# Patient Record
Sex: Female | Born: 1985 | Race: White | Hispanic: No | Marital: Single | Smoking: Never smoker
Health system: Southern US, Community
[De-identification: ages and names within clinical notes are randomized; demographics above are authoritative.]

## PROBLEM LIST (undated history)

## (undated) ENCOUNTER — Inpatient Hospital Stay: Payer: Self-pay

## (undated) ENCOUNTER — Inpatient Hospital Stay (HOSPITAL_COMMUNITY): Payer: Self-pay

## (undated) DIAGNOSIS — R112 Nausea with vomiting, unspecified: Secondary | ICD-10-CM

## (undated) DIAGNOSIS — Z9889 Other specified postprocedural states: Secondary | ICD-10-CM

## (undated) DIAGNOSIS — F909 Attention-deficit hyperactivity disorder, unspecified type: Secondary | ICD-10-CM

## (undated) DIAGNOSIS — D649 Anemia, unspecified: Secondary | ICD-10-CM

## (undated) DIAGNOSIS — Z6731 Type AB blood, Rh negative: Secondary | ICD-10-CM

## (undated) HISTORY — PX: CHOLECYSTECTOMY: SHX55

## (undated) HISTORY — DX: Type AB blood, Rh negative: Z67.31

## (undated) HISTORY — PX: SLEEVE GASTROPLASTY: SHX1101

---
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
## 2003-03-02 ENCOUNTER — Other Ambulatory Visit: Admission: RE | Admit: 2003-03-02 | Discharge: 2003-03-02 | Payer: Self-pay | Admitting: Gynecology

## 2003-05-13 ENCOUNTER — Observation Stay (HOSPITAL_COMMUNITY): Admission: AD | Admit: 2003-05-13 | Discharge: 2003-05-14 | Payer: Self-pay | Admitting: Internal Medicine

## 2003-05-19 ENCOUNTER — Inpatient Hospital Stay (HOSPITAL_COMMUNITY): Admission: AD | Admit: 2003-05-19 | Discharge: 2003-05-19 | Payer: Self-pay | Admitting: Gynecology

## 2004-06-10 ENCOUNTER — Inpatient Hospital Stay (HOSPITAL_COMMUNITY): Admission: AD | Admit: 2004-06-10 | Discharge: 2004-06-10 | Payer: Self-pay | Admitting: Obstetrics & Gynecology

## 2004-06-17 ENCOUNTER — Emergency Department (HOSPITAL_COMMUNITY): Admission: EM | Admit: 2004-06-17 | Discharge: 2004-06-17 | Payer: Self-pay | Admitting: Family Medicine

## 2004-07-09 ENCOUNTER — Other Ambulatory Visit: Admission: RE | Admit: 2004-07-09 | Discharge: 2004-07-09 | Payer: Self-pay | Admitting: Gynecology

## 2004-11-22 ENCOUNTER — Inpatient Hospital Stay (HOSPITAL_COMMUNITY): Admission: AD | Admit: 2004-11-22 | Discharge: 2004-11-22 | Payer: Self-pay | Admitting: Gynecology

## 2004-12-17 ENCOUNTER — Inpatient Hospital Stay (HOSPITAL_COMMUNITY): Admission: AD | Admit: 2004-12-17 | Discharge: 2004-12-19 | Payer: Self-pay | Admitting: Gynecology

## 2005-01-11 HISTORY — PX: CHOLECYSTECTOMY: SHX55

## 2005-01-11 HISTORY — PX: GALLBLADDER SURGERY: SHX652

## 2005-01-28 ENCOUNTER — Encounter (INDEPENDENT_AMBULATORY_CARE_PROVIDER_SITE_OTHER): Payer: Self-pay | Admitting: Specialist

## 2005-01-28 ENCOUNTER — Inpatient Hospital Stay (HOSPITAL_COMMUNITY): Admission: EM | Admit: 2005-01-28 | Discharge: 2005-01-29 | Payer: Self-pay | Admitting: Emergency Medicine

## 2005-02-03 ENCOUNTER — Other Ambulatory Visit: Admission: RE | Admit: 2005-02-03 | Discharge: 2005-02-03 | Payer: Self-pay | Admitting: Gynecology

## 2005-04-14 ENCOUNTER — Other Ambulatory Visit: Admission: RE | Admit: 2005-04-14 | Discharge: 2005-04-14 | Payer: Self-pay | Admitting: Gynecology

## 2006-07-16 IMAGING — US US OB COMP LESS 14 WK
1 series · 14 of 28 positions shown · non-contrast
Comparison: none

CLINICAL DATA: 14 week 2 day gestational age by LMP.  Pelvic pain and cramping.  
OBSTETRICAL ULTRASOUND:  
A single living intrauterine fetus is seen with measured heart rate of 160.  Fetal movement is noted.  Crown rump length measures 5.2 cm, corresponding with a gestational age of 11 weeks 6 days.  A normal appearing yolk sac is seen.  
No maternal uterine abnormalities are seen.  The right ovary is normal in appearance.  The left ovary contains a hemorrhagic corpus luteum cyst measuring 2.7 x 1.8 x 2.4 cm.  There is no evidence of free fluid.

[Series 1: us ob comp less 14 wk · 0.20mm/px · 14 of 48 slices shown]
[im 2/48]
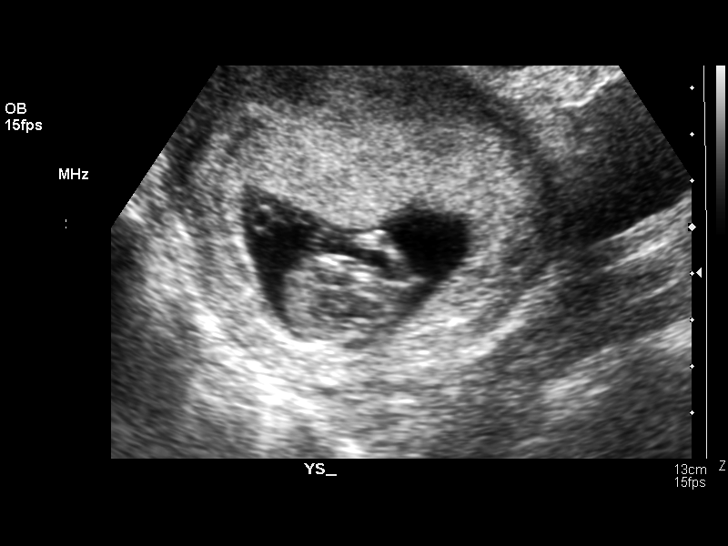
[im 6/48]
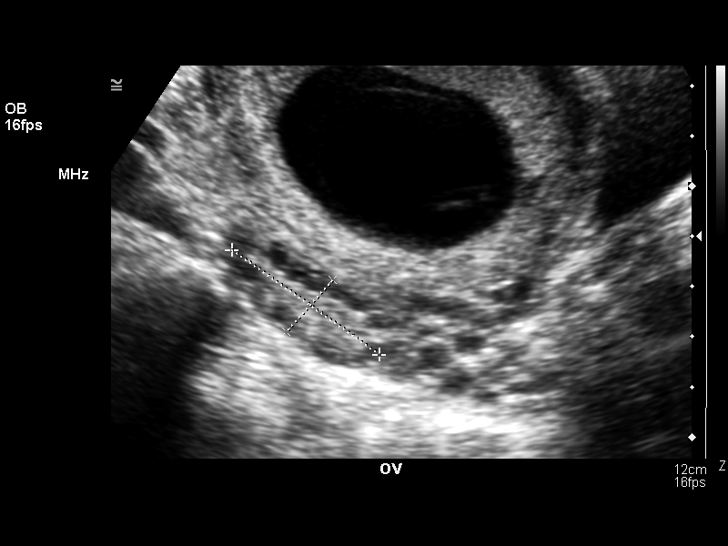
[im 9/48]
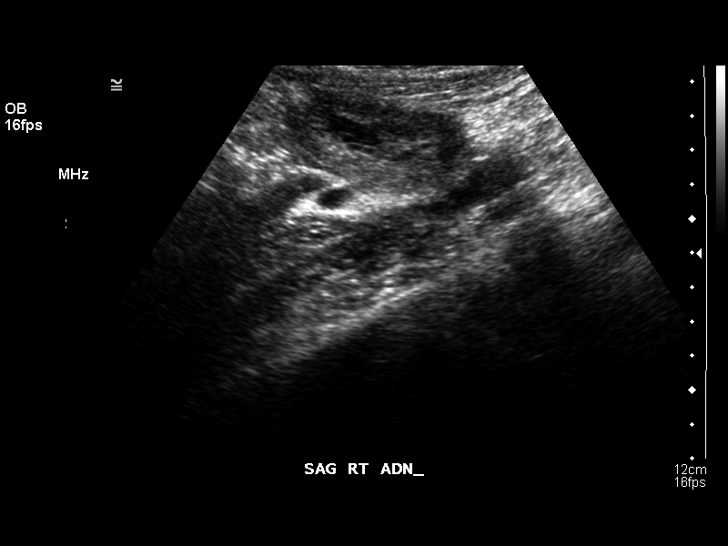
[im 13/48]
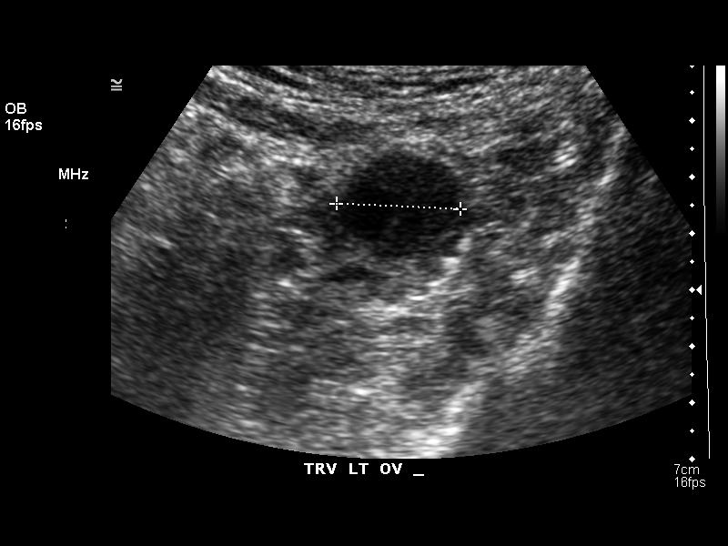
[im 16/48]
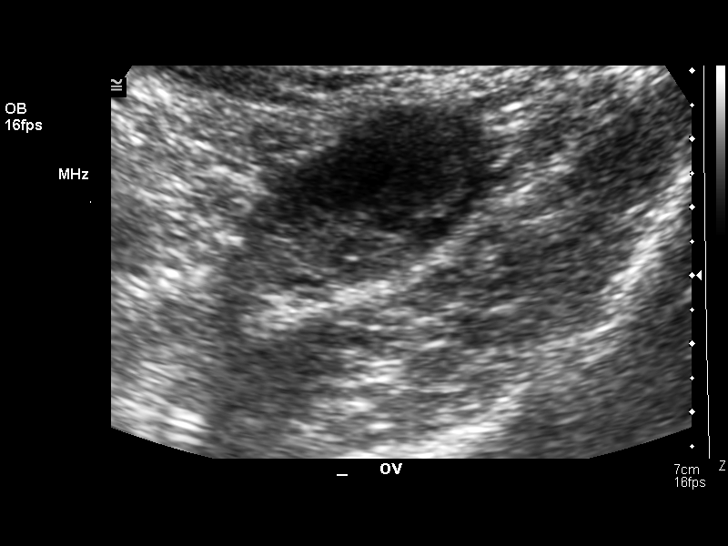
[im 20/48]
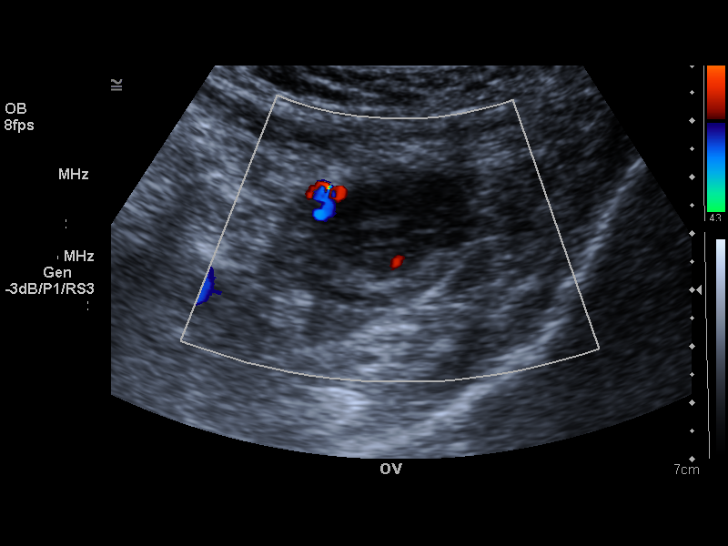
[im 23/48]
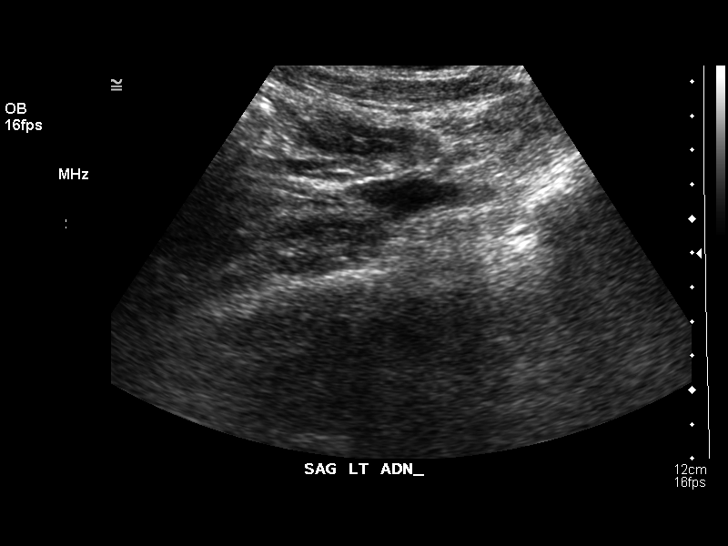
[im 27/48]
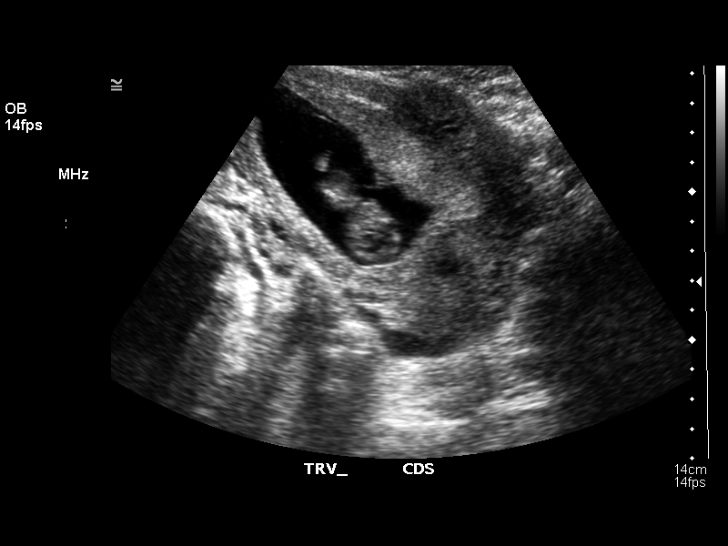
[im 30/48]
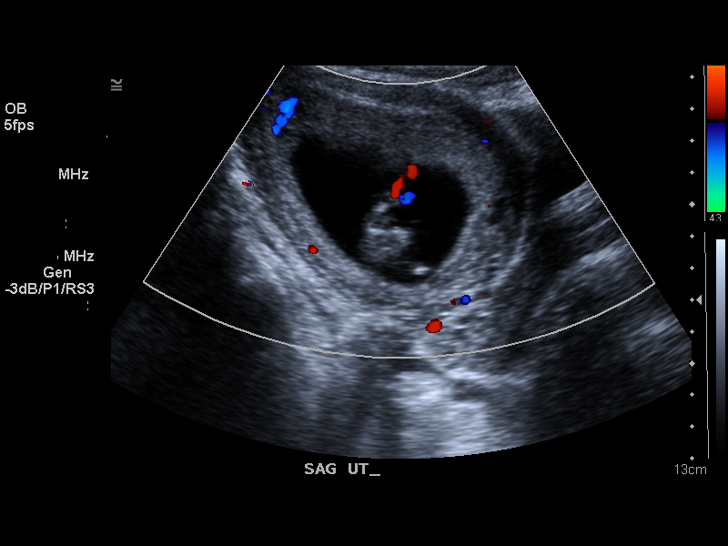
[im 34/48]
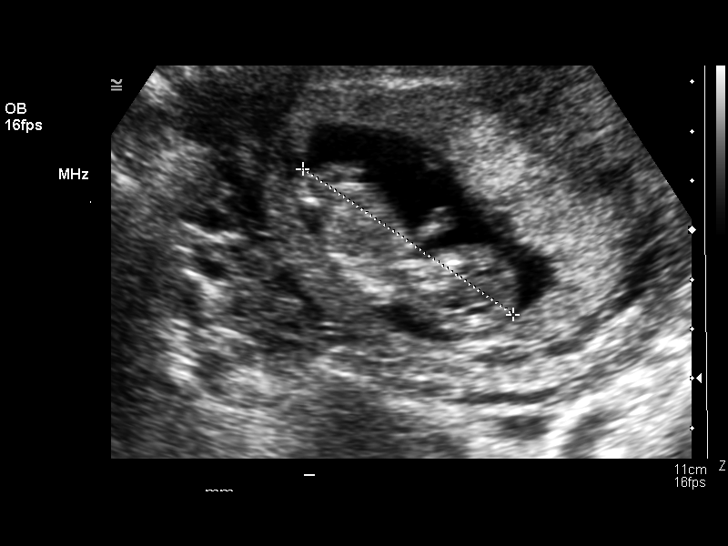
[im 37/48]
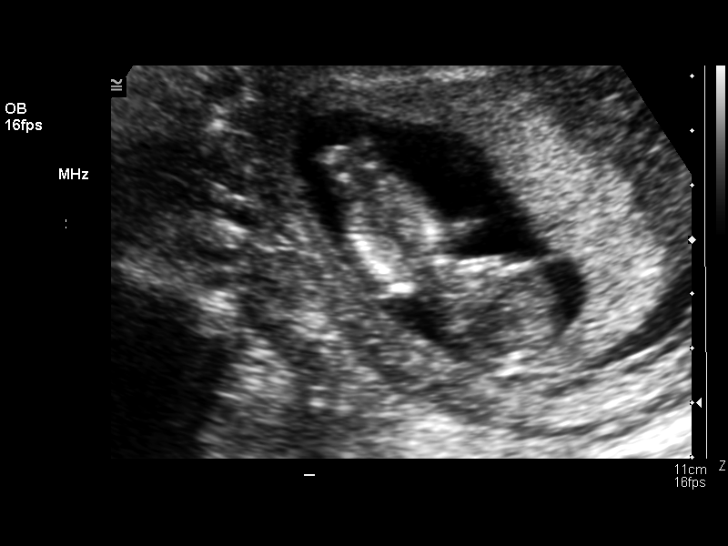
[im 41/48]
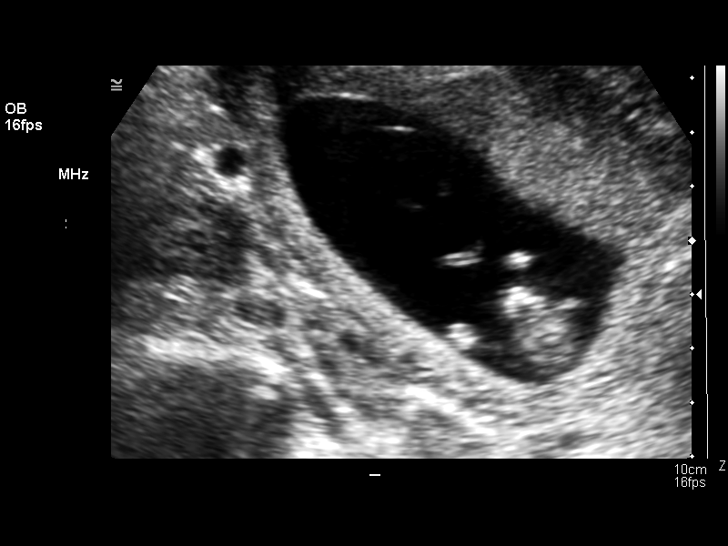
[im 44/48]
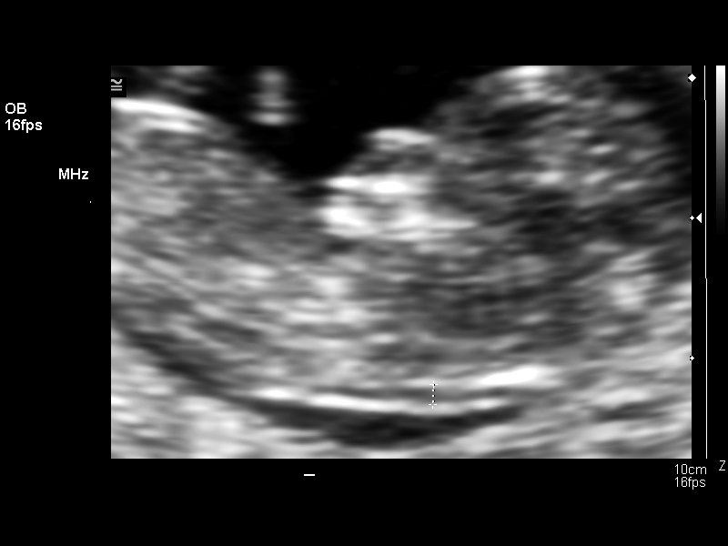
[im 48/48]
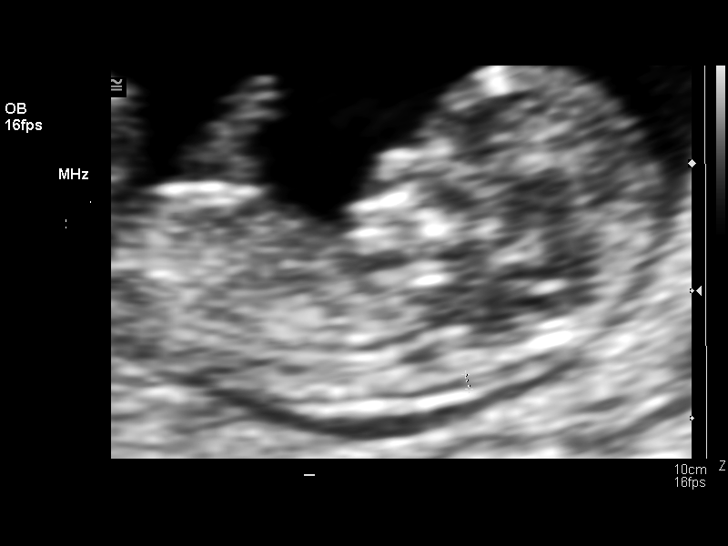

[14 of 28 positions shown; findings below may reference images not displayed]

IMPRESSION: Single living intrauterine fetus with estimated gestational age of 11 weeks 6 days and sonographic EDC of 12/24/04.  This is approximately two weeks less than LMP dates.  
2.7 cm hemorrhagic left ovarian corpus luteum.  No evidence of free fluid.

## 2006-09-25 ENCOUNTER — Emergency Department (HOSPITAL_COMMUNITY): Admission: EM | Admit: 2006-09-25 | Discharge: 2006-09-26 | Payer: Self-pay | Admitting: Emergency Medicine

## 2008-01-28 ENCOUNTER — Emergency Department (HOSPITAL_COMMUNITY): Admission: EM | Admit: 2008-01-28 | Discharge: 2008-01-28 | Payer: Self-pay | Admitting: Emergency Medicine

## 2008-03-18 ENCOUNTER — Inpatient Hospital Stay (HOSPITAL_COMMUNITY): Admission: AD | Admit: 2008-03-18 | Discharge: 2008-03-19 | Payer: Self-pay | Admitting: Family Medicine

## 2008-04-12 ENCOUNTER — Inpatient Hospital Stay (HOSPITAL_COMMUNITY): Admission: AD | Admit: 2008-04-12 | Discharge: 2008-04-12 | Payer: Self-pay | Admitting: Obstetrics & Gynecology

## 2009-04-08 ENCOUNTER — Emergency Department (HOSPITAL_COMMUNITY): Admission: EM | Admit: 2009-04-08 | Discharge: 2009-04-08 | Payer: Self-pay | Admitting: Emergency Medicine

## 2009-07-02 ENCOUNTER — Emergency Department (HOSPITAL_COMMUNITY): Admission: EM | Admit: 2009-07-02 | Discharge: 2009-07-02 | Payer: Self-pay | Admitting: Family Medicine

## 2009-08-31 ENCOUNTER — Emergency Department (HOSPITAL_COMMUNITY): Admission: EM | Admit: 2009-08-31 | Discharge: 2009-08-31 | Payer: Self-pay | Admitting: Emergency Medicine

## 2010-05-22 ENCOUNTER — Other Ambulatory Visit: Admission: RE | Admit: 2010-05-22 | Discharge: 2010-05-22 | Payer: Self-pay | Admitting: Gynecology

## 2010-05-22 ENCOUNTER — Ambulatory Visit: Payer: Self-pay | Admitting: Women's Health

## 2010-08-05 ENCOUNTER — Inpatient Hospital Stay (HOSPITAL_COMMUNITY)
Admission: AD | Admit: 2010-08-05 | Discharge: 2010-08-05 | Payer: Self-pay | Source: Home / Self Care | Attending: Obstetrics & Gynecology | Admitting: Obstetrics & Gynecology

## 2010-08-06 LAB — URINALYSIS, ROUTINE W REFLEX MICROSCOPIC
Bilirubin Urine: NEGATIVE
Ketones, ur: NEGATIVE mg/dL
Nitrite: NEGATIVE
Protein, ur: NEGATIVE mg/dL
Specific Gravity, Urine: 1.015 (ref 1.005–1.030)
Urine Glucose, Fasting: NEGATIVE mg/dL
Urobilinogen, UA: 0.2 mg/dL (ref 0.0–1.0)
pH: 5 (ref 5.0–8.0)

## 2010-08-06 LAB — URINE MICROSCOPIC-ADD ON

## 2010-08-06 LAB — POCT PREGNANCY, URINE: Preg Test, Ur: NEGATIVE

## 2010-08-06 LAB — CBC
HCT: 39.3 % (ref 36.0–46.0)
Hemoglobin: 13.5 g/dL (ref 12.0–15.0)
MCH: 29.9 pg (ref 26.0–34.0)
MCHC: 34.4 g/dL (ref 30.0–36.0)
MCV: 87.1 fL (ref 78.0–100.0)
Platelets: 182 10*3/uL (ref 150–400)
RBC: 4.51 MIL/uL (ref 3.87–5.11)
RDW: 13 % (ref 11.5–15.5)
WBC: 5.8 10*3/uL (ref 4.0–10.5)

## 2010-08-06 LAB — BASIC METABOLIC PANEL WITH GFR
BUN: 7 mg/dL (ref 6–23)
CO2: 28 meq/L (ref 19–32)
Calcium: 9.6 mg/dL (ref 8.4–10.5)
Chloride: 104 meq/L (ref 96–112)
Creatinine, Ser: 0.84 mg/dL (ref 0.4–1.2)
GFR calc non Af Amer: >60 mL/min
Glucose, Bld: 91 mg/dL (ref 70–99)
Potassium: 4.3 meq/L (ref 3.5–5.1)
Sodium: 138 meq/L (ref 135–145)

## 2010-08-06 LAB — WET PREP, GENITAL
Trich, Wet Prep: NONE SEEN
Yeast Wet Prep HPF POC: NONE SEEN

## 2010-08-06 LAB — HCG, QUANTITATIVE, PREGNANCY: hCG, Beta Chain, Quant, S: <2 m[IU]/mL (ref <5)

## 2010-08-07 LAB — GC/CHLAMYDIA PROBE AMP, GENITAL
Chlamydia, DNA Probe: NEGATIVE
GC Probe Amp, Genital: NEGATIVE

## 2010-10-14 LAB — POCT PREGNANCY, URINE: Preg Test, Ur: NEGATIVE

## 2010-11-29 NOTE — Discharge Summary (Signed)
NAME:  Paula Sullivan, Paula Sullivan                           ACCOUNT NO.:  [id number]   MEDICAL RECORD NO.:  [id number]                   PATIENT TYPE:  OBV   LOCATION:  9151                                 FACILITY:  [facility]   PHYSICIAN:  Juan H. Lily Peer, M.D.             DATE OF BIRTH:  1986/04/12   DATE OF ADMISSION:  05/13/2003  DATE OF DISCHARGE:  05/14/2003                                 DISCHARGE SUMMARY   DISCHARGE DIAGNOSES:  1. Intrauterine pregnancy 26+ weeks, undelivered.  2. Fall.   HISTORY:  This is a 25 year old female gravida 1, para 0 with an EDC of  August 16, 2003 who presented to triage after falling nine steps and landed  on her abdomen on date of admission.   HOSPITAL COURSE:  On May 13, 2003 patient was assessed for 26 weeks at  fall.  CMET within normal limits.  UA within normal limits.  Ultrasound  revealed IUP, no abruption, cervical length 3.2 cm, AFI within normal  limits, breech position.  Kleihauer-Betke was negative.  The patient was  admitted and placed on continuous monitoring for contractions.  On the  following a.m. Kleihauer-Betke again was repeated which was negative.  Ultrasound was stable and patient was felt at that time stable for discharge  and follow-up with routine OB care.  If she had any problems prior to this  follow-up visit, obviously, she is to call the office.     Susa Loffler, P.A.                    Juan H. Lily Peer, M.D.    TSG/MEDQ  D:  06/05/2003  T:  06/05/2003  Job:  [id number]
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
## 2010-11-29 NOTE — Discharge Summary (Signed)
[name], [name]                 ACCOUNT NO.:  [id number]   MEDICAL RECORD NO.:  [id number]          PATIENT TYPE:  INP   LOCATION:  6153                         FACILITY:  [facility]   PHYSICIAN:  Thornton Park. Daphine Deutscher, MD  DATE OF BIRTH:  [birth year]   DATE OF ADMISSION:  01/28/2005  DATE OF DISCHARGE:  01/29/2005                                 DISCHARGE SUMMARY   DISCHARGE DIAGNOSES:  1.  Acute cholecystitis, status post laparoscopic cholecystectomy on January 28, 2005.  2.  Five weeks postpartum with natural delivery.   HOSPITAL COURSE:  [name] is a 25 year old female who is 5 weeks  postpartum who awoke at 2 a.m. on the day of admission with right upper  quadrant pain radiating into the right scapular region.  She denies any  previous pain, no nausea, vomiting or diarrhea.  She was seen in the  emergency room and her white count was normal, however, gallbladder  ultrasound revealed cholelithiasis with positive Murphy sign and scan was  consistent with acute cholecystitis.   She then underwent a laparoscopic cholecystectomy under the care of Dr. Wenda Low.  She tolerated the procedure well and was taken back to the room in  stable condition.  On postop day #1, she was ready for discharge to home.  She was eating, urinating and able to ambulate without difficulty.   DISCHARGE MEDICATIONS:  Vicodin as needed for pain.   FOLLOW UP:  Follow up with Dr. Daphine Deutscher in 2 weeks.   ACTIVITY:  She is not to lift over 10 pounds x2 weeks.  She may  shower/bathe.   SPECIAL INSTRUCTIONS:  She is to call for any questions or concerns.      Guy Franco, P.A.      Thornton Park Daphine Deutscher, MD  Electronically Signed    LB/MEDQ  D:  04/11/2005  T:  04/11/2005  Job:  [id number]   cc:   Ivor Costa. Farrel Gobble, M.D.  Fax: [phone]

## 2010-11-29 NOTE — H&P (Signed)
Paula Sullivan, Paula Sullivan                 ACCOUNT NO.:  [id number]   MEDICAL RECORD NO.:  [id number]          PATIENT TYPE:  INP   LOCATION:  1827                         FACILITY:  [facility]   PHYSICIAN:  Thornton Park. Daphine Deutscher, MD  DATE OF BIRTH:  07/30/1985   DATE OF ADMISSION:  01/28/2005  DATE OF DISCHARGE:                                HISTORY & PHYSICAL   CHIEF COMPLAINT:  Right upper quadrant pain.   Paula Sullivan is a 25 year old female who is five weeks postpartum who awoke at  2 a.m. today with right upper quadrant pain radiating into the right  scapular region.  She has not had pain similar to this prior to this  admission.  She denies any nausea, vomiting, diarrhea, fever, or chills.  Several weeks ago she did experience nausea, vomiting, diarrhea which sounds  like gastroenteritis because her whole family experienced the same illness.  Please note, she is five weeks postpartum.  She had a natural delivery and  is not breast-feeding.  She is currently on her menses.   FAMILY HISTORY:  Mom and dad are both alive and both have diabetes mellitus.   REVIEW OF SYSTEMS:  As above, otherwise negative.   PHYSICAL EXAMINATION:  VITAL SIGNS:  Temperature 98.1, pulse 60,  respirations 18, blood pressure 102/77.  HEENT:  Grossly normal.  NECK:  No carotid or subclavian bruits, JVD, or thyromegaly.  CHEST:  Clear to auscultation bilaterally.  No wheezing or rhonchi.  HEART:  Regular rate and rhythm.  No gross murmur.  ABDOMEN:  Right upper quadrant with positive Murphy's sign.  EXTREMITIES:  No peripheral edema.  SKIN:  Warm and dry.   LABORATORIES/X-RAY:  White count is 5.3.  BMET is unremarkable.  Lipase is  59.  Urinalysis shows hematuria but she is currently on her menses.  Ultrasound shows cholelithiasis with a positive Murphy's sign consistent  with cholelithiasis.   ASSESSMENT:  Acute cholecystitis.   PLAN:  Laparoscopic cholecystectomy with intraoperative cholangiogram per  Dr.  Luretha Murphy.  The patient will be taken to the operating room today.       LB/MEDQ  D:  01/28/2005  T:  01/28/2005  Job:  [id number]

## 2010-11-29 NOTE — Op Note (Signed)
NAMECLAUDINA, OLIPHANT                 ACCOUNT NO.:  [id number]   MEDICAL RECORD NO.:  [id number]          PATIENT TYPE:  INP   LOCATION:  6153                         FACILITY:  [facility]   PHYSICIAN:  Thornton Park. Daphine Deutscher, MD  DATE OF BIRTH:  1986-04-22   DATE OF PROCEDURE:  01/28/2005  DATE OF DISCHARGE:                                 OPERATIVE REPORT   PREOPERATIVE DIAGNOSIS:  Acute cholecystitis.   POSTOPERATIVE DIAGNOSIS:  Acute and subacute cholecystitis.   PROCEDURE:  Laparoscopic cholecystectomy, intraoperative cholangiogram.   SURGEON:  Thornton Park. Daphine Deutscher, M.D.   ASSISTANT:  Sandria Bales. Ezzard Standing, M.D.   ANESTHESIA:  General endotracheal.   DESCRIPTION OF PROCEDURE:  Nimco Bivens is a 25 year old, recent postpartum,  with abdominal pain and gallstones.  She was taken to OR 5 on the afternoon  of January 28, 2005 and given general anesthesia.  The area was prepped with  Betadine and draped sterilely.  Informed consent was obtained preop  regarding complications not limited to common bile duct injury.  Through the  umbilicus, an Hasson was inserted without difficulty and the abdomen was  insufflated.  Three trocars were placed in the upper abdomen.  The  gallbladder was densely adherent to the omentum, which walled it off.  I had  to strip this away and use a harmonic for taking this down.  I then the  grasped the gallbladder, elevated it, carefully dissected Calot's triangle,  put a clip upon the gallbladder, incised the cystic duct and had a little  difficulty getting into it, as there was a valve, but I eventually got into  it.  A dynamic cholangiogram showed no really prompt flow into the duodenum,  but good filling of the intrahepatic radicals and no obvious stones were  seen.  There was a right hepatic branch that seemed to come off with the  cystic duct.  I then triple-clipped the cystic duct, divided it and triple-  clipped the cystic artery divided it and removed the gallbladder  from the  gallbladder bed with the Harmonic scalpel.  It was placed in a bag and  brought out through the umbilicus.  The gallbladder bed was inspected and no  bleeding was noted.  All the ports were injected with  0.5% Marcaine.  The  umbilical port was repaired using the pursestring suture was tied down and  occluded that.  The wounds were closed with 4-0 Vicryl, Benzoin and Steri-  Strips.  The patient seemed to tolerate the procedure well and was taken to  the recovery room in satisfactory condition.       MBM/MEDQ  D:  01/28/2005  T:  01/29/2005  Job:  [id number]
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
## 2011-03-20 ENCOUNTER — Emergency Department: Payer: Self-pay | Admitting: Emergency Medicine

## 2011-04-14 LAB — URINALYSIS, ROUTINE W REFLEX MICROSCOPIC
Glucose, UA: NEGATIVE
Hgb urine dipstick: NEGATIVE
Ketones, ur: NEGATIVE
Nitrite: NEGATIVE
Protein, ur: NEGATIVE
Specific Gravity, Urine: 1.025
Urobilinogen, UA: 0.2
pH: 6

## 2011-04-14 LAB — URINE MICROSCOPIC-ADD ON

## 2011-04-14 LAB — WET PREP, GENITAL: Yeast Wet Prep HPF POC: NONE SEEN

## 2011-04-14 LAB — URINE CULTURE: Culture: NO GROWTH

## 2011-04-14 LAB — GC/CHLAMYDIA PROBE AMP, GENITAL
Chlamydia, DNA Probe: POSITIVE — AB
GC Probe Amp, Genital: NEGATIVE

## 2011-04-16 LAB — WET PREP, GENITAL
Trich, Wet Prep: NONE SEEN
Yeast Wet Prep HPF POC: NONE SEEN

## 2011-04-16 LAB — URINALYSIS, ROUTINE W REFLEX MICROSCOPIC
Bilirubin Urine: NEGATIVE
Glucose, UA: NEGATIVE
Hgb urine dipstick: NEGATIVE
Ketones, ur: NEGATIVE
Nitrite: NEGATIVE
Protein, ur: NEGATIVE
Specific Gravity, Urine: >1.03 — ABNORMAL HIGH
Urobilinogen, UA: 0.2
pH: 5.5

## 2011-04-16 LAB — CBC
HCT: 41.1
Hemoglobin: 14.1
MCHC: 34.2
MCV: 91.7
Platelets: 215
RBC: 4.48
RDW: 12.4
WBC: 8.1

## 2011-04-16 LAB — HCG, QUANTITATIVE, PREGNANCY: hCG, Beta Chain, Quant, S: 79 — ABNORMAL HIGH

## 2011-10-23 ENCOUNTER — Encounter: Payer: Self-pay | Admitting: Women's Health

## 2011-11-05 ENCOUNTER — Other Ambulatory Visit: Payer: Self-pay

## 2013-04-12 ENCOUNTER — Encounter: Payer: Self-pay | Admitting: Gastroenterology

## 2013-04-29 ENCOUNTER — Ambulatory Visit: Payer: Self-pay | Admitting: Gastroenterology

## 2013-09-16 ENCOUNTER — Ambulatory Visit: Payer: Self-pay | Admitting: Physician Assistant

## 2013-09-21 ENCOUNTER — Encounter (HOSPITAL_COMMUNITY): Payer: Self-pay | Admitting: Emergency Medicine

## 2013-09-21 ENCOUNTER — Emergency Department (HOSPITAL_COMMUNITY)
Admission: EM | Admit: 2013-09-21 | Discharge: 2013-09-21 | Disposition: A | Discharge status: Home / Self Care | Payer: No Typology Code available for payment source | Attending: Emergency Medicine | Admitting: Emergency Medicine

## 2013-09-21 DIAGNOSIS — R0789 Other chest pain: Secondary | ICD-10-CM | POA: Insufficient documentation

## 2013-09-21 DIAGNOSIS — IMO0001 Reserved for inherently not codable concepts without codable children: Secondary | ICD-10-CM | POA: Insufficient documentation

## 2013-09-21 DIAGNOSIS — R51 Headache: Secondary | ICD-10-CM | POA: Insufficient documentation

## 2013-09-21 DIAGNOSIS — R509 Fever, unspecified: Secondary | ICD-10-CM | POA: Insufficient documentation

## 2013-09-21 DIAGNOSIS — R0602 Shortness of breath: Secondary | ICD-10-CM | POA: Insufficient documentation

## 2013-09-21 DIAGNOSIS — Z880 Allergy status to penicillin: Secondary | ICD-10-CM | POA: Insufficient documentation

## 2013-09-21 DIAGNOSIS — R059 Cough, unspecified: Secondary | ICD-10-CM | POA: Insufficient documentation

## 2013-09-21 DIAGNOSIS — R0989 Other specified symptoms and signs involving the circulatory and respiratory systems: Secondary | ICD-10-CM | POA: Insufficient documentation

## 2013-09-21 DIAGNOSIS — R05 Cough: Secondary | ICD-10-CM | POA: Insufficient documentation

## 2013-09-21 DIAGNOSIS — H9209 Otalgia, unspecified ear: Secondary | ICD-10-CM | POA: Insufficient documentation

## 2013-09-21 DIAGNOSIS — J391 Other abscess of pharynx: Secondary | ICD-10-CM | POA: Insufficient documentation

## 2013-09-21 DIAGNOSIS — R110 Nausea: Secondary | ICD-10-CM | POA: Insufficient documentation

## 2013-09-21 DIAGNOSIS — R197 Diarrhea, unspecified: Secondary | ICD-10-CM | POA: Insufficient documentation

## 2013-09-21 DIAGNOSIS — Z91040 Latex allergy status: Secondary | ICD-10-CM | POA: Insufficient documentation

## 2013-09-21 DIAGNOSIS — J069 Acute upper respiratory infection, unspecified: Secondary | ICD-10-CM

## 2013-09-21 DIAGNOSIS — R42 Dizziness and giddiness: Secondary | ICD-10-CM | POA: Insufficient documentation

## 2013-09-21 MED ORDER — ALBUTEROL SULFATE HFA 108 (90 BASE) MCG/ACT IN AERS
2.0000 | INHALATION_SPRAY | RESPIRATORY_TRACT | Status: DC | PRN
  Administered 2013-09-21: 2 via RESPIRATORY_TRACT
  Filled 2013-09-21: qty 6.7

## 2013-09-21 NOTE — ED Notes (Signed)
Pt alert, arrives from home, c/o re current sinus infection, recently treated with z pack, no relief, resp even unlabored, skin pwd

## 2013-09-21 NOTE — ED Provider Notes (Signed)
CSN: [id number]     Arrival date & time 09/21/13  1307 History  This chart was scribed for non-physician practitioner Elpidio AnisShari Jessamine Barcia working with Rolland PorterMark James, MD by Elveria Risingimelie Horne, ED Scribe. This patient was seen in room WTR7/WTR7 and the patient's care was started at 1:46 PM.   Chief Complaint  Patient presents with  . URI     (Consider location/radiation/quality/duration/timing/severity/associated sxs/prior Treatment) The history is provided by the patient. No language interpreter was used.  HPI Comments: Paula Sullivan is a 28 y.o. female who presents to the Emergency Department complaining of URI symptoms that presented as sinus pressure and has gradually worsened. The patient reports nonproductive cough, chest and nasal congestion which has endured for 4 weeks. A week ago, the patient visited her doctor and was diagnosed with sinusitis and given a Z pack. The patient finished the Z pack 09/14/13, but reports that her symptoms have worsened. Patient reports intermittent fever which was measured at 102F at its maximum, and has remained between 99-101F throughout the week. Today the patient reports dry, burning cough, headache, lightheadedness, chest tightness, myalgia, diarrhea, mild ear pain, and some nausea. Patient reports that in addition to her prescribed Z-pack she has only treated her symptoms with NyQuil to allow her to sleep at night. Patient denies vomiting, constipation and sore throat. Patient shares that she has been eating and drinking normally.   Past Medical History  Diagnosis Date  . Type ab blood, rh negative    Past Surgical History  Procedure Laterality Date  . Gallbladder surgery  7-06   Family History  Problem Relation Age of Onset  . Suicidality Father     2011  . Diabetes Maternal Grandfather    History  Substance Use Topics  . Smoking status: Never Smoker   . Smokeless tobacco: Not on file  . Alcohol Use: Yes     Comment: SOCIALLY   OB History   Grav Para  Term Preterm Abortions TAB SAB Ect Mult Living   2 2             Review of Systems  Constitutional: Positive for fever.  HENT: Positive for congestion, ear pain and sinus pressure. Negative for sore throat.   Respiratory: Positive for cough, chest tightness and shortness of breath.   Gastrointestinal: Positive for nausea and diarrhea. Negative for vomiting and constipation.  Musculoskeletal: Positive for myalgias.  Neurological: Positive for light-headedness and headaches.      Allergies  Latex and Penicillins  Home Medications   Current Outpatient Rx  Name  Route  Sig  Dispense  Refill  . amphetamine-dextroamphetamine (ADDERALL XR) 20 MG 24 hr capsule   Oral   Take 20 mg by mouth daily.          BP 139/87  Pulse 82  Temp(Src) 99.4 F (37.4 C) (Oral)  Resp 16  Wt 200 lb (90.719 kg)  SpO2 99%  LMP 08/24/2013 Physical Exam  Nursing note and vitals reviewed. Constitutional: She is oriented to person, place, and time. She appears well-developed and well-nourished. No distress.  HENT:  Head: Normocephalic and atraumatic.  Bogginess to frontal and maxillary sinuses. Pressure felt with palpation.   Eyes: EOM are normal.  Neck: Neck supple. No tracheal deviation present.  Cardiovascular: Normal rate.   Pulmonary/Chest: Effort normal. No respiratory distress.  Musculoskeletal: Normal range of motion.  Neurological: She is alert and oriented to person, place, and time.  Skin: Skin is warm and dry.  Psychiatric: She has  a normal mood and affect. Her behavior is normal.    ED Course  Procedures (including critical care time) DIAGNOSTIC STUDIES: Oxygen Saturation is 99% on room air, normal by my interpretation.    COORDINATION OF CARE: 1:52 PM- Pt advised of plan for treatment and pt agrees.    Labs Review Labs Reviewed - No data to display Imaging Review No results found.   EKG Interpretation None      MDM   Final diagnoses:  None    1. Cough  She  appears stable, well, non-toxic. Suspect viral process given recent Z-pack and normal exam. Stable for discharge.   I personally performed the services described in this documentation, which was scribed in my presence. The recorded information has been reviewed and is accurate.     Arnoldo Hooker, PA-C 09/24/13 [id number]

## 2013-09-21 NOTE — Discharge Instructions (Signed)
Cough, Adult ° A cough is a reflex that helps clear your throat and airways. It can help heal the body or may be a reaction to an irritated airway. A cough may only last 2 or 3 weeks (acute) or may last more than 8 weeks (chronic).  °CAUSES °Acute cough: °· Viral or bacterial infections. °Chronic cough: °· Infections. °· Allergies. °· Asthma. °· Post-nasal drip. °· Smoking. °· Heartburn or acid reflux. °· Some medicines. °· Chronic lung problems (COPD). °· Cancer. °SYMPTOMS  °· Cough. °· Fever. °· Chest pain. °· Increased breathing rate. °· High-pitched whistling sound when breathing (wheezing). °· Colored mucus that you cough up (sputum). °TREATMENT  °· A bacterial cough may be treated with antibiotic medicine. °· A viral cough must run its course and will not respond to antibiotics. °· Your caregiver may recommend other treatments if you have a chronic cough. °HOME CARE INSTRUCTIONS  °· Only take over-the-counter or prescription medicines for pain, discomfort, or fever as directed by your caregiver. Use cough suppressants only as directed by your caregiver. °· Use a cold steam vaporizer or humidifier in your bedroom or home to help loosen secretions. °· Sleep in a semi-upright position if your cough is worse at night. °· Rest as needed. °· Stop smoking if you smoke. °SEEK IMMEDIATE MEDICAL CARE IF:  °· You have pus in your sputum. °· Your cough starts to worsen. °· You cannot control your cough with suppressants and are losing sleep. °· You begin coughing up blood. °· You have difficulty breathing. °· You develop pain which is getting worse or is uncontrolled with medicine. °· You have a fever. °MAKE SURE YOU:  °· Understand these instructions. °· Will watch your condition. °· Will get help right away if you are not doing well or get worse. °Document Released: 12/27/2010 Document Revised: 09/22/2011 Document Reviewed: 12/27/2010 °Exit[employer]® Patient Information ©2014 [employer]. ° °Upper Respiratory Infection,  Adult °An upper respiratory infection (URI) is also known as the common cold. It is often caused by a type of germ (virus). Colds are easily spread (contagious). You can pass it to others by kissing, coughing, sneezing, or drinking out of the same glass. Usually, you get better in 1 or 2 weeks.  °HOME CARE  °· Only take medicine as told by your doctor. °· Use a warm mist humidifier or breathe in steam from a hot shower. °· Drink enough water and fluids to keep your pee (urine) clear or pale yellow. °· Get plenty of rest. °· Return to work when your temperature is back to normal or as told by your doctor. You may use a face mask and wash your hands to stop your cold from spreading. °GET HELP RIGHT AWAY IF:  °· After the first few days, you feel you are getting worse. °· You have questions about your medicine. °· You have chills, shortness of breath, or brown or red spit (mucus). °· You have yellow or brown snot (nasal discharge) or pain in the face, especially when you bend forward. °· You have a fever, puffy (swollen) neck, pain when you swallow, or white spots in the back of your throat. °· You have a bad headache, ear pain, sinus pain, or chest pain. °· You have a high-pitched whistling sound when you breathe in and out (wheezing). °· You have a lasting cough or cough up blood. °· You have sore muscles or a stiff neck. °MAKE SURE YOU:  °· Understand these instructions. °· Will watch your   condition. °· Will get help right away if you are not doing well or get worse. °Document Released: 12/17/2007 Document Revised: 09/22/2011 Document Reviewed: 11/04/2010 °[employer]® Patient Information ©2014 [employer]. ° °

## 2013-09-24 NOTE — ED Provider Notes (Signed)
Medical screening examination/treatment/procedure(s) were performed by non-physician practitioner and as supervising physician I was immediately available for consultation/collaboration.   EKG Interpretation None        Keilani Terrance, MD 09/24/13 0745 

## 2014-05-15 ENCOUNTER — Encounter (HOSPITAL_COMMUNITY): Payer: Self-pay | Admitting: Emergency Medicine

## 2014-05-17 ENCOUNTER — Encounter (HOSPITAL_COMMUNITY): Payer: Self-pay

## 2014-05-17 ENCOUNTER — Emergency Department (HOSPITAL_COMMUNITY)
Admission: EM | Admit: 2014-05-17 | Discharge: 2014-05-17 | Disposition: A | Discharge status: Home / Self Care | Payer: No Typology Code available for payment source | Attending: Emergency Medicine | Admitting: Emergency Medicine

## 2014-05-17 DIAGNOSIS — Z79899 Other long term (current) drug therapy: Secondary | ICD-10-CM | POA: Insufficient documentation

## 2014-05-17 DIAGNOSIS — Z880 Allergy status to penicillin: Secondary | ICD-10-CM | POA: Diagnosis not present

## 2014-05-17 DIAGNOSIS — F419 Anxiety disorder, unspecified: Secondary | ICD-10-CM

## 2014-05-17 DIAGNOSIS — F410 Panic disorder [episodic paroxysmal anxiety] without agoraphobia: Secondary | ICD-10-CM | POA: Insufficient documentation

## 2014-05-17 DIAGNOSIS — Z91040 Latex allergy status: Secondary | ICD-10-CM | POA: Insufficient documentation

## 2014-05-17 DIAGNOSIS — F449 Dissociative and conversion disorder, unspecified: Secondary | ICD-10-CM | POA: Diagnosis present

## 2014-05-17 MED ORDER — ALPRAZOLAM 0.5 MG PO TABS: 0.5000 mg | ORAL_TABLET | Freq: Three times a day (TID) | ORAL | Status: DC | PRN

## 2014-05-17 NOTE — ED Provider Notes (Signed)
CSN: [id number]     Arrival date & time 05/17/14  1344 History  This chart was scribed for non-physician practitioner working with Shon Batonourtney F Horton, MD by Richarda Overlieichard Holland, ED Scribe. This patient was seen in room [facility] and the patient's care was started at 2:04 PM.  Chief Complaint  Patient presents with  . Anxiety    The history is provided by the patient. No language interpreter was used.   HPI Comments: Paula Sullivan is a 28 y.o. female who presents to the Emergency Department complaining of worsening anxiety the past few days. Pt reports she has had "alot of stuff go on the past few days" and holds all of her feelings in. States everything is just building up inside her and at work today she could not hold it in anymore. Pt reports she feels alone and has possibly been depressed. She says that she lives with her boyfriend and feels safe at home, however she is scared he may leave her sometime. She reports her father committed suicide a few years ago and she does not have contact with her mother. She states she feels alone. Pt denies suicidal and homicidal ideations. She denies drug and alcohol use. 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  PCP FULLER   Past Medical History  Diagnosis Date  . Type ab blood, rh negative    Past Surgical History  Procedure Laterality Date  . Gallbladder surgery  7-06  . Cholecystectomy     Family History  Problem Relation Age of Onset  . Suicidality Father     2011  . Diabetes Maternal Grandfather    History  Substance Use Topics  . Smoking status: Never Smoker   . Smokeless tobacco: Not on file  . Alcohol Use: Yes     Comment: SOCIALLY   OB History    Gravida Para Term Preterm AB TAB SAB Ectopic Multiple Living   2 2             Review of Systems  Psychiatric/Behavioral: The patient is nervous/anxious.   All other systems reviewed and are negative.   Allergies  Latex and Penicillins  Home  Medications   Prior to Admission medications   Medication Sig Start Date End Date Taking? Authorizing Provider  ALPRAZolam Prudy Feeler(XANAX) 0.5 MG tablet Take 1 tablet (0.5 mg total) by mouth every 8 (eight) hours as needed for anxiety. 05/17/14   Addyson Traub M Tevis Dunavan, PA-C  amphetamine-dextroamphetamine (ADDERALL XR) 20 MG 24 hr capsule Take 20 mg by mouth daily.    Historical Provider, MD   BP 135/81 mmHg  Pulse 91  Temp(Src) 98.7 F (37.1 C) (Oral)  Resp 16  SpO2 100%  LMP 04/17/2014   Physical Exam  Constitutional: She is oriented to person, place, and time. She appears well-developed and well-nourished. No distress.  HENT:  Head: Normocephalic and atraumatic.  Mouth/Throat: Oropharynx is clear and moist.  Eyes: Conjunctivae and EOM are normal.  Neck: Normal range of motion. Neck supple.  Cardiovascular: Normal rate, regular rhythm and normal heart sounds.   Pulmonary/Chest: Effort normal and breath sounds normal. No respiratory distress.  Musculoskeletal: Normal range of motion. She exhibits no edema.  Neurological: She is alert and oriented to person, place, and time. No sensory deficit.  Skin: Skin is warm and dry.  Psychiatric: Her behavior is normal. Her mood appears anxious. She expresses no homicidal and no suicidal ideation.  Tearful.  Nursing note and vitals reviewed.  ED Course  Procedures  DIAGNOSTIC STUDIES: Oxygen Saturation is 100% on RA, normal by my interpretation.    COORDINATION OF CARE: 2:11 PM Discussed treatment plan with pt at bedside and pt agreed to plan.   Labs Review Labs Reviewed - No data to display  Imaging Review No results found.   EKG Interpretation None      MDM   Final diagnoses:  Panic attack  Anxiety   Pt presenting with anxiety, panic attack. NAD. VSS. Tearful on exam. No SI/HI. She feels safe at home. No hx of anxiety or depression. She has a PCP who she will f/u with. Resources given for therapists. Will d/c with short course of  Xanax for panic. Stable for d/c. Return precautions given. Patient states understanding of treatment care plan and is agreeable.  Discussed with attending Dr. Wilkie AyeHorton who agrees with plan of care.   I personally performed the services described in this documentation, which was scribed in my presence. The recorded information has been reviewed and is accurate.     Kathrynn SpeedRobyn M Bengie Kaucher, PA-C 05/17/14 1423  Shon Batonourtney F Horton, MD 05/18/14 [phone]

## 2014-05-17 NOTE — ED Notes (Signed)
Per pt, she feels alone.  Has boyfriend.  No family for support.  Having issues with boyfriend.  Depressed.  Denies explicit thought or action of suicide but has "ran across her mind".  Pt normally takes adderall but has not taken in 2 months or so.  Does work.  Drama at work.  No meds at this time.

## 2014-05-17 NOTE — Discharge Instructions (Signed)
Take xanax as directed for severe anxiety. Follow up with your primary care doctor to further discuss your anxiety. You may also use the resource list given to find a therapist.  Panic Attacks Panic attacks are sudden, short-livedsurges of severe anxiety, fear, or discomfort. They may occur for no reason when you are relaxed, when you are anxious, or when you are sleeping. Panic attacks may occur for a number of reasons:   Healthy people occasionally have panic attacks in extreme, life-threatening situations, such as war or natural disasters. Normal anxiety is a protective mechanism of the body that helps us react to danger (fight or flight response).  Panic attacks are often seen with anxiety disorders, such as panic disorder, social anxiety disorder, generalized anxiety disorder, and phobias. Anxiety disorders cause excessive or uncontrollable anxiety. They may interfere with your relationships or other life activities.  Panic attacks are sometimes seen with other mental illnesses, such as depression and posttraumatic stress disorder.  Certain medical conditions, prescription medicines, and drugs of abuse can cause panic attacks. SYMPTOMS  Panic attacks start suddenly, peak within 20 minutes, and are accompanied by four or more of the following symptoms:  Pounding heart or fast heart rate (palpitations).  Sweating.  Trembling or shaking.  Shortness of breath or feeling smothered.  Feeling choked.  Chest pain or discomfort.  Nausea or strange feeling in your stomach.  Dizziness, light-headedness, or feeling like you will faint.  Chills or hot flushes.  Numbness or tingling in your lips or hands and feet.  Feeling that things are not real or feeling that you are not yourself.  Fear of losing control or going crazy.  Fear of dying. Some of these symptoms can mimic serious medical conditions. For example, you may think you are having a heart attack. Although panic attacks can  be very scary, they are not life threatening. DIAGNOSIS  Panic attacks are diagnosed through an assessment by your health care provider. Your health care provider will ask questions about your symptoms, such as where and when they occurred. Your health care provider will also ask about your medical history and use of alcohol and drugs, including prescription medicines. Your health care provider may order blood tests or other studies to rule out a serious medical condition. Your health care provider may refer you to a mental health professional for further evaluation. TREATMENT   Most healthy people who have one or two panic attacks in an extreme, life-threatening situation will not require treatment.  The treatment for panic attacks associated with anxiety disorders or other mental illness typically involves counseling with a mental health professional, medicine, or a combination of both. Your health care provider will help determine what treatment is best for you.  Panic attacks due to physical illness usually go away with treatment of the illness. If prescription medicine is causing panic attacks, talk with your health care provider about stopping the medicine, decreasing the dose, or substituting another medicine.  Panic attacks due to alcohol or drug abuse go away with abstinence. Some adults need professional help in order to stop drinking or using drugs. HOME CARE INSTRUCTIONS   Take all medicines as directed by your health care provider.   Schedule and attend follow-up visits as directed by your health care provider. It is important to keep all your appointments. SEEK MEDICAL CARE IF:  You are not able to take your medicines as prescribed.  Your symptoms do not improve or get worse. SEEK IMMEDIATE MEDICAL CARE IF:  You experience panic attack symptoms that are different than your usual symptoms.  You have serious thoughts about hurting yourself or others.  You are taking medicine  for panic attacks and have a serious side effect. MAKE SURE YOU:  Understand these instructions.  Will watch your condition.  Will get help right away if you are not doing well or get worse. Document Released: 06/30/2005 Document Revised: 07/05/2013 Document Reviewed: 02/11/2013 [facility] Patient Information 2015 [employer] [place]. This information is not intended to replace advice given to you by your health care provider. Make sure you discuss any questions you have with your health care provider.

## 2014-07-12 ENCOUNTER — Emergency Department (HOSPITAL_COMMUNITY)
Admission: EM | Admit: 2014-07-12 | Discharge: 2014-07-12 | Disposition: A | Discharge status: Home / Self Care | Payer: No Typology Code available for payment source | Attending: Emergency Medicine | Admitting: Emergency Medicine

## 2014-07-12 ENCOUNTER — Encounter (HOSPITAL_COMMUNITY): Payer: Self-pay | Admitting: Emergency Medicine

## 2014-07-12 DIAGNOSIS — H9202 Otalgia, left ear: Secondary | ICD-10-CM | POA: Diagnosis present

## 2014-07-12 DIAGNOSIS — Z91040 Latex allergy status: Secondary | ICD-10-CM | POA: Insufficient documentation

## 2014-07-12 DIAGNOSIS — Z79899 Other long term (current) drug therapy: Secondary | ICD-10-CM | POA: Diagnosis not present

## 2014-07-12 DIAGNOSIS — J0110 Acute frontal sinusitis, unspecified: Secondary | ICD-10-CM | POA: Insufficient documentation

## 2014-07-12 DIAGNOSIS — J029 Acute pharyngitis, unspecified: Secondary | ICD-10-CM | POA: Diagnosis not present

## 2014-07-12 MED ORDER — PSEUDOEPHEDRINE HCL 60 MG PO TABS: 60.0000 mg | ORAL_TABLET | Freq: Four times a day (QID) | ORAL | Status: DC | PRN

## 2014-07-12 MED ORDER — SALINE SPRAY 0.65 % NA SOLN: 2.0000 | NASAL | Status: DC | PRN

## 2014-07-12 MED ORDER — AMOXICILLIN-POT CLAVULANATE 500-125 MG PO TABS: 1.0000 | ORAL_TABLET | Freq: Three times a day (TID) | ORAL | Status: DC

## 2014-07-12 NOTE — ED Provider Notes (Signed)
CSN: [id number]     Arrival date & time 07/12/14  0919 History   First MD Initiated Contact with Patient 07/12/14 1010     Chief Complaint  Patient presents with  . Otalgia     (Consider location/radiation/quality/duration/timing/severity/associated sxs/prior Treatment) Patient is a 28 y.o. female presenting with ear pain. The history is provided by the patient.  Otalgia Location:  Left Behind ear:  No abnormality Quality:  Aching and throbbing Severity:  Moderate Duration:  1 week Timing:  Intermittent Progression:  Waxing and waning Chronicity:  New Context: not direct blow, not elevation change, not foreign body in ear, not loud noise and no water in ear   Context comment:  Sinus congestion Relieved by:  OTC medications Worsened by:  Coughing and swallowing Ineffective treatments:  OTC medications Associated symptoms: congestion, cough, headaches and sore throat ( mild)   Associated symptoms: no abdominal pain, no diarrhea, no fever and no vomiting    Pt is a 28yo female with c/o sinus congestion and pressure with frontal headache since Thanksgiving. Pt reports 1 week mildly productive cough and left ear pain. She has tried Mucinex at home with minimal relief.  Denies sick contacts or recent travel. Denies fever, n/v/d. No hx of asthma. Hx of seasonal allergies for which she occasionally uses nasal spray but has not used since symptom onset.  Past Medical History  Diagnosis Date  . Type ab blood, rh negative    Past Surgical History  Procedure Laterality Date  . Gallbladder surgery  7-06  . Cholecystectomy     Family History  Problem Relation Age of Onset  . Suicidality Father     2011  . Diabetes Maternal Grandfather    History  Substance Use Topics  . Smoking status: Never Smoker   . Smokeless tobacco: Not on file  . Alcohol Use: Yes     Comment: SOCIALLY   OB History    Gravida Para Term Preterm AB TAB SAB Ectopic Multiple Living   2 2              Review of Systems  Constitutional: Negative for fever and chills.  HENT: Positive for congestion, ear pain and sore throat ( mild). Negative for trouble swallowing and voice change.   Respiratory: Positive for cough. Negative for shortness of breath.   Gastrointestinal: Negative for nausea, vomiting, abdominal pain and diarrhea.  Neurological: Positive for headaches.  All other systems reviewed and are negative.     Allergies  Latex  Home Medications   Prior to Admission medications   Medication Sig Start Date End Date Taking? Authorizing Provider  ALPRAZolam Prudy Feeler(XANAX) 0.5 MG tablet Take 1 tablet (0.5 mg total) by mouth every 8 (eight) hours as needed for anxiety. 05/17/14   Robyn M Hess, PA-C  amoxicillin-clavulanate (AUGMENTIN) 500-125 MG per tablet Take 1 tablet (500 mg total) by mouth 3 (three) times daily. 07/12/14   Junius FinnerErin O'Malley, PA-C  amphetamine-dextroamphetamine (ADDERALL XR) 20 MG 24 hr capsule Take 20 mg by mouth daily.    Historical Provider, MD  pseudoephedrine (SUDAFED) 60 MG tablet Take 1 tablet (60 mg total) by mouth every 6 (six) hours as needed for congestion. 07/12/14   Junius FinnerErin O'Malley, PA-C  sodium chloride (OCEAN) 0.65 % SOLN nasal spray Place 2 sprays into both nostrils as needed for congestion. 07/12/14   Junius FinnerErin O'Malley, PA-C   BP 120/68 mmHg  Pulse 85  Temp(Src) 98 F (36.7 C) (Oral)  Resp 15  SpO2 100% Physical  Exam  Constitutional: She appears well-developed and well-nourished. No distress.  HENT:  Head: Normocephalic and atraumatic.  Right Ear: Hearing, tympanic membrane, external ear and ear canal normal.  Left Ear: Hearing, tympanic membrane, external ear and ear canal normal.  Nose: Mucosal edema present. Right sinus exhibits maxillary sinus tenderness and frontal sinus tenderness. Left sinus exhibits maxillary sinus tenderness and frontal sinus tenderness.  Mouth/Throat: Uvula is midline and mucous membranes are normal. Posterior oropharyngeal  erythema present. No oropharyngeal exudate, posterior oropharyngeal edema or tonsillar abscesses.  Eyes: Conjunctivae are normal. No scleral icterus.  Neck: Normal range of motion. Neck supple.  Cardiovascular: Normal rate, regular rhythm and normal heart sounds.   Pulmonary/Chest: Effort normal and breath sounds normal. No respiratory distress. She has no wheezes. She has no rales. She exhibits no tenderness.  Abdominal: Soft. Bowel sounds are normal. She exhibits no distension and no mass. There is no tenderness. There is no rebound and no guarding.  Musculoskeletal: Normal range of motion.  Lymphadenopathy:    She has no cervical adenopathy.  Neurological: She is alert.  Skin: Skin is warm and dry. She is not diaphoretic.  Nursing note and vitals reviewed.   ED Course  Procedures (including critical care time) Labs Review Labs Reviewed - No data to display  Imaging Review No results found.   EKG Interpretation None      MDM   Final diagnoses:  Acute frontal sinusitis, recurrence not specified  Otalgia, left   Pt presenting to ED with c/o sinus congestion and frontal headache for 1 month. On exam, pt appears well, non-toxic, afebrile. Pt does have bilateral frontal and maxillary sinus tenderness with nasal mucosa edema and tonsillar erythema but no edema or exudate. Due to duration of symptoms as well as positive findings on exam, will tx for bacterial sinusitis with augmentin. Home care instructions provided. Advised to f/u with PCP next week for recheck of symptoms if not improving. Return precautions provided. Pt verbalized understanding and agreement with tx plan.   Junius FinnerErin O'Malley, PA-C 07/12/14 1127  Audree CamelScott T Goldston, MD 07/13/14 [phone]

## 2014-07-12 NOTE — ED Notes (Signed)
Pt states that she has been having a sinus infection since thanksgiving.  Pt states that her lt ear also started hurting her.  When asked if anything happened to make her come to the ER for this, pt states no.

## 2015-04-18 ENCOUNTER — Emergency Department (HOSPITAL_COMMUNITY)
Admission: EM | Admit: 2015-04-18 | Discharge: 2015-04-18 | Disposition: A | Discharge status: Home / Self Care | Payer: Self-pay | Attending: Emergency Medicine | Admitting: Emergency Medicine

## 2015-04-18 ENCOUNTER — Encounter (HOSPITAL_COMMUNITY): Payer: Self-pay | Admitting: Emergency Medicine

## 2015-04-18 DIAGNOSIS — Z79899 Other long term (current) drug therapy: Secondary | ICD-10-CM | POA: Insufficient documentation

## 2015-04-18 DIAGNOSIS — Z792 Long term (current) use of antibiotics: Secondary | ICD-10-CM | POA: Insufficient documentation

## 2015-04-18 DIAGNOSIS — Z91040 Latex allergy status: Secondary | ICD-10-CM | POA: Insufficient documentation

## 2015-04-18 DIAGNOSIS — J069 Acute upper respiratory infection, unspecified: Secondary | ICD-10-CM | POA: Insufficient documentation

## 2015-04-18 MED ORDER — PREDNISONE 10 MG PO TABS: 20.0000 mg | ORAL_TABLET | Freq: Every day | ORAL | Status: DC

## 2015-04-18 MED ORDER — AZITHROMYCIN 250 MG PO TABS: ORAL_TABLET | ORAL | Status: DC

## 2015-04-18 NOTE — Discharge Instructions (Signed)

## 2015-04-18 NOTE — ED Provider Notes (Signed)
CSN: [id number]     Arrival date & time 04/18/15  1139 History   None    Chief Complaint  Patient presents with  . Nasal Congestion     (Consider location/radiation/quality/duration/timing/severity/associated sxs/prior Treatment) Patient is a 29 y.o. female presenting with URI. The history is provided by the patient.  URI Presenting symptoms: congestion   Presenting symptoms: no fever   Severity:  Moderate Duration:  2 days Timing:  Intermittent Progression:  Worsening Chronicity:  New Relieved by:  Nothing Worsened by:  Nothing tried Associated symptoms: headaches and sneezing   Associated symptoms: no wheezing   Risk factors: recent illness   Risk factors: no diabetes mellitus and no recent travel     Past Medical History  Diagnosis Date  . Type ab blood, rh negative    Past Surgical History  Procedure Laterality Date  . Gallbladder surgery  7-06  . Cholecystectomy     Family History  Problem Relation Age of Onset  . Suicidality Father     2011  . Diabetes Maternal Grandfather    Social History  Substance Use Topics  . Smoking status: Never Smoker   . Smokeless tobacco: None  . Alcohol Use: Yes     Comment: SOCIALLY   OB History    Gravida Para Term Preterm AB TAB SAB Ectopic Multiple Living   2 2             Review of Systems  Constitutional: Negative for fever.  HENT: Positive for congestion and sneezing.   Respiratory: Negative for wheezing.   Neurological: Positive for headaches.  All other systems reviewed and are negative.     Allergies  Latex  Home Medications   Prior to Admission medications   Medication Sig Start Date End Date Taking? Authorizing Provider  ALPRAZolam Prudy Feeler) 0.5 MG tablet Take 1 tablet (0.5 mg total) by mouth every 8 (eight) hours as needed for anxiety. 05/17/14   Robyn M Hess, PA-C  amoxicillin-clavulanate (AUGMENTIN) 500-125 MG per tablet Take 1 tablet (500 mg total) by mouth 3 (three) times daily. 07/12/14   Junius Finner, PA-C  amphetamine-dextroamphetamine (ADDERALL XR) 20 MG 24 hr capsule Take 20 mg by mouth daily.    Historical Provider, MD  pseudoephedrine (SUDAFED) 60 MG tablet Take 1 tablet (60 mg total) by mouth every 6 (six) hours as needed for congestion. 07/12/14   Junius Finner, PA-C  sodium chloride (OCEAN) 0.65 % SOLN nasal spray Place 2 sprays into both nostrils as needed for congestion. 07/12/14   Junius Finner, PA-C   BP 113/72 mmHg  Pulse 80  Temp(Src) 98.3 F (36.8 C) (Oral)  Resp 16  Ht  (1.6 m)  Wt 190 lb (86.183 kg)  BMI 33.67 kg/m2  SpO2 99%  LMP 04/04/2015 Physical Exam  Constitutional: She is oriented to person, place, and time. She appears well-developed and well-nourished.  Non-toxic appearance.  HENT:  Head: Normocephalic.  Right Ear: Tympanic membrane and external ear normal.  Left Ear: Tympanic membrane and external ear normal.  Nasal congestion present.  The airway is patent.  Eyes: EOM and lids are normal. Pupils are equal, round, and reactive to light.  Neck: Normal range of motion. Neck supple. Carotid bruit is not present.  Cardiovascular: Normal rate, regular rhythm, normal heart sounds, intact distal pulses and normal pulses.   Pulmonary/Chest: Breath sounds normal. No respiratory distress. She has no wheezes. She has no rales.  Abdominal: Soft. Bowel sounds are normal. There is no  tenderness. There is no guarding.  Musculoskeletal: Normal range of motion.  Lymphadenopathy:       Head (right side): No submandibular adenopathy present.       Head (left side): No submandibular adenopathy present.    She has no cervical adenopathy.  Neurological: She is alert and oriented to person, place, and time. She has normal strength. No cranial nerve deficit or sensory deficit.  Skin: Skin is warm and dry.  Psychiatric: She has a normal mood and affect. Her speech is normal.  Nursing note and vitals reviewed.   ED Course  Procedures (including critical  care time) Labs Review Labs Reviewed - No data to display  Imaging Review No results found. I have personally reviewed and evaluated these images and lab results as part of my medical decision-making.   EKG Interpretation None      MDM  Vital signs are well within normal limits. The examination favors an upper respiratory infection. The patient states that she is easily prone to infections, and she would like to be treated before it gets "worse". She states she has had great success with Z-Pak. Prescription for Z-Pak and short course of steroidal be given to the patient. I've asked her to increase fluids, and use the decongesting medication of her choice. Patient is in agreement with this discharge plan.    Final diagnoses:  None    *I have reviewed nursing notes, vital signs, and all appropriate lab and imaging results for this patient.[address] PA-C 04/18/15 1423  Bethann Berkshire, MD 04/19/15 [phone]

## 2015-04-18 NOTE — ED Notes (Signed)
Pt states that she has had a lot of nasal congestion the past few days.  States that she has developed a cough in the past 48 hours.

## 2015-10-24 ENCOUNTER — Encounter (HOSPITAL_COMMUNITY): Payer: Self-pay | Admitting: Family Medicine

## 2015-10-24 ENCOUNTER — Emergency Department (HOSPITAL_COMMUNITY)
Admission: EM | Admit: 2015-10-24 | Discharge: 2015-10-24 | Disposition: A | Discharge status: Home / Self Care | Payer: Self-pay | Attending: Emergency Medicine | Admitting: Emergency Medicine

## 2015-10-24 ENCOUNTER — Emergency Department (HOSPITAL_BASED_OUTPATIENT_CLINIC_OR_DEPARTMENT_OTHER)
Admit: 2015-10-24 | Discharge: 2015-10-24 | Disposition: A | Discharge status: Home / Self Care | Payer: Self-pay | Attending: Emergency Medicine | Admitting: Emergency Medicine

## 2015-10-24 DIAGNOSIS — M7989 Other specified soft tissue disorders: Secondary | ICD-10-CM | POA: Insufficient documentation

## 2015-10-24 NOTE — ED Notes (Signed)
PT no answer when called for re-assessment in waiting room

## 2015-10-24 NOTE — ED Notes (Signed)
Pt here for BLE swelling that started yesterday. sts her RLE feels like its very tight. Denies chest pain, SOB.

## 2015-10-24 NOTE — Progress Notes (Signed)
*  Preliminary Results* Right lower extremity venous duplex completed. Right lower extremity is negative for deep vein thrombosis. There is no evidence of right Baker's cyst.  10/24/2015 6:49 PM  Gertie FeyMichelle Dervin Vore, RVT, RDCS, RDMS

## 2015-12-12 ENCOUNTER — Encounter (HOSPITAL_COMMUNITY): Payer: Self-pay

## 2015-12-12 ENCOUNTER — Emergency Department (HOSPITAL_COMMUNITY): Payer: Medicaid Other

## 2015-12-12 ENCOUNTER — Emergency Department (HOSPITAL_COMMUNITY)
Admission: EM | Admit: 2015-12-12 | Discharge: 2015-12-12 | Disposition: A | Discharge status: Home / Self Care | Payer: Medicaid Other | Attending: Emergency Medicine | Admitting: Emergency Medicine

## 2015-12-12 DIAGNOSIS — R1084 Generalized abdominal pain: Secondary | ICD-10-CM | POA: Diagnosis not present

## 2015-12-12 DIAGNOSIS — O9989 Other specified diseases and conditions complicating pregnancy, childbirth and the puerperium: Secondary | ICD-10-CM | POA: Insufficient documentation

## 2015-12-12 DIAGNOSIS — R197 Diarrhea, unspecified: Secondary | ICD-10-CM | POA: Insufficient documentation

## 2015-12-12 DIAGNOSIS — M7989 Other specified soft tissue disorders: Secondary | ICD-10-CM | POA: Diagnosis not present

## 2015-12-12 DIAGNOSIS — Z91040 Latex allergy status: Secondary | ICD-10-CM | POA: Diagnosis not present

## 2015-12-12 DIAGNOSIS — Z3A00 Weeks of gestation of pregnancy not specified: Secondary | ICD-10-CM | POA: Insufficient documentation

## 2015-12-12 DIAGNOSIS — M25472 Effusion, left ankle: Secondary | ICD-10-CM | POA: Insufficient documentation

## 2015-12-12 DIAGNOSIS — M25471 Effusion, right ankle: Secondary | ICD-10-CM | POA: Insufficient documentation

## 2015-12-12 DIAGNOSIS — Z7952 Long term (current) use of systemic steroids: Secondary | ICD-10-CM | POA: Insufficient documentation

## 2015-12-12 DIAGNOSIS — Z3490 Encounter for supervision of normal pregnancy, unspecified, unspecified trimester: Secondary | ICD-10-CM

## 2015-12-12 LAB — I-STAT BETA HCG BLOOD, ED (MC, WL, AP ONLY): I-stat hCG, quantitative: 845.9 m[IU]/mL — ABNORMAL HIGH (ref <5)

## 2015-12-12 LAB — URINE MICROSCOPIC-ADD ON: RBC / HPF: NONE SEEN RBC/hpf (ref 0–5)

## 2015-12-12 LAB — URINALYSIS, ROUTINE W REFLEX MICROSCOPIC
BILIRUBIN URINE: NEGATIVE
GLUCOSE, UA: NEGATIVE mg/dL
HGB URINE DIPSTICK: NEGATIVE
Ketones, ur: 15 mg/dL — AB
Nitrite: NEGATIVE
Protein, ur: NEGATIVE mg/dL
SPECIFIC GRAVITY, URINE: 1.017 (ref 1.005–1.030)
pH: 5.5 (ref 5.0–8.0)

## 2015-12-12 LAB — HCG, QUANTITATIVE, PREGNANCY: HCG, BETA CHAIN, QUANT, S: 1001 m[IU]/mL — AB (ref <5)

## 2015-12-12 NOTE — ED Notes (Signed)
Patient here with complaining of left lower abdominal pain. Had home pregnancy test and thinks she may have ectopic, hx of same

## 2015-12-12 NOTE — Discharge Instructions (Signed)
Return here as needed. Follow up with your GYN as soon as possible

## 2015-12-12 NOTE — ED Provider Notes (Signed)
CSN: [id number]     Arrival date & time 12/12/15  1631 History  By signing my name below, I, Iona BeardChristian Pulliam, attest that this documentation has been prepared under the direction and in the presence of BoeingChris Romonda Parker, PA-C.   Electronically Signed: Iona Beardhristian Pulliam, ED Scribe 12/12/2015 at 5:50 PM.   No chief complaint on file.   The history is provided by the patient. No language interpreter was used.   HPI Comments: Vicki MalletMica M Bohnsack is a 30 y.o. female with PMHx of ectopic pregnancy who presents to the Emergency Department complaining of gradual onset, LLQ abdominal pain, ongoing for one week, worsening in the past two days. Pt reports associated diarrhea and mild swelling to her bilateral feet and ankles. Her swelling alleviated PTA. Pt reports she is currently pregnant and believes she may have an ectopic pregnancy. Her symptoms present similarly to her previous ectopic pregnancy. No other associated symptoms noted. No worsening or alleviating factors noted. Pt denies vaginal bleeding, vaginal discharge, or any other pertinent symptoms.   Past Medical History  Diagnosis Date  . Type ab blood, rh negative    Past Surgical History  Procedure Laterality Date  . Gallbladder surgery  7-06  . Cholecystectomy     Family History  Problem Relation Age of Onset  . Suicidality Father     2011  . Diabetes Maternal Grandfather    Social History  Substance Use Topics  . Smoking status: Never Smoker   . Smokeless tobacco: None  . Alcohol Use: Yes     Comment: SOCIALLY   OB History    Gravida Para Term Preterm AB TAB SAB Ectopic Multiple Living   2 2             Review of Systems A complete 10 system review of systems was obtained and all systems are negative except as noted in the HPI and PMH.    Allergies  Latex  Home Medications   Prior to Admission medications   Medication Sig Start Date End Date Taking? Authorizing Provider  azithromycin (ZITHROMAX Z-PAK) 250 MG tablet Used 2  tablets day 1, then 1 tablet daily until all taken. 04/18/15   Ivery QualeHobson Bryant, PA-C  predniSONE (DELTASONE) 10 MG tablet Take 2 tablets (20 mg total) by mouth daily. 04/18/15   Ivery QualeHobson Bryant, PA-C  pseudoephedrine (SUDAFED) 60 MG tablet Take 1 tablet (60 mg total) by mouth every 6 (six) hours as needed for congestion. 07/12/14   Junius FinnerErin O'Malley, PA-C  pseudoephedrine-guaifenesin (MUCINEX D) 60-600 MG 12 hr tablet Take 2 tablets by mouth every 12 (twelve) hours as needed for congestion.    Historical Provider, MD   BP 121/88 mmHg  Pulse 72  Temp(Src) 98.3 F (36.8 C) (Oral)  Resp 18  Ht 5\' 3"  (1.6 m)  Wt 220 lb (99.791 kg)  BMI 38.98 kg/m2  SpO2 100%  LMP 09/26/2015 Physical Exam  Constitutional: She is oriented to person, place, and time. She appears well-developed and well-nourished. No distress.  HENT:  Head: Normocephalic and atraumatic.  Eyes: EOM are normal.  Neck: Normal range of motion.  Cardiovascular: Normal rate, regular rhythm and normal heart sounds.   Pulmonary/Chest: Effort normal and breath sounds normal.  Abdominal: Soft. There is tenderness. There is no rebound and no guarding.  Diffuse tenderness throughout lower abdomen and pelvic area.  Musculoskeletal: Normal range of motion.  Neurological: She is alert and oriented to person, place, and time.  Skin: Skin is warm and dry.  Psychiatric:  She has a normal mood and affect. Judgment normal.  Nursing note and vitals reviewed.   ED Course  Procedures (including critical care time) DIAGNOSTIC STUDIES: Oxygen Saturation is 100% on RA, normal by my interpretation.    COORDINATION OF CARE: 5:50 PM Discussed treatment plan with pt at bedside and pt agreed to plan.   Labs Review Labs Reviewed  URINALYSIS, ROUTINE W REFLEX MICROSCOPIC (NOT AT [facility]) - Abnormal; Notable for the following:    APPearance HAZY (*)    Ketones, ur 15 (*)    Leukocytes, UA SMALL (*)    All other components within normal limits  HCG,  QUANTITATIVE, PREGNANCY - Abnormal; Notable for the following:    hCG, Beta Chain, Quant, S 1001 (*)    All other components within normal limits  URINE MICROSCOPIC-ADD ON - Abnormal; Notable for the following:    Squamous Epithelial / LPF 6-30 (*)    Bacteria, UA RARE (*)    All other components within normal limits  I-STAT BETA HCG BLOOD, ED ([facility], [facility], [facility] ONLY) - Abnormal; Notable for the following:    I-stat hCG, quantitative 845.9 (*)    All other components within normal limits  CBC    Imaging Review US Ob Comp Less 14 Wks  12/12/2015  CLINICAL DATA:  Acute onset of left lower quadrant abdominal pain. Initial encounter. EXAM: OBSTETRIC <14 WK [place] AND TRANSVAGINAL OB US TECHNIQUE: Both transabdominal and transvaginal ultrasound examinations were performed for complete evaluation of the gestation as well as the maternal uterus, adnexal regions, and pelvic cul-de-sac. Transvaginal technique was performed to assess early pregnancy. COMPARISON:  Pelvic ultrasound performed 04/12/2008 FINDINGS: Intrauterine gestational sac: None seen. Yolk sac:  N/A Embryo:  N/A Subchorionic hemorrhage:  None visualized. Maternal uterus/adnexae: The endometrial echo complex is diffusely thickened and mildly heterogeneous. Nabothian cysts are seen at the cervix. The ovaries are unremarkable in appearance. The right ovary measures 6.2 x 3.8 x 3.7 cm, while the left ovary measures 3.0 x 1.4 x 1.8 cm. No suspicious adnexal masses are seen; there is no evidence for ovarian torsion. The asymmetrically enlarged right ovary is thought to reflect a corpus luteal cyst, though the appearance of this cyst is somewhat unusual. No definite ectopic pregnancy is seen. IMPRESSION: No intrauterine gestational sac seen. No evidence for ectopic pregnancy at this time. This remains within normal limits, given the quantitative beta HCG of 1,001. If the quantitative beta HCG level continues to rise, follow-up pelvic ultrasound could be  performed in 1-2 weeks. Electronically Signed   By: Roanna Raider M.D.   On: 12/12/2015 20:07   US Ob Transvaginal  12/12/2015  CLINICAL DATA:  Acute onset of left lower quadrant abdominal pain. Initial encounter. EXAM: OBSTETRIC <14 WK [place] AND TRANSVAGINAL OB US TECHNIQUE: Both transabdominal and transvaginal ultrasound examinations were performed for complete evaluation of the gestation as well as the maternal uterus, adnexal regions, and pelvic cul-de-sac. Transvaginal technique was performed to assess early pregnancy. COMPARISON:  Pelvic ultrasound performed 04/12/2008 FINDINGS: Intrauterine gestational sac: None seen. Yolk sac:  N/A Embryo:  N/A Subchorionic hemorrhage:  None visualized. Maternal uterus/adnexae: The endometrial echo complex is diffusely thickened and mildly heterogeneous. Nabothian cysts are seen at the cervix. The ovaries are unremarkable in appearance. The right ovary measures 6.2 x 3.8 x 3.7 cm, while the left ovary measures 3.0 x 1.4 x 1.8 cm. No suspicious adnexal masses are seen; there is no evidence for ovarian torsion. The asymmetrically enlarged right ovary is  thought to reflect a corpus luteal cyst, though the appearance of this cyst is somewhat unusual. No definite ectopic pregnancy is seen. IMPRESSION: No intrauterine gestational sac seen. No evidence for ectopic pregnancy at this time. This remains within normal limits, given the quantitative beta HCG of 1,001. If the quantitative beta HCG level continues to rise, follow-up pelvic ultrasound could be performed in 1-2 weeks. Electronically Signed   By: Roanna Raider M.D.   On: 12/12/2015 20:07   I have personally reviewed and evaluated these images and lab results as part of my medical decision-making.  I personally performed the services described in this documentation, which was scribed in my presence. The recorded information has been reviewed and is accurate.   Patient referred back to her GYN doctor for follow-up  evaluation appointment.  Patient agrees to the plan and all questions were answered    Charlestine Night, PA-C 12/16/15 0631  Tilden Fossa, MD 12/20/15 [phone]

## 2015-12-12 NOTE — ED Notes (Signed)
Pt alert, NAD, calm, interactive, resps e/u, to US by w/c.

## 2015-12-12 NOTE — ED Notes (Signed)
Back from US, EDPA into see pt. No changes.

## 2015-12-20 ENCOUNTER — Other Ambulatory Visit: Payer: Self-pay

## 2015-12-20 DIAGNOSIS — Z3490 Encounter for supervision of normal pregnancy, unspecified, unspecified trimester: Secondary | ICD-10-CM

## 2015-12-21 ENCOUNTER — Telehealth: Payer: Self-pay | Admitting: *Deleted

## 2015-12-21 DIAGNOSIS — Z3490 Encounter for supervision of normal pregnancy, unspecified, unspecified trimester: Secondary | ICD-10-CM

## 2015-12-21 LAB — HCG, QUANTITATIVE, PREGNANCY: HCG, BETA CHAIN, QUANT, S: 13554.7 m[IU]/mL — AB

## 2015-12-21 NOTE — Telephone Encounter (Signed)
Pt called requesting her test results.

## 2015-12-21 NOTE — Telephone Encounter (Signed)
Called patient and informed her of results. Dr Debroah LoopArnold recommends follow up ultrasound. Scheduled for 6/16 with clinic appt to follow. Also informed patient. Patient verbalized understanding to all & had no questions

## 2015-12-27 ENCOUNTER — Ambulatory Visit (INDEPENDENT_AMBULATORY_CARE_PROVIDER_SITE_OTHER): Payer: Medicaid Other | Admitting: Obstetrics & Gynecology

## 2015-12-27 ENCOUNTER — Other Ambulatory Visit: Payer: Self-pay | Admitting: Obstetrics & Gynecology

## 2015-12-27 ENCOUNTER — Encounter (HOSPITAL_COMMUNITY): Payer: Self-pay

## 2015-12-27 ENCOUNTER — Ambulatory Visit (HOSPITAL_COMMUNITY)
Admission: RE | Admit: 2015-12-27 | Discharge: 2015-12-27 | Disposition: A | Discharge status: Home / Self Care | Payer: Medicaid Other | Source: Ambulatory Visit | Attending: Obstetrics & Gynecology | Admitting: Obstetrics & Gynecology

## 2015-12-27 DIAGNOSIS — Z36 Encounter for antenatal screening of mother: Secondary | ICD-10-CM | POA: Insufficient documentation

## 2015-12-27 DIAGNOSIS — Z3490 Encounter for supervision of normal pregnancy, unspecified, unspecified trimester: Secondary | ICD-10-CM

## 2015-12-27 DIAGNOSIS — Z3A01 Less than 8 weeks gestation of pregnancy: Secondary | ICD-10-CM | POA: Insufficient documentation

## 2015-12-27 DIAGNOSIS — O26891 Other specified pregnancy related conditions, first trimester: Secondary | ICD-10-CM

## 2015-12-27 DIAGNOSIS — N949 Unspecified condition associated with female genital organs and menstrual cycle: Secondary | ICD-10-CM

## 2015-12-27 DIAGNOSIS — O3680X0 Pregnancy with inconclusive fetal viability, not applicable or unspecified: Secondary | ICD-10-CM

## 2015-12-27 DIAGNOSIS — O0911 Supervision of pregnancy with history of ectopic or molar pregnancy, first trimester: Secondary | ICD-10-CM

## 2015-12-27 DIAGNOSIS — R102 Pelvic and perineal pain: Principal | ICD-10-CM

## 2015-12-27 NOTE — Progress Notes (Signed)
Patient ID: Paula MalletMica M Sullivan, female   DOB: September 25, 1985, 30 y.o.   MRN: [id number] History:  30 y.o. G4P0020 here today for review of sono. Pt was seen in the ED and then had  A f/u bHCG and sono.  Here for results. Pt reports that she is a pt of Dr. Ashok NorrisMeisenger.     The following portions of the patient's history were reviewed and updated as appropriate: allergies, current medications, past family history, past medical history, past social history, past surgical history and problem list.  Review of Systems:  Pertinent items are noted in HPI.  Objective:  Physical Exam Last menstrual period 11/04/2015.  Exam deferred   Labs and Imaging CLINICAL DATA: Assess dating and viability. Uncertain LMP. No intrauterine gestational sac on previous ultrasound.  LMP was04/23/2017.  Gestational age by LMP is7 weeks 4 days.  EDC by LMP is01/28/2018.  EXAM: TRANSVAGINAL OB ULTRASOUND  TECHNIQUE: Transvaginal ultrasound was performed for complete evaluation of the gestation as well as the maternal uterus, adnexal regions, and pelvic cul-de-sac.  COMPARISON: 12/12/2015  FINDINGS: Intrauterine gestational sac: Present  Yolk sac: Present  Embryo: Present  Cardiac Activity: Present  Heart Rate: 124 bpm  CRL: 5.1 mm 6 w 1 d US EDC: 08/20/2016  Subchorionic hemorrhage: Small subchorionic hemorrhage is present.  Maternal uterus/adnexae: Probable right corpus luteum cyst is present. The left ovary has a normal appearance. Trace free pelvic fluid identified.  IMPRESSION: 1. Living intrauterine embryo measuring 6 weeks 1 day. 2. Ultrasound dating differs compared with clinical dating. By today's exam EDC is 08/20/2016.  Assessment & Plan:  6 week IUP Pt has another OB f/u.  Will not have OB care here.  F/u prn PNV 1 po q day  Robbie Nangle L. Harraway-Smith, M.D., Evern CoreFACOG

## 2015-12-27 NOTE — Patient Instructions (Signed)
First Trimester of Pregnancy The first trimester of pregnancy is from week 1 until the end of week 12 (months 1 through 3). A week after a sperm fertilizes an egg, the egg will implant on the wall of the uterus. This embryo will begin to develop into a baby. Genes from you and your partner are forming the baby. The female genes determine whether the baby is a boy or a girl. At 6-8 weeks, the eyes and face are formed, and the heartbeat can be seen on ultrasound. At the end of 12 weeks, all the baby's organs are formed.  Now that you are pregnant, you will want to do everything you can to have a healthy baby. Two of the most important things are to get good prenatal care and to follow your health care provider's instructions. Prenatal care is all the medical care you receive before the baby's birth. This care will help prevent, find, and treat any problems during the pregnancy and childbirth. BODY CHANGES Your body goes through many changes during pregnancy. The changes vary from woman to woman.   You may gain or lose a couple of pounds at first.  You may feel sick to your stomach (nauseous) and throw up (vomit). If the vomiting is uncontrollable, call your health care provider.  You may tire easily.  You may develop headaches that can be relieved by medicines approved by your health care provider.  You may urinate more often. Painful urination may mean you have a bladder infection.  You may develop heartburn as a result of your pregnancy.  You may develop constipation because certain hormones are causing the muscles that push waste through your intestines to slow down.  You may develop hemorrhoids or swollen, bulging veins (varicose veins).  Your breasts may begin to grow larger and become tender. Your nipples may stick out more, and the tissue that surrounds them (areola) may become darker.  Your gums may bleed and may be sensitive to brushing and flossing.  Dark spots or blotches (chloasma,  mask of pregnancy) may develop on your face. This will likely fade after the baby is born.  Your menstrual periods will stop.  You may have a loss of appetite.  You may develop cravings for certain kinds of food.  You may have changes in your emotions from day to day, such as being excited to be pregnant or being concerned that something may go wrong with the pregnancy and baby.  You may have more vivid and strange dreams.  You may have changes in your hair. These can include thickening of your hair, rapid growth, and changes in texture. Some women also have hair loss during or after pregnancy, or hair that feels dry or thin. Your hair will most likely return to normal after your baby is born. WHAT TO EXPECT AT YOUR PRENATAL VISITS During a routine prenatal visit:  You will be weighed to make sure you and the baby are growing normally.  Your blood pressure will be taken.  Your abdomen will be measured to track your baby's growth.  The fetal heartbeat will be listened to starting around week 10 or 12 of your pregnancy.  Test results from any previous visits will be discussed. Your health care provider may ask you:  How you are feeling.  If you are feeling the baby move.  If you have had any abnormal symptoms, such as leaking fluid, bleeding, severe headaches, or abdominal cramping.  If you are using any tobacco products,   including cigarettes, chewing tobacco, and electronic cigarettes.  If you have any questions. Other tests that may be performed during your first trimester include:  Blood tests to find your blood type and to check for the presence of any previous infections. They will also be used to check for low iron levels (anemia) and Rh antibodies. Later in the pregnancy, blood tests for diabetes will be done along with other tests if problems develop.  Urine tests to check for infections, diabetes, or protein in the urine.  An ultrasound to confirm the proper growth  and development of the baby.  An amniocentesis to check for possible genetic problems.  Fetal screens for spina bifida and Down syndrome.  You may need other tests to make sure you and the baby are doing well.  HIV (human immunodeficiency virus) testing. Routine prenatal testing includes screening for HIV, unless you choose not to have this test. HOME CARE INSTRUCTIONS  Medicines  Follow your health care provider's instructions regarding medicine use. Specific medicines may be either safe or unsafe to take during pregnancy.  Take your prenatal vitamins as directed.  If you develop constipation, try taking a stool softener if your health care provider approves. Diet  Eat regular, well-balanced meals. Choose a variety of foods, such as meat or vegetable-based protein, fish, milk and low-fat dairy products, vegetables, fruits, and whole grain breads and cereals. Your health care provider will help you determine the amount of weight gain that is right for you.  Avoid raw meat and uncooked cheese. These carry germs that can cause birth defects in the baby.  Eating four or five small meals rather than three large meals a day may help relieve nausea and vomiting. If you start to feel nauseous, eating a few soda crackers can be helpful. Drinking liquids between meals instead of during meals also seems to help nausea and vomiting.  If you develop constipation, eat more high-fiber foods, such as fresh vegetables or fruit and whole grains. Drink enough fluids to keep your urine clear or pale yellow. Activity and Exercise  Exercise only as directed by your health care provider. Exercising will help you:  Control your weight.  Stay in shape.  Be prepared for labor and delivery.  Experiencing pain or cramping in the lower abdomen or low back is a good sign that you should stop exercising. Check with your health care provider before continuing normal exercises.  Try to avoid standing for long  periods of time. Move your legs often if you must stand in one place for a long time.  Avoid heavy lifting.  Wear low-heeled shoes, and practice good posture.  You may continue to have sex unless your health care provider directs you otherwise. Relief of Pain or Discomfort  Wear a good support bra for breast tenderness.   Take warm sitz baths to soothe any pain or discomfort caused by hemorrhoids. Use hemorrhoid cream if your health care provider approves.   Rest with your legs elevated if you have leg cramps or low back pain.  If you develop varicose veins in your legs, wear support hose. Elevate your feet for 15 minutes, 3-4 times a day. Limit salt in your diet. Prenatal Care  Schedule your prenatal visits by the twelfth week of pregnancy. They are usually scheduled monthly at first, then more often in the last 2 months before delivery.  Write down your questions. Take them to your prenatal visits.  Keep all your prenatal visits as directed by your   health care provider. Safety  Wear your seat belt at all times when driving.  Make a list of emergency phone numbers, including numbers for family, friends, the hospital, and police and fire departments. General Tips  Ask your health care provider for a referral to a local prenatal education class. Begin classes no later than at the beginning of month 6 of your pregnancy.  Ask for help if you have counseling or nutritional needs during pregnancy. Your health care provider can offer advice or refer you to specialists for help with various needs.  Do not use hot tubs, steam rooms, or saunas.  Do not douche or use tampons or scented sanitary pads.  Do not cross your legs for long periods of time.  Avoid cat litter boxes and soil used by cats. These carry germs that can cause birth defects in the baby and possibly loss of the fetus by miscarriage or stillbirth.  Avoid all smoking, herbs, alcohol, and medicines not prescribed by  your health care provider. Chemicals in these affect the formation and growth of the baby.  Do not use any tobacco products, including cigarettes, chewing tobacco, and electronic cigarettes. If you need help quitting, ask your health care provider. You may receive counseling support and other resources to help you quit.  Schedule a dentist appointment. At home, brush your teeth with a soft toothbrush and be gentle when you floss. SEEK MEDICAL CARE IF:   You have dizziness.  You have mild pelvic cramps, pelvic pressure, or nagging pain in the abdominal area.  You have persistent nausea, vomiting, or diarrhea.  You have a bad smelling vaginal discharge.  You have pain with urination.  You notice increased swelling in your face, hands, legs, or ankles. SEEK IMMEDIATE MEDICAL CARE IF:   You have a fever.  You are leaking fluid from your vagina.  You have spotting or bleeding from your vagina.  You have severe abdominal cramping or pain.  You have rapid weight gain or loss.  You vomit blood or material that looks like coffee grounds.  You are exposed to German measles and have never had them.  You are exposed to fifth disease or chickenpox.  You develop a severe headache.  You have shortness of breath.  You have any kind of trauma, such as from a fall or a car accident.   This information is not intended to replace advice given to you by your health care provider. Make sure you discuss any questions you have with your health care provider.   Document Released: 06/24/2001 Document Revised: 07/21/2014 Document Reviewed: 05/10/2013 Elsevier Interactive Patient Education 2016 [employer]  

## 2015-12-28 ENCOUNTER — Ambulatory Visit (HOSPITAL_COMMUNITY): Payer: MEDICAID

## 2016-01-09 ENCOUNTER — Inpatient Hospital Stay (HOSPITAL_COMMUNITY)
Admission: AD | Admit: 2016-01-09 | Discharge: 2016-01-09 | Disposition: A | Discharge status: Home / Self Care | Payer: Medicaid Other | Source: Ambulatory Visit | Attending: Obstetrics and Gynecology | Admitting: Obstetrics and Gynecology

## 2016-01-09 ENCOUNTER — Encounter (HOSPITAL_COMMUNITY): Payer: Self-pay | Admitting: *Deleted

## 2016-01-09 DIAGNOSIS — O9A211 Injury, poisoning and certain other consequences of external causes complicating pregnancy, first trimester: Secondary | ICD-10-CM | POA: Diagnosis not present

## 2016-01-09 DIAGNOSIS — Z041 Encounter for examination and observation following transport accident: Secondary | ICD-10-CM | POA: Insufficient documentation

## 2016-01-09 DIAGNOSIS — T1490 Injury, unspecified: Secondary | ICD-10-CM

## 2016-01-09 DIAGNOSIS — Z3A09 9 weeks gestation of pregnancy: Secondary | ICD-10-CM

## 2016-01-09 LAB — URINALYSIS, ROUTINE W REFLEX MICROSCOPIC
Bilirubin Urine: NEGATIVE
GLUCOSE, UA: NEGATIVE mg/dL
HGB URINE DIPSTICK: NEGATIVE
KETONES UR: 15 mg/dL — AB
LEUKOCYTES UA: NEGATIVE
Nitrite: NEGATIVE
PROTEIN: NEGATIVE mg/dL
Specific Gravity, Urine: >1.03 — ABNORMAL HIGH (ref 1.005–1.030)
pH: 5 (ref 5.0–8.0)
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
## 2016-01-09 NOTE — Discharge Instructions (Signed)

## 2016-01-15 LAB — OB RESULTS CONSOLE GC/CHLAMYDIA
CHLAMYDIA, DNA PROBE: NEGATIVE
GC PROBE AMP, GENITAL: NEGATIVE

## 2016-03-09 ENCOUNTER — Emergency Department
Admission: EM | Admit: 2016-03-09 | Discharge: 2016-03-09 | Disposition: A | Discharge status: Home / Self Care | Payer: Medicaid Other | Attending: Emergency Medicine | Admitting: Emergency Medicine

## 2016-03-09 ENCOUNTER — Encounter: Payer: Self-pay | Admitting: Emergency Medicine

## 2016-03-09 DIAGNOSIS — Z3A19 19 weeks gestation of pregnancy: Secondary | ICD-10-CM | POA: Diagnosis not present

## 2016-03-09 DIAGNOSIS — O99512 Diseases of the respiratory system complicating pregnancy, second trimester: Secondary | ICD-10-CM | POA: Insufficient documentation

## 2016-03-09 DIAGNOSIS — J069 Acute upper respiratory infection, unspecified: Secondary | ICD-10-CM | POA: Insufficient documentation

## 2016-03-09 DIAGNOSIS — J0100 Acute maxillary sinusitis, unspecified: Secondary | ICD-10-CM | POA: Diagnosis not present

## 2016-03-09 MED ORDER — AMOXICILLIN 500 MG PO CAPS
500.0000 mg | ORAL_CAPSULE | Freq: Once | ORAL | Status: AC
  Administered 2016-03-09: 500 mg via ORAL

## 2016-03-09 MED ORDER — FLUTICASONE PROPIONATE 50 MCG/ACT NA SUSP: 2.0000 | Freq: Every day | NASAL | 0 refills | Status: DC

## 2016-03-09 MED ORDER — AMOXICILLIN 500 MG PO CAPS
ORAL_CAPSULE | ORAL | Status: DC
Start: 2016-03-09 — End: 2016-03-10
  Filled 2016-03-09: qty 1

## 2016-03-09 MED ORDER — AMOXICILLIN 500 MG PO CAPS: 500.0000 mg | ORAL_CAPSULE | Freq: Three times a day (TID) | ORAL | 0 refills | Status: DC

## 2016-03-09 NOTE — ED Triage Notes (Signed)
Pt ambulatory to triage with no difficulty. Pt reports started about 6 days ago with congestion, cough, ear pain and low grade fever. Pt reports productive cough with yellow mucous and bilateral ear pain. Pt reports she is approx [redacted] weeks pregnant and has only been using tylenol and benadryl for that reason. Pt talking in full and complete sentences with no difficulty at this time.

## 2016-03-09 NOTE — Discharge Instructions (Signed)
Use the prescription meds as directed. Follow-up with your provider for continued symptoms.

## 2016-03-11 ENCOUNTER — Emergency Department
Admission: EM | Admit: 2016-03-11 | Discharge: 2016-03-11 | Disposition: A | Discharge status: Home / Self Care | Payer: Medicaid Other | Attending: Emergency Medicine | Admitting: Emergency Medicine

## 2016-03-11 ENCOUNTER — Encounter: Payer: Self-pay | Admitting: Emergency Medicine

## 2016-03-11 DIAGNOSIS — J069 Acute upper respiratory infection, unspecified: Secondary | ICD-10-CM | POA: Insufficient documentation

## 2016-03-11 DIAGNOSIS — O99512 Diseases of the respiratory system complicating pregnancy, second trimester: Secondary | ICD-10-CM | POA: Diagnosis not present

## 2016-03-11 DIAGNOSIS — Z3A19 19 weeks gestation of pregnancy: Secondary | ICD-10-CM | POA: Diagnosis not present

## 2016-03-11 MED ORDER — CROMOLYN SODIUM 4 % OP SOLN: 1.0000 [drp] | Freq: Four times a day (QID) | OPHTHALMIC | 12 refills | Status: DC

## 2016-03-11 NOTE — ED Provider Notes (Signed)
[facility] Emergency Department Provider Note    ____________________________________________   I have reviewed the triage vital signs and the nursing notes.   HISTORY  Chief Complaint URI and Decreased Fetal Movement   History limited by: Not Limited   HPI Paula Sullivan is a 30 y.o. female at roughly [redacted] weeks pregnant, who presents to the emergency department today because of concerns for continued sinus pressure and pain. The patient states she's been having these symptoms for most one week. She was seen in the emergency department 2 days ago and diagnosed with an upper respiratory infection. She has been taking Benadryl was recently started amoxicillin. She feels like these have not helped her symptoms. Her main complaint today is congested and runny nose as well as left eye pain and redness. She also describes some discharge from the left eye. Patient has not had any fevers.    Past Medical History:  Diagnosis Date  . Type ab blood, rh negative     There are no active problems to display for this patient.   Past Surgical History:  Procedure Laterality Date  . CHOLECYSTECTOMY    . GALLBLADDER SURGERY  7-06    Prior to Admission medications   Medication Sig Start Date End Date Taking? Authorizing Provider  acetaminophen (TYLENOL) 500 MG tablet Take 1,000 mg by mouth every 6 (six) hours as needed for mild pain or headache.    Historical Provider, MD  amoxicillin (AMOXIL) 500 MG capsule Take 1 capsule (500 mg total) by mouth 3 (three) times daily. 03/09/16   Jenise V Bacon Menshew, PA-C  cromolyn (OPTICROM) 4 % ophthalmic solution Place 1 drop into the left eye 4 (four) times daily. 03/11/16   Phineas SemenGraydon Melton Walls, MD  fluticasone (FLONASE) 50 MCG/ACT nasal spray Place 2 sprays into both nostrils daily. 03/09/16   Jenise V Bacon Menshew, PA-C  Prenatal Vit-Fe Fumarate-FA (PRENATAL MULTIVITAMIN) TABS tablet Take 1 tablet by mouth daily at 12 noon.     Historical Provider, MD    Allergies Latex  Family History  Problem Relation Age of Onset  . Suicidality Father     2011  . Diabetes Maternal Grandfather     Social History Social History  Substance Use Topics  . Smoking status: Never Smoker  . Smokeless tobacco: Never Used  . Alcohol use Yes     Comment: SOCIALLY    Review of Systems  Constitutional: Negative for fever. Cardiovascular: Negative for chest pain. Respiratory: Negative for shortness of breath. Gastrointestinal: Negative for abdominal pain, vomiting and diarrhea. Neurological: Negative for headaches, focal weakness or numbness.   10-point ROS otherwise negative.  ____________________________________________   PHYSICAL EXAM:  VITAL SIGNS: ED Triage Vitals  Enc Vitals Group     BP 03/11/16 1839 119/74     Pulse Rate 03/11/16 1839 78     Resp 03/11/16 1839 18     Temp 03/11/16 1837 98.3 F (36.8 C)     Temp Source 03/11/16 1837 Oral     SpO2 03/11/16 1839 99 %     Weight 03/11/16 1842 210 lb (95.3 kg)     Height 03/11/16 1842 5\' 3"  (1.6 m)     Head Circumference --      Peak Flow --      Pain Score 03/11/16 1842 0   Constitutional: Alert and oriented. Well appearing and in no distress. Eyes: Left conjunctiva is slightly injected. No discharge appreciated. Right eye within normal limits. Extraocular motions intact. ENT  Head: Normocephalic and atraumatic.   Nose: Mild amount of congestion and rhinorrhea   Mouth/Throat: Mucous membranes are moist.   Neck: No stridor. Hematological/Lymphatic/Immunilogical: No cervical lymphadenopathy. Cardiovascular: Normal rate, regular rhythm.  No murmurs, rubs, or gallops. Respiratory: Normal respiratory effort without tachypnea nor retractions. Breath sounds are clear and equal bilaterally. No wheezes/rales/rhonchi. Gastrointestinal: Soft and nontender. No distention.  Genitourinary: Deferred Musculoskeletal: Normal range of motion in all  extremities. No lower extremity edema. Neurologic:  Normal speech and language. No gross focal neurologic deficits are appreciated.  Skin:  Skin is warm, dry and intact. No rash noted. Psychiatric: Mood and affect are normal. Speech and behavior are normal. Patient exhibits appropriate insight and judgment.  ____________________________________________    LABS (pertinent positives/negatives)  Labs Reviewed - No data to display   ____________________________________________   EKG  None  ____________________________________________    RADIOLOGY  None  ____________________________________________   PROCEDURES  Procedures  ____________________________________________   INITIAL IMPRESSION / ASSESSMENT AND PLAN / ED COURSE  Pertinent labs & imaging results that were available during my care of the patient were reviewed by me and considered in my medical decision making (see chart for details).  Patient presents to the emergency department with continued symptoms of upper respiratory infection. Did have a discussion with the patient. Do recommend that she try many pot with cleaning and sterile water. Additionally will give patient prescription for eyedrops. Will give patient ENT follow-up. ____________________________________________   FINAL CLINICAL IMPRESSION(S) / ED DIAGNOSES  Final diagnoses:  URI (upper respiratory infection)     Note: This dictation was prepared with Dragon dictation. Any transcriptional errors that result from this process are unintentional    Phineas Semen, MD 03/11/16 2244

## 2016-03-11 NOTE — ED Triage Notes (Signed)
C/O cough and sinus congestion x 1 week. Seen through ED on Sunday and started on Amoxicillin.  Symptoms have continued and patient has been taking benadryl in addition to amoxicillin.  Also, has not felt any fetal movement in 2 days.  EDC:  08/07/2016 -- 18 weeks 5 Days gestation.  P2 G1

## 2016-03-11 NOTE — ED Notes (Signed)
Pt discharged to home.  Family member driving.  Discharge instructions reviewed.  Verbalized understanding.  No questions or concerns at this time.  Teach back verified.  Pt in NAD.  No items left in ED.   

## 2016-03-11 NOTE — Discharge Instructions (Signed)
Please seek medical attention for any high fevers, chest pain, shortness of breath, change in behavior, persistent vomiting, bloody stool or any other new or concerning symptoms.  

## 2016-03-13 ENCOUNTER — Emergency Department (HOSPITAL_COMMUNITY)
Admission: EM | Admit: 2016-03-13 | Discharge: 2016-03-13 | Disposition: A | Discharge status: Home / Self Care | Payer: Medicaid Other | Attending: Emergency Medicine | Admitting: Emergency Medicine

## 2016-03-13 ENCOUNTER — Encounter (HOSPITAL_COMMUNITY): Payer: Self-pay | Admitting: Nurse Practitioner

## 2016-03-13 ENCOUNTER — Ambulatory Visit: Payer: Self-pay

## 2016-03-13 DIAGNOSIS — Z7951 Long term (current) use of inhaled steroids: Secondary | ICD-10-CM | POA: Diagnosis not present

## 2016-03-13 DIAGNOSIS — Z79899 Other long term (current) drug therapy: Secondary | ICD-10-CM | POA: Diagnosis not present

## 2016-03-13 DIAGNOSIS — R55 Syncope and collapse: Secondary | ICD-10-CM | POA: Diagnosis not present

## 2016-03-13 DIAGNOSIS — Z3A19 19 weeks gestation of pregnancy: Secondary | ICD-10-CM | POA: Insufficient documentation

## 2016-03-13 DIAGNOSIS — R42 Dizziness and giddiness: Secondary | ICD-10-CM | POA: Diagnosis present

## 2016-03-13 DIAGNOSIS — O26892 Other specified pregnancy related conditions, second trimester: Secondary | ICD-10-CM | POA: Diagnosis not present

## 2016-03-13 DIAGNOSIS — Z3490 Encounter for supervision of normal pregnancy, unspecified, unspecified trimester: Secondary | ICD-10-CM

## 2016-03-13 LAB — URINE MICROSCOPIC-ADD ON

## 2016-03-13 LAB — COMPREHENSIVE METABOLIC PANEL
ALBUMIN: 3.6 g/dL (ref 3.5–5.0)
ALK PHOS: 66 U/L (ref 38–126)
ALT: 50 U/L (ref 14–54)
ANION GAP: 7 (ref 5–15)
AST: 44 U/L — AB (ref 15–41)
BILIRUBIN TOTAL: 0.5 mg/dL (ref 0.3–1.2)
BUN: 9 mg/dL (ref 6–20)
CO2: 23 mmol/L (ref 22–32)
Calcium: 9.1 mg/dL (ref 8.9–10.3)
Chloride: 105 mmol/L (ref 101–111)
Creatinine, Ser: 0.71 mg/dL (ref 0.44–1.00)
GFR calc Af Amer: >60 mL/min (ref >60)
GFR calc non Af Amer: >60 mL/min (ref >60)
GLUCOSE: 128 mg/dL — AB (ref 65–99)
POTASSIUM: 3.3 mmol/L — AB (ref 3.5–5.1)
Sodium: 135 mmol/L (ref 135–145)
TOTAL PROTEIN: 7.5 g/dL (ref 6.5–8.1)

## 2016-03-13 LAB — URINALYSIS, ROUTINE W REFLEX MICROSCOPIC
GLUCOSE, UA: NEGATIVE mg/dL
HGB URINE DIPSTICK: NEGATIVE
Ketones, ur: NEGATIVE mg/dL
Nitrite: POSITIVE — AB
PH: 5 (ref 5.0–8.0)
Protein, ur: 30 mg/dL — AB

## 2016-03-13 LAB — CBC WITH DIFFERENTIAL/PLATELET
Basophils Absolute: 0 10*3/uL (ref 0.0–0.1)
Basophils Relative: 0 %
EOS PCT: 1 %
Eosinophils Absolute: 0.1 10*3/uL (ref 0.0–0.7)
HCT: 36.5 % (ref 36.0–46.0)
Hemoglobin: 12.6 g/dL (ref 12.0–15.0)
LYMPHS ABS: 1 10*3/uL (ref 0.7–4.0)
LYMPHS PCT: 13 %
MCH: 31.1 pg (ref 26.0–34.0)
MCHC: 34.5 g/dL (ref 30.0–36.0)
MCV: 90.1 fL (ref 78.0–100.0)
MONO ABS: 0.3 10*3/uL (ref 0.1–1.0)
MONOS PCT: 4 %
Neutro Abs: 6.3 10*3/uL (ref 1.7–7.7)
Neutrophils Relative %: 82 %
PLATELETS: 162 10*3/uL (ref 150–400)
RBC: 4.05 MIL/uL (ref 3.87–5.11)
RDW: 14.1 % (ref 11.5–15.5)
WBC: 7.7 10*3/uL (ref 4.0–10.5)

## 2016-03-13 MED ORDER — SODIUM CHLORIDE 0.9 % IV BOLUS (SEPSIS)
1000.0000 mL | Freq: Once | INTRAVENOUS | Status: AC
  Administered 2016-03-13: 1000 mL via INTRAVENOUS

## 2016-03-13 MED ORDER — NITROFURANTOIN MONOHYD MACRO 100 MG PO CAPS: 100.0000 mg | ORAL_CAPSULE | Freq: Two times a day (BID) | ORAL | Status: DC

## 2016-03-13 NOTE — Discharge Instructions (Signed)
We saw you in the ER for the dizziness. All the results in the ER are normal, labs and imaging. We are not sure what is causing your symptoms. The workup in the ER is not complete, and is limited to screening for life threatening and emergent conditions only, so please see a primary care doctor for further evaluation.  Please return to the ER if you have worsening dizziness, you faint, shortness of breath, chest pain.

## 2016-03-13 NOTE — ED Notes (Signed)
Been extemly dizzy had sinus infection about a week ago was given antibotics seems like its not getting better maybe even worse

## 2016-03-13 NOTE — ED Provider Notes (Signed)
[facility]-EMERGENCY DEPT Provider Note   CSN: [id number] Arrival date & time: 03/13/16  [id number]     History   Chief Complaint Chief Complaint  Patient presents with  . Dizziness    19 weeks preg    HPI Paula Sullivan is a 30 y.o. female.  HPI Pt comes in with cc of dizziness. Pt is G4P2 > [redacted] weeks pregnant. No complications with the current pregnancy. Pt is s/p sinusitis treatment. Reports that she has continued to have malaise despite sinusitis getting better and fevers getting better. She came in to the ER today as she went back to work today and started feeling heavy and dizzy. She has had episode of near syncope. Her dizziness is intermittent, but present even when sitting sometimes. She had no chest pain, palpitations. Pt has no hx of PE, DVT and denies any exogenous estrogen use, long distance travels or surgery in the past 6 weeks, active cancer, recent immobilization. She denies any headaches, neck pain. Symptoms not worse with neck movement. She denies any heavy bleeding, emesis, diarrhea.    Past Medical History:  Diagnosis Date  . Type ab blood, rh negative     There are no active problems to display for this patient.   Past Surgical History:  Procedure Laterality Date  . CHOLECYSTECTOMY    . GALLBLADDER SURGERY  7-06    OB History    Gravida Para Term Preterm AB Living   4 1     2 1    SAB TAB Ectopic Multiple Live Births   1   1           Home Medications    Prior to Admission medications   Medication Sig Start Date End Date Taking? Authorizing Provider  acetaminophen (TYLENOL) 500 MG tablet Take 1,000 mg by mouth every 6 (six) hours as needed for mild pain or headache.   Yes Historical Provider, MD  amoxicillin (AMOXIL) 500 MG capsule Take 1 capsule (500 mg total) by mouth 3 (three) times daily. 03/09/16  Yes Jenise V Bacon Menshew, PA-C  cromolyn (OPTICROM) 4 % ophthalmic solution Place 1 drop into the left eye 4 (four) times daily. 03/11/16  Yes Phineas SemenGraydon  Goodman, MD  diphenhydrAMINE (BENADRYL) 25 MG tablet Take 25 mg by mouth every 4 (four) hours as needed for allergies.   Yes Historical Provider, MD  fluticasone (FLONASE) 50 MCG/ACT nasal spray Place 2 sprays into both nostrils daily. 03/09/16  Yes Jenise V Bacon Menshew, PA-C  Prenatal Vit-Fe Fumarate-FA (PRENATAL MULTIVITAMIN) TABS tablet Take 1 tablet by mouth daily.    Yes Historical Provider, MD    Family History Family History  Problem Relation Age of Onset  . Suicidality Father     2011  . Diabetes Maternal Grandfather     Social History Social History  Substance Use Topics  . Smoking status: Never Smoker  . Smokeless tobacco: Never Used  . Alcohol use Yes     Comment: SOCIALLY     Allergies   Latex   Review of Systems Review of Systems  ROS 10 Systems reviewed and are negative for acute change except as noted in the HPI.     Physical Exam Updated Vital Signs BP 109/62 (BP Location: Right Arm)   Pulse 79   Temp 98.1 F (36.7 C) (Oral)   Resp 16   Ht 5\' 3"  (1.6 m)   Wt 210 lb (95.3 kg)   LMP 11/04/2015   SpO2 100%   BMI 37.20  kg/m   Physical Exam  Constitutional: She is oriented to person, place, and time. She appears well-developed.  HENT:  Head: Normocephalic and atraumatic.  Eyes: Conjunctivae and EOM are normal. Pupils are equal, round, and reactive to light.  Neck: Normal range of motion. Neck supple. No JVD present.  Cardiovascular: Normal rate, regular rhythm, normal heart sounds and intact distal pulses.   No murmur heard. Pulmonary/Chest: Effort normal and breath sounds normal. No respiratory distress.  Abdominal: Soft. Bowel sounds are normal. She exhibits no distension. There is no tenderness. There is no rebound and no guarding.  Musculoskeletal: She exhibits no edema or tenderness.  Neurological: She is alert and oriented to person, place, and time.  Skin: Skin is warm and dry. Capillary refill takes less than 2 seconds. No rash noted.   Nursing note and vitals reviewed.    ED Treatments / Results  Labs (all labs ordered are listed, but only abnormal results are displayed) Labs Reviewed  URINALYSIS, ROUTINE W REFLEX MICROSCOPIC (NOT AT [facility]) - Abnormal; Notable for the following:       Result Value   Color, Urine ORANGE (*)    APPearance CLOUDY (*)    Specific Gravity, Urine >1.046 (*)    Bilirubin Urine SMALL (*)    Protein, ur 30 (*)    Nitrite POSITIVE (*)    Leukocytes, UA SMALL (*)    All other components within normal limits  COMPREHENSIVE METABOLIC PANEL - Abnormal; Notable for the following:    Potassium 3.3 (*)    Glucose, Bld 128 (*)    AST 44 (*)    All other components within normal limits  URINE MICROSCOPIC-ADD ON - Abnormal; Notable for the following:    Squamous Epithelial / LPF 0-5 (*)    Bacteria, UA MANY (*)    Crystals CA OXALATE CRYSTALS (*)    All other components within normal limits  URINE CULTURE  CBC WITH DIFFERENTIAL/PLATELET    EKG  EKG Interpretation  Date/Time:  Thursday March 13 2016 12:56:08 EDT Ventricular Rate:  70 PR Interval:    QRS Duration: 104 QT Interval:  388 QTC Calculation: 419 R Axis:   34 Text Interpretation:  Sinus rhythm No acute changes No old tracing to compare T wave flattening in lead III, no pronounced s1q3t3 no signs of right sided strain Confirmed by Rhunette Croft, MD, Janey Genta [phone]) on 03/13/2016 1:37:57 PM       Radiology No results found.  Procedures Procedures (including critical care time)  Medications Ordered in ED Medications  sodium chloride 0.9 % bolus 1,000 mL (0 mLs Intravenous Stopped 03/13/16 1127)  sodium chloride 0.9 % bolus 1,000 mL (0 mLs Intravenous Stopped 03/13/16 1303)     Initial Impression / Assessment and Plan / ED Course  I have reviewed the triage vital signs and the nursing notes.  Pertinent labs & imaging results that were available during my care of the patient were reviewed by me and considered in my medical  decision making (see chart for details).  Clinical Course  Comment By Time  Pt feels better, but still get dizzy when she walks, and on occasion has had near syncope. No CAD hx, no hx of premature CAD or dysrhythmias in the family. Pt has no numbness, tingling. No vertigo. She has no headache or neck pain. Pt doesn't have any risk factors for PE, as we went over them again, besides being pregnant, but she has no objective signs on vitals that make [place] overly  concerned. Plan is to get ekg and ambulatory pulsox. FHT are also ordered. Pt aware that we entertained the idea of critical conditions like brain AN, dysrhythmias, PE, strokes etc - but currently feel that the symptoms are more orthostatic and the risk for further workup doesn't outweigh the benefit. Strict ER return precautions have been discussed, and patient is agreeing with the plan and is comfortable with the workup done and the recommendations from the ER. Pt appreciates the care. Derwood Kaplan, MD 08/31 1235    DDx includes: Orthostatic hypotension Stroke Brain AN Dysrhythmia PE Vasovagal/neurocardiogenic syncope IVC compression Valvular disorder/Cardiomyopathy Anemia  Pt comes in with dizziness, near syncope. She is pregnant. Symptoms not fitting classic profile or orthostatics, as she reports that symptoms usually come when she is working, and dont go away immediately after sitting. She also has no diarrhea, emesis, poor intake. PE pretest probability is low, and there is no chest pain, dib, signs of DVT. Abd exam is normal and non tender. UA ordered for hydration - and it is nitrite + and has many bacteria. Also she has high specific gracity gravity, so we will still hydrate. Pt reports that she does have some discomfort with urination, so we will start her on macrobid. We will hydrate her further. FHT ordered. Reassess post hydration.       Final Clinical Impressions(s) / ED Diagnoses   Final diagnoses:  Dizziness    Near syncope  Pregnancy    New Prescriptions Discharge Medication List as of 03/13/2016 12:54 PM       Derwood Kaplan, MD 03/13/16 1345

## 2016-03-14 NOTE — ED Provider Notes (Signed)
[facility] Emergency Department Provider Note ____________________________________________  Time seen: 2136  I have reviewed the triage vital signs and the nursing notes.  HISTORY  Chief Complaint  Fever; Cough; and Otalgia  HPI Paula Sullivan is a 30 y.o. female presents to the ED with complaints of sinus congestion, cough, and earache as well as some low-grade fevers over the last 6 days. She denies any bloody nasal discharge, nausea, vomiting, or dizziness. She does report a productive cough with yellow mucus as well. The patient is [redacted] weeks pregnant but reports routine prenatal care and denies anychest pain, shortness of breath, or wheezing. She denies any sick contacts, recent travel, or any other exposures.  Past Medical History:  Diagnosis Date  . Type ab blood, rh negative     There are no active problems to display for this patient.   Past Surgical History:  Procedure Laterality Date  . CHOLECYSTECTOMY    . GALLBLADDER SURGERY  7-06    Prior to Admission medications   Medication Sig Start Date End Date Taking? Authorizing Provider  acetaminophen (TYLENOL) 500 MG tablet Take 1,000 mg by mouth every 6 (six) hours as needed for mild pain or headache.    Historical Provider, MD  amoxicillin (AMOXIL) 500 MG capsule Take 1 capsule (500 mg total) by mouth 3 (three) times daily. 03/09/16   Axcel Horsch V Bacon Jettie Lazare, PA-C  cromolyn (OPTICROM) 4 % ophthalmic solution Place 1 drop into the left eye 4 (four) times daily. 03/11/16   Phineas SemenGraydon Goodman, MD  diphenhydrAMINE (BENADRYL) 25 MG tablet Take 25 mg by mouth every 4 (four) hours as needed for allergies.    Historical Provider, MD  fluticasone (FLONASE) 50 MCG/ACT nasal spray Place 2 sprays into both nostrils daily. 03/09/16   Marquia Costello V Bacon Oceana Walthall, PA-C  Prenatal Vit-Fe Fumarate-FA (PRENATAL MULTIVITAMIN) TABS tablet Take 1 tablet by mouth daily.     Historical Provider, MD    Allergies Latex  Family  History  Problem Relation Age of Onset  . Suicidality Father     2011  . Diabetes Maternal Grandfather     Social History Social History  Substance Use Topics  . Smoking status: Never Smoker  . Smokeless tobacco: Never Used  . Alcohol use Yes     Comment: SOCIALLY    Review of Systems  Constitutional: Negative for fever. Eyes: Negative for visual changes. ENT: Negative for sore throat. Reports sinus congestion and ear pressure. Cardiovascular: Negative for chest pain. Respiratory: Negative for shortness of breath. Gastrointestinal: Negative for abdominal pain, vomiting and diarrhea. Neurological: Negative for headaches, focal weakness or numbness. ____________________________________________  PHYSICAL EXAM:  VITAL SIGNS: ED Triage Vitals  Enc Vitals Group     BP 03/09/16 2055 123/75     Pulse Rate 03/09/16 2055 85     Resp 03/09/16 2055 18     Temp 03/09/16 2055 98.2 F (36.8 C)     Temp Source 03/09/16 2055 Oral     SpO2 03/09/16 2055 99 %     Weight 03/09/16 2056 210 lb (95.3 kg)     Height 03/09/16 2056 5\' 3"  (1.6 m)     Head Circumference --      Peak Flow --      Pain Score 03/09/16 2056 6     Pain Loc --      Pain Edu? --      Excl. in GC? --    Constitutional: Alert and oriented. Well appearing and in  no distress. Head: Normocephalic and atraumatic.      Eyes: Conjunctivae are normal. PERRL. Normal extraocular movements      Ears: Canals clear. TMs intact bilaterally. No bulging, retraction, erythema, or effusion noted.   Nose: No congestion. Clear rhinorrhea.   Mouth/Throat: Mucous membranes are moist. Uvula is midline and tonsils are without erythema, edema, or exudate.   Neck: Supple. No thyromegaly. Hematological/Lymphatic/Immunological: No cervical lymphadenopathy. Cardiovascular: Normal rate, regular rhythm.  Respiratory: Normal respiratory effort. No wheezes/rales/rhonchi. Gastrointestinal: Soft and nontender. No  distention. ____________________________________________  INITIAL IMPRESSION / ASSESSMENT AND PLAN / ED COURSE  Patient with apparent presentation consistent with an upper respiratory infection and possible acute maxillary sinusitis. The duration of her symptoms may be reasonable to treat her for an acute sinusitis. She is advised if she does start the medication she must complete the course and is entirely. She is further advised that nasal steroids as well as nondrowsy allergy medicines are thick and pregnancy. She is also advised to hydrate well and to follow-up with primary care provider for ongoing symptom management.  Clinical Course   ____________________________________________  FINAL CLINICAL IMPRESSION(S) / ED DIAGNOSES  Final diagnoses:  URI (upper respiratory infection)  Acute maxillary sinusitis, recurrence not specified      Lissa Hoard, PA-C 03/14/16 1913    Nita Sickle, MD 03/16/16 [phone]

## 2016-03-15 LAB — URINE CULTURE

## 2016-03-16 ENCOUNTER — Telehealth (HOSPITAL_BASED_OUTPATIENT_CLINIC_OR_DEPARTMENT_OTHER): Payer: Self-pay

## 2016-03-16 NOTE — Telephone Encounter (Signed)
Post ED Visit - Positive Culture Follow-up: Unsuccessful Patient Follow-up  Culture assessed and recommendations reviewed by: []  Enzo BiNathan Batchelder, Pharm.D. []  Celedonio MiyamotoJeremy Frens, Pharm.D., BCPS []  Garvin FilaMike Maccia, Pharm.D. []  Georgina PillionElizabeth Martin, Pharm.D., BCPS []  KalevaMinh Pham, [place]., BCPS, AAHIVP []  Estella HuskMichelle Turner, Pharm.D., BCPS, AAHIVP []  Cassie Stewart, [address] []  Sherle Poeob Vincent, [address] Lauren Bajbous Pharm D Positive urine culture  []  Patient discharged without antimicrobial prescription and treatment is now indicated [x]  Organism is resistant to prescribed ED discharge antimicrobial []  Patient with positive blood cultures   Unable to contact patient after 3 attempts, letter will be sent to address on file  Jerry CarasCullom, Latoyna Hird Burnett 03/16/2016, 1:08 PM

## 2016-03-22 ENCOUNTER — Telehealth (HOSPITAL_BASED_OUTPATIENT_CLINIC_OR_DEPARTMENT_OTHER): Payer: Self-pay

## 2016-03-22 NOTE — Telephone Encounter (Signed)
Returned call from letter sent to home. Pt states that OB/GYN started her on Abx treatment.

## 2016-04-28 LAB — OB RESULTS CONSOLE HEPATITIS B SURFACE ANTIGEN: Hepatitis B Surface Ag: NEGATIVE

## 2016-04-28 LAB — OB RESULTS CONSOLE RUBELLA ANTIBODY, IGM: Rubella: IMMUNE

## 2016-04-28 LAB — OB RESULTS CONSOLE VARICELLA ZOSTER ANTIBODY, IGG: VARICELLA IGG: IMMUNE

## 2016-05-05 ENCOUNTER — Encounter: Payer: Self-pay | Admitting: *Deleted

## 2016-05-05 ENCOUNTER — Inpatient Hospital Stay
Admission: EM | Admit: 2016-05-05 | Discharge: 2016-05-05 | Disposition: A | Discharge status: Home / Self Care | Payer: Medicaid Other | Attending: Obstetrics & Gynecology | Admitting: Obstetrics & Gynecology

## 2016-05-05 DIAGNOSIS — O368120 Decreased fetal movements, second trimester, not applicable or unspecified: Secondary | ICD-10-CM | POA: Diagnosis present

## 2016-05-05 DIAGNOSIS — Z3A24 24 weeks gestation of pregnancy: Secondary | ICD-10-CM | POA: Diagnosis not present

## 2016-05-05 DIAGNOSIS — O26892 Other specified pregnancy related conditions, second trimester: Secondary | ICD-10-CM | POA: Insufficient documentation

## 2016-05-05 DIAGNOSIS — M545 Low back pain: Secondary | ICD-10-CM | POA: Diagnosis present

## 2016-05-05 LAB — URINALYSIS COMPLETE WITH MICROSCOPIC (ARMC ONLY)
Bilirubin Urine: NEGATIVE
Glucose, UA: NEGATIVE mg/dL
Hgb urine dipstick: NEGATIVE
LEUKOCYTES UA: NEGATIVE
NITRITE: NEGATIVE
PH: 5 (ref 5.0–8.0)
PROTEIN: 30 mg/dL — AB
SPECIFIC GRAVITY, URINE: 1.036 — AB (ref 1.005–1.030)

## 2016-05-05 NOTE — OB Triage Note (Signed)
Pt recvd from ED. Pt c/o contractions since 2:00pm, started timing them about 10-15 min apart. No intercourse in the past 24 hours. Stayed well hydrated. Feeling baby less than normal. Pt states she worked hard and was on her feet all day and was sweating a lot.

## 2016-05-05 NOTE — Final Progress Note (Signed)
Physician Final Progress Note  Patient ID: Paula MalletMica M Helderman MRN: [id number] DOB/AGE: 1985/10/08 30 y.o.  Admit date: 05/05/2016 Admitting provider: Nadara Mustardobert P Harris, MD Discharge date: 05/05/2016   Admission Diagnoses: IUP at 24wk5d with lower back pain and decreased fetal movement.  Discharge Diagnoses:  IUP at 24.5 weeks Reactive NST Lower back pain probably due to MSK etiology   Consults:none  Significant Findings/ Diagnostic Studies: 30 year old G3 P1011 with EDC=08/20/2016 by a 6wk ultrasound presented to L&D with complaints of decreased movement today and lower back pain. 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 She came home and took Tylenol and put a heating pad on her back which did not relieve her lower back pain. Her urine has looked dark and she has been having urinary frequency, but no dysuria. Some increased white discharge, no itching.  [facility] remarkable for a UTI this pregnancy, being RH negative. And having a low lying placenta within one cm of the cervical os OB History: SVD in 2005-baby placed for adoption Past medical Hx: ADD Past Surgical Hx: cholecystectomy Social Hx: Single, nonsmoker Family Hx: mother with ovarian cancer  Exam: BP 130/73 (BP Location: Right Arm)   Pulse 79   Temp 97.8 F (36.6 C) (Oral)   Resp 16   LMP 11/04/2015   General: smiling, pleasant, in NAD Abdomen: soft, NT FHR: 130 with accelerations to 150s to 160s, moderate variability. Baby active-patient began feeling movement after monitors applied. Toco: essentially acontractile Cervix: L/T/C/OOP  Results for orders placed or performed during the hospital encounter of 05/05/16 (from the past 24 hour(s))  Urinalysis complete, with microscopic ([facility] only)     Status: Abnormal   Collection Time: 05/05/16  8:46 PM  Result Value Ref Range   Color, Urine YELLOW (A) YELLOW   APPearance CLEAR (A) CLEAR   Glucose, UA NEGATIVE NEGATIVE mg/dL   Bilirubin Urine  NEGATIVE NEGATIVE   Ketones, ur TRACE (A) NEGATIVE mg/dL   Specific Gravity, Urine 1.036 (H) 1.005 - 1.030   Hgb urine dipstick NEGATIVE NEGATIVE   pH 5.0 5.0 - 8.0   Protein, ur 30 (A) NEGATIVE mg/dL   Nitrite NEGATIVE NEGATIVE   Leukocytes, UA NEGATIVE NEGATIVE   RBC / HPF 0-5 0 - 5 RBC/hpf   WBC, UA 0-5 0 - 5 WBC/hpf   Bacteria, UA RARE (A) NONE SEEN   Squamous Epithelial / LPF 0-5 (A) NONE SEEN   Mucous PRESENT    Ca Oxalate Crys, UA PRESENT   wet prep: negative for hyphae, Trich, clue cells   Procedures: none  Discharge Condition: stable  Disposition: 01-Home or Self Care  Diet: Regular diet  Discharge Activity: Activity as tolerated. Advised may need to decrease work. Can apply Biofreeze/ capsazin to back. Recommend maternity support garment and drinking more fluids     Medication List    TAKE these medications   acetaminophen 500 MG tablet Commonly known as:  TYLENOL Take 1,000 mg by mouth every 6 (six) hours as needed for mild pain or headache.   cromolyn 4 % ophthalmic solution Commonly known as:  OPTICROM Place 1 drop into the left eye 4 (four) times daily.   diphenhydrAMINE 25 MG tablet Commonly known as:  BENADRYL Take 25 mg by mouth every 4 (four) hours as needed for allergies.   fluticasone 50 MCG/ACT nasal spray Commonly known as:  FLONASE Place 2 sprays into both nostrils daily.   prenatal multivitamin Tabs tablet Take  1 tablet by mouth daily.      Follow up at [facility] as scheduled  Total time spent taking care of this patient: 15 minutes  Signed: Farrel Conners, CNM Maebelle Sulton 05/05/2016, 9:29 PM

## 2016-05-05 NOTE — Discharge Instructions (Signed)
Come back if:  Decreased fetal movement Temp over 100.4 Heavy vaginal bleeding Contractions every 3-5 min lasting at least one hour Big gush of fluid  Get plenty of rest and stay well hydrated!

## 2016-05-26 LAB — OB RESULTS CONSOLE RPR: RPR: NONREACTIVE

## 2016-05-26 LAB — OB RESULTS CONSOLE ANTIBODY SCREEN: Antibody Screen: NEGATIVE

## 2016-05-26 LAB — OB RESULTS CONSOLE HIV ANTIBODY (ROUTINE TESTING): HIV: NONREACTIVE

## 2016-06-05 ENCOUNTER — Observation Stay
Admission: EM | Admit: 2016-06-05 | Discharge: 2016-06-05 | Disposition: A | Discharge status: Home / Self Care | Payer: Medicaid Other | Attending: Obstetrics & Gynecology | Admitting: Obstetrics & Gynecology

## 2016-06-05 ENCOUNTER — Encounter: Payer: Self-pay | Admitting: *Deleted

## 2016-06-05 DIAGNOSIS — R109 Unspecified abdominal pain: Secondary | ICD-10-CM | POA: Diagnosis not present

## 2016-06-05 DIAGNOSIS — Z3A00 Weeks of gestation of pregnancy not specified: Secondary | ICD-10-CM | POA: Diagnosis not present

## 2016-06-05 DIAGNOSIS — O26893 Other specified pregnancy related conditions, third trimester: Principal | ICD-10-CM | POA: Insufficient documentation

## 2016-06-05 DIAGNOSIS — O26899 Other specified pregnancy related conditions, unspecified trimester: Secondary | ICD-10-CM | POA: Diagnosis present

## 2016-06-05 DIAGNOSIS — O2693 Pregnancy related conditions, unspecified, third trimester: Secondary | ICD-10-CM | POA: Diagnosis present

## 2016-06-05 LAB — URINALYSIS COMPLETE WITH MICROSCOPIC (ARMC ONLY)
Bilirubin Urine: NEGATIVE
Glucose, UA: NEGATIVE mg/dL
HGB URINE DIPSTICK: NEGATIVE
Leukocytes, UA: NEGATIVE
NITRITE: NEGATIVE
PH: 6 (ref 5.0–8.0)
PROTEIN: NEGATIVE mg/dL
SPECIFIC GRAVITY, URINE: 1.006 (ref 1.005–1.030)

## 2016-06-05 MED ORDER — ONDANSETRON HCL 4 MG/2ML IJ SOLN: 4.0000 mg | Freq: Four times a day (QID) | INTRAMUSCULAR | Status: DC | PRN

## 2016-06-05 MED ORDER — ACETAMINOPHEN 325 MG PO TABS
650.0000 mg | ORAL_TABLET | ORAL | Status: DC | PRN
Start: 2016-06-05 — End: 2016-06-05

## 2016-06-05 NOTE — Discharge Summary (Signed)
Patient discharged home, discharge instructions given, patient states understanding. Patient left floor in stable condition, denies any other needs at this time. Patient to keep next scheduled OB appointment 

## 2016-06-05 NOTE — OB Triage Note (Signed)
Patient complains of lower back pain and pelvic pain with walking and standing. Pt also states she had a one time small gush of fluid last night. Pt also states she has been nauseated and thrown up a few times and has had diarrhea for a couple of days. Baby active  Denies any bleeding or other complaints

## 2016-06-05 NOTE — Discharge Instructions (Signed)
Drink plenty of fluid and get plenty of rest, call your provider for any other concerns °

## 2016-07-13 ENCOUNTER — Observation Stay
Admission: EM | Admit: 2016-07-13 | Discharge: 2016-07-13 | Disposition: A | Discharge status: Home / Self Care | Payer: Medicaid Other | Attending: Certified Nurse Midwife | Admitting: Certified Nurse Midwife

## 2016-07-13 ENCOUNTER — Encounter: Payer: Self-pay | Admitting: *Deleted

## 2016-07-13 DIAGNOSIS — Z3A34 34 weeks gestation of pregnancy: Secondary | ICD-10-CM | POA: Insufficient documentation

## 2016-07-13 DIAGNOSIS — O26893 Other specified pregnancy related conditions, third trimester: Secondary | ICD-10-CM | POA: Diagnosis not present

## 2016-07-13 DIAGNOSIS — O99213 Obesity complicating pregnancy, third trimester: Secondary | ICD-10-CM | POA: Diagnosis not present

## 2016-07-13 DIAGNOSIS — R1030 Lower abdominal pain, unspecified: Secondary | ICD-10-CM | POA: Diagnosis present

## 2016-07-13 DIAGNOSIS — R109 Unspecified abdominal pain: Secondary | ICD-10-CM | POA: Diagnosis not present

## 2016-07-13 DIAGNOSIS — Z6838 Body mass index (BMI) 38.0-38.9, adult: Secondary | ICD-10-CM | POA: Diagnosis not present

## 2016-07-13 LAB — URINALYSIS, COMPLETE (UACMP) WITH MICROSCOPIC
Bacteria, UA: NONE SEEN
Bilirubin Urine: NEGATIVE
GLUCOSE, UA: NEGATIVE mg/dL
Hgb urine dipstick: NEGATIVE
Ketones, ur: NEGATIVE mg/dL
Leukocytes, UA: NEGATIVE
NITRITE: NEGATIVE
PROTEIN: NEGATIVE mg/dL
SPECIFIC GRAVITY, URINE: 1.014 (ref 1.005–1.030)
pH: 6 (ref 5.0–8.0)

## 2016-07-13 NOTE — Final Progress Note (Addendum)
Physician Final Progress Note  Patient ID: Paula MalletMica M Resendes MRN: [id number] DOB/AGE: 32-Jul-1987 32 y.o.  Admit date: 07/13/2016 Admitting provider: Conard NovakStephen D Jackson, MD Discharge date: 07/13/2016   Admission Diagnoses: IUP at 34wk4d with lower abdominal cramping  Discharge Diagnoses:  IUP at 34.4 weeks with lower abdominal cramping  Consults: none  Significant Findings/ Diagnostic Studies: 30 yo G3 P1011 with EDC=08/20/2016 presented at 34.4 weeks with complaints of lower abdominal cramping x3 days. Cramping radiates down thighs. Has been having some sacral back pain.  Denies vaginal itching or vulvar irritation. No dysuria, but having trouble starting the stream. Had a little brown discharge last week when wiping. Has been on Amoxicillin for URI.  [facility]: [facility] also remarkable for obesity (BMI=38.8)and being RH negative (received Rhogam). Exam: BP 119/69 (BP Location: Right Arm)   Pulse (!) 104   Temp 98.8 F (37.1 C) (Oral)   Resp 16   Ht 5\' 3"  (1.6 m)   Wt 216 lb (98 kg)   LMP 11/04/2015   BMI 38.26 kg/m   General: comfortable, in NAD Abdomen: soft, NT, cephalic on Leopold's Vulva: no inflammation or lesions Vagina: clear mucoid discharge. Neg Nitrazine Cervix: L/T/C/-3 Back: tenderness over SI joints FHR: 135 with accelerations to 150s, moderate variability Toco: one contraction seen  Results for orders placed or performed during the hospital encounter of 07/13/16 (from the past 24 hour(s))  Urinalysis, Complete w Microscopic     Status: Abnormal   Collection Time: 07/13/16  7:06 PM  Result Value Ref Range   Color, Urine YELLOW (A) YELLOW   APPearance CLEAR (A) CLEAR   Specific Gravity, Urine 1.014 1.005 - 1.030   pH 6.0 5.0 - 8.0   Glucose, UA NEGATIVE NEGATIVE mg/dL   Hgb urine dipstick NEGATIVE NEGATIVE   Bilirubin Urine NEGATIVE NEGATIVE   Ketones, ur NEGATIVE NEGATIVE mg/dL   Protein, ur NEGATIVE NEGATIVE mg/dL   Nitrite NEGATIVE NEGATIVE   Leukocytes,  UA NEGATIVE NEGATIVE   RBC / HPF 0-5 0 - 5 RBC/hpf   WBC, UA 0-5 0 - 5 WBC/hpf   Bacteria, UA NONE SEEN NONE SEEN   Squamous Epithelial / LPF 0-5 (A) NONE SEEN   Mucous PRESENT    A: IUP at 34.4 weeks with discomforts of advancing pregnancy FWB-Cat 1 tracing  P: Dc home [facility] at 12 Jan [facility]  Procedures:none  Discharge Condition: stable  Disposition: 01-Home or Self Care  Diet: Regular diet  Discharge Activity: Activity as tolerated    Follow-up Information    [facility] Follow up on 07/25/2016.   Contact information: [address] [place] [address] [id number] [id number]           Total time spent taking care of this patient: 20 minutes  Signed: Farrel ConnersGUTIERREZ, Emoni Whitworth 07/13/2016, 7:58 PM

## 2016-07-13 NOTE — Discharge Instructions (Signed)
Call provider or return to birthplace with: ? ?1. Regular contractions ?2. Leaking of fluid from your vagina ?3. Vaginal bleeding: Bright red or heavy like a period ?4. Decreased Fetal movement  ?

## 2016-07-14 NOTE — L&D Delivery Note (Signed)
Delivery Note Primary OB: [facility] Delivery Physician: Annamarie MajorPaul Shayne Diguglielmo, MD Gestational Age: Full term Antepartum complications: none Intrapartum complications: None  A viable Female was delivered via vertex perentation.  Apgars:9 ,1  Weight:  Pending .   Placenta status: spontaneous and Intact.  Cord: 3+ vessels;  with the following complications: none.  Anesthesia:  epidural Episiotomy:  none Lacerations:  none Suture Repair: none Est. Blood Loss (mL):  less than 100 mL  Mom to postpartum.  Baby to Couplet care / Skin to Skin.  Annamarie MajorPaul Early Steel, MD Dept of OB/GYN [phone]

## 2016-07-18 NOTE — Discharge Summary (Signed)
See final progress note written by Sallyanne Kusterollleen Gutierrez, CNM

## 2016-07-23 ENCOUNTER — Observation Stay
Admission: EM | Admit: 2016-07-23 | Discharge: 2016-07-23 | Disposition: A | Discharge status: Home / Self Care | Payer: Medicaid Other | Attending: Certified Nurse Midwife | Admitting: Certified Nurse Midwife

## 2016-07-23 ENCOUNTER — Emergency Department
Admission: EM | Admit: 2016-07-23 | Discharge: 2016-07-23 | Discharge status: Absent without leave | Payer: Medicaid Other

## 2016-07-23 DIAGNOSIS — Y92009 Unspecified place in unspecified non-institutional (private) residence as the place of occurrence of the external cause: Secondary | ICD-10-CM | POA: Insufficient documentation

## 2016-07-23 DIAGNOSIS — O99513 Diseases of the respiratory system complicating pregnancy, third trimester: Secondary | ICD-10-CM | POA: Insufficient documentation

## 2016-07-23 DIAGNOSIS — T5891XA Toxic effect of carbon monoxide from unspecified source, accidental (unintentional), initial encounter: Secondary | ICD-10-CM | POA: Insufficient documentation

## 2016-07-23 DIAGNOSIS — O9989 Other specified diseases and conditions complicating pregnancy, childbirth and the puerperium: Secondary | ICD-10-CM

## 2016-07-23 DIAGNOSIS — O99891 Other specified diseases and conditions complicating pregnancy: Secondary | ICD-10-CM | POA: Diagnosis present

## 2016-07-23 DIAGNOSIS — M549 Dorsalgia, unspecified: Secondary | ICD-10-CM | POA: Diagnosis present

## 2016-07-23 DIAGNOSIS — R51 Headache: Secondary | ICD-10-CM

## 2016-07-23 DIAGNOSIS — J320 Chronic maxillary sinusitis: Secondary | ICD-10-CM

## 2016-07-23 DIAGNOSIS — F419 Anxiety disorder, unspecified: Secondary | ICD-10-CM | POA: Insufficient documentation

## 2016-07-23 DIAGNOSIS — Z3A36 36 weeks gestation of pregnancy: Secondary | ICD-10-CM | POA: Diagnosis not present

## 2016-07-23 DIAGNOSIS — R519 Headache, unspecified: Secondary | ICD-10-CM | POA: Diagnosis present

## 2016-07-23 DIAGNOSIS — Z91040 Latex allergy status: Secondary | ICD-10-CM | POA: Insufficient documentation

## 2016-07-23 DIAGNOSIS — O99343 Other mental disorders complicating pregnancy, third trimester: Principal | ICD-10-CM | POA: Insufficient documentation

## 2016-07-23 MED ORDER — GUAIFENESIN ER 600 MG PO TB12: 600.0000 mg | ORAL_TABLET | Freq: Two times a day (BID) | ORAL | 0 refills | Status: DC | PRN

## 2016-07-23 MED ORDER — AZITHROMYCIN 250 MG PO TABS: ORAL_TABLET | ORAL | 0 refills | Status: DC

## 2016-07-23 MED ORDER — SERTRALINE HCL 50 MG PO TABS: ORAL_TABLET | ORAL | 3 refills | Status: DC

## 2016-07-23 NOTE — OB Triage Note (Signed)
Patient presents with c/o ongoing headache and swelling, some SOB and pain behind right shoulder blade.  Presents in no apparent distress.  efm and toco applied.  fhr-stable  Pulse ox-98-100 on room air.  Without c/o cramping, ucs, or s/s srom.  Denies any vaginal bleeding or spotting.  Abdomen soft, non-tender.   Patient staying in hotel since 07/20/16 after carbon monoxide levels were high at home.  States feeling. "not right, tired all the time and headache for all pregnancy."  Provider aware of patient complaints.

## 2016-07-23 NOTE — Final Progress Note (Signed)
Physician Final Progress Note  Patient ID: Paula Sullivan MRN: [id number] DOB/AGE: [birth year] 31 y.o.  Admit date: 07/23/2016 Admitting provider: Vena Austria, MD Discharge date: 07/23/2016   Admission Diagnoses: SOB and cough Headaches IUP at 36 weeks Carbon monoxide exposure  Discharge Diagnoses:  IUP at 36 weeks Sinusitis, maxillary Anxiety Headaches  Consults: none  Significant Findings/ Diagnostic Studies: 31 year old G3 P1011 with EDC=08/20/2016 by a 6wk ultrasound presented to L&D initially with complaints of exposure to carbon monoxide 3 days ago, headaches, SOB, cough. She has been on Augmentin for sinusitis and just completed a 10 day course, but is still coughing up "chunks" of brown mucous. No current fever or sore throat, but having maxillary sinus pressure and pain. Found out that there heater exchange was not working correctly when their carbon monoxide detector alarmed on Sunday. She has been living in a hotel until her heater is fixed. Woke up this evening feeling tightness in her chest, like she was going to have a panic attack (has a hx of anxiety). Has been treated in the past with Xanax. Not currently on meds. Her husband works midnight shift and she is alone at night which adds to her anxiety. Complains of headaches, but has had headaches throughout her entire pregnancy. Baby active. Denies vaginal bleeding, LOF. P[facility]atal care at [facility] remarkable for being RH negative and receiving Rhogam at 28 weeks and history of anxiety and ADD. First baby delivered vaginally in 2005 given up for adoption  Exam:  Patient Vitals for the past 24 hrs:  BP Temp Temp src Pulse Resp SpO2  07/23/16 0215 - - - - - 98 %  07/23/16 0212 115/74 - - 78 - -  07/23/16 0210 - - - - - 98 %  07/23/16 0205 - - - - - 98 %  07/23/16 0200 - - - - - 98 %  07/23/16 0156 120/72 - - 77 - -  07/23/16 0155 - - - - - 99 %  07/23/16 0140 - - - - - 98 %  07/23/16 0136 129/77 97.5 F (36.4 C) Oral 77 18  -  General: WF, gravid, in NAD, but coughing frequently +maxillary sinus tenderness Heart: RRR without murmur Lungs: CTA Abdomen: gravid, soft, NT, cephalic on presentation FHR: 782 baseline with accelerations to 150s to 170s, moderate variability Toco: acontractile DTRs: +2 patellar reflexes Trace edema in lower extremities.  A: IUP at 36 weeks with reactive NST Normotensive Maxillary sinusitis Anxiety  P: RX for Zpak, Mucinex. Can use saline nose spray Discussed starting treatment for anxiety prior to birth of baby  Trial of Zoloft 25 mgm daily. Discussed how to take and possible side effects Follow up in 2 days at [facility]   Procedures: none  Discharge Condition: stable  Disposition: ED Dismiss - Never Arrived  Diet: Regular diet  Discharge Activity: Activity as tolerated  Discharge Instructions    Discharge patient    Complete by:  As directed    Discharge disposition:  01-Home or Self Care   Discharge patient date:  07/23/2016     Allergies as of 07/23/2016      Reactions   Latex Rash   Unknown childhood reaction.       Medication List    STOP taking these medications   amoxicillin-clavulanate 875-125 MG tablet Commonly known as:  AUGMENTIN     TAKE these medications   acetaminophen 500 MG tablet Commonly known as:  TYLENOL Take 1,000 mg by mouth every 6 (  six) hours as needed for mild pain or headache.   azithromycin 250 MG tablet Commonly known as:  ZITHROMAX Z-PAK Take 2 tabs today then one tab daily for 5 days   diphenhydrAMINE 25 MG tablet Commonly known as:  BENADRYL Take 25 mg by mouth every 4 (four) hours as needed for allergies.   fluticasone 50 MCG/ACT nasal spray Commonly known as:  FLONASE Place 2 sprays into both nostrils daily.   guaiFENesin 600 MG 12 hr tablet Commonly known as:  MUCINEX Take 1 tablet (600 mg total) by mouth 2 (two) times daily as needed.   prenatal multivitamin Tabs tablet Take 1 tablet by mouth daily.    sertraline 50 MG tablet Commonly known as:  ZOLOFT Take 1/2 tablet daily      patient completed Augmentin  Total time spent taking care of this patient: 20 minutes  Signed: Castle Lamons 07/23/2016, 3:00 AM

## 2016-07-28 LAB — OB RESULTS CONSOLE GBS: STREP GROUP B AG: NEGATIVE

## 2016-08-07 ENCOUNTER — Inpatient Hospital Stay
Admission: EM | Admit: 2016-08-07 | Discharge: 2016-08-08 | Disposition: A | Discharge status: Home / Self Care | Payer: Medicaid Other | Attending: Obstetrics & Gynecology | Admitting: Obstetrics & Gynecology

## 2016-08-07 DIAGNOSIS — Z3A38 38 weeks gestation of pregnancy: Secondary | ICD-10-CM | POA: Diagnosis not present

## 2016-08-07 DIAGNOSIS — Z79899 Other long term (current) drug therapy: Secondary | ICD-10-CM | POA: Insufficient documentation

## 2016-08-07 DIAGNOSIS — A084 Viral intestinal infection, unspecified: Secondary | ICD-10-CM | POA: Diagnosis not present

## 2016-08-07 DIAGNOSIS — R12 Heartburn: Secondary | ICD-10-CM | POA: Diagnosis present

## 2016-08-07 MED ORDER — LACTATED RINGERS IV BOLUS (SEPSIS)
1000.0000 mL | Freq: Once | INTRAVENOUS | Status: AC
  Administered 2016-08-07: 1000 mL via INTRAVENOUS

## 2016-08-07 MED ORDER — DEXTROSE IN LACTATED RINGERS 5 % IV SOLN
INTRAVENOUS | Status: DC
  Administered 2016-08-07: via INTRAVENOUS

## 2016-08-07 MED ORDER — ONDANSETRON HCL 4 MG/2ML IJ SOLN
INTRAMUSCULAR | Status: AC
  Administered 2016-08-07: 4 mg
  Filled 2016-08-07: qty 2

## 2016-08-07 MED ORDER — PROMETHAZINE HCL 25 MG/ML IJ SOLN
25.0000 mg | Freq: Four times a day (QID) | INTRAMUSCULAR | Status: DC | PRN
  Administered 2016-08-07 – 2016-08-08 (×3): 25 mg via INTRAVENOUS
  Filled 2016-08-07: qty 1

## 2016-08-07 MED ORDER — SODIUM CHLORIDE 0.9 % IJ SOLN
INTRAMUSCULAR | Status: AC
  Filled 2016-08-07: qty 50

## 2016-08-07 MED ORDER — LACTATED RINGERS IV SOLN
INTRAVENOUS | Status: DC
  Administered 2016-08-08: 08:00:00 via INTRAVENOUS

## 2016-08-07 MED ORDER — SODIUM CHLORIDE 0.9 % IV SOLN
8.0000 mg | INTRAVENOUS | Status: DC
  Filled 2016-08-07: qty 4

## 2016-08-07 MED ORDER — PROMETHAZINE HCL 25 MG/ML IJ SOLN
INTRAMUSCULAR | Status: AC
  Administered 2016-08-07 – 2016-08-08 (×2): 25 mg via INTRAVENOUS
  Filled 2016-08-07: qty 1

## 2016-08-07 NOTE — OB Triage Note (Signed)
Pt presents c/o lower abdominal pain rated 8/10 that comes and goes. She also states that she can't stop throwing up (at least 8 times). Pt states she has had diarrhea today. Denies bleeding. Pt states that before she came in, she vomited and had a big gush of brown fluid come out. Reports positive fetal movement. Pt was seen in the Dr. Isidore Moosffice this morning, but all her symptoms started later this afternoon.

## 2016-08-08 DIAGNOSIS — A084 Viral intestinal infection, unspecified: Secondary | ICD-10-CM | POA: Diagnosis not present

## 2016-08-08 MED ORDER — LOPERAMIDE HCL 2 MG PO CAPS
4.0000 mg | ORAL_CAPSULE | Freq: Once | ORAL | Status: AC
Start: 2016-08-08 — End: 2016-08-08
  Administered 2016-08-08: 4 mg via ORAL

## 2016-08-08 MED ORDER — LOPERAMIDE HCL 2 MG PO CAPS
ORAL_CAPSULE | ORAL | Status: AC
  Administered 2016-08-08: 4 mg via ORAL
  Filled 2016-08-08: qty 2

## 2016-08-08 MED ORDER — SODIUM CHLORIDE 0.9 % IV SOLN
8.0000 mg | Freq: Four times a day (QID) | INTRAVENOUS | Status: DC | PRN
  Administered 2016-08-08: 8 mg via INTRAVENOUS
  Filled 2016-08-08 (×2): qty 4

## 2016-08-08 MED ORDER — ONDANSETRON HCL 4 MG/2ML IJ SOLN
INTRAMUSCULAR | Status: AC
  Administered 2016-08-08: 4 mg
  Filled 2016-08-08: qty 2

## 2016-08-08 NOTE — H&P (Signed)
OB History & Physical   History of Present Illness:  Chief Complaint: nausea, vomiting, diarrhea  HPI:  Paula Sullivan is a 31 y.o. [phone] female at 766w2d.      She denies contractions.   She denies leakage of fluid.   She denies vaginal bleeding.   She reports fetal movement.    She presents with refractory nausea with emesis.  She also notes some mild mid-abdominal cramping that is bilateral.  She started having nausea early this afternoon.  She has been unable to keep any food down at all.  She also has been having diarrhea. She notes no sick contacts.  She denies fevers, chills.  Denies blood in her stool and vomitus.  She has tried no home medication.  Maternal Medical History:   Past Medical History:  Diagnosis Date  . Type AB blood, Rh negative     Past Surgical History:  Procedure Laterality Date  . CHOLECYSTECTOMY    . GALLBLADDER SURGERY  7-06    Allergies  Allergen Reactions  . Latex Rash    Unknown childhood reaction.     Prior to Admission medications   Medication Sig Start Date End Date Taking? Authorizing Provider  acetaminophen (TYLENOL) 500 MG tablet Take 1,000 mg by mouth every 6 (six) hours as needed for mild pain or headache.   Yes Historical Provider, MD  guaiFENesin (MUCINEX) 600 MG 12 hr tablet Take 1 tablet (600 mg total) by mouth 2 (two) times daily as needed. 07/23/16  Yes Farrel Connersolleen Gutierrez, CNM  sertraline (ZOLOFT) 50 MG tablet Take 1/2 tablet daily 07/23/16  Yes Farrel Connersolleen Gutierrez, CNM  azithromycin (ZITHROMAX Z-PAK) 250 MG tablet Take 2 tabs today then one tab daily for 5 days Patient not taking: Reported on 08/07/2016 07/23/16   Farrel Connersolleen Gutierrez, CNM  diphenhydrAMINE (BENADRYL) 25 MG tablet Take 25 mg by mouth every 4 (four) hours as needed for allergies.    Historical Provider, MD  fluticasone (FLONASE) 50 MCG/ACT nasal spray Place 2 sprays into both nostrils daily. Patient not taking: Reported on 08/07/2016 03/09/16   Charlesetta IvoryJenise V Bacon Menshew, PA-C   Prenatal Vit-Fe Fumarate-FA (PRENATAL MULTIVITAMIN) TABS tablet Take 1 tablet by mouth daily.     Historical Provider, MD    OB History  Gravida Para Term Preterm AB Living  4 1     2 1   SAB TAB Ectopic Multiple Live Births  1   1        # Outcome Date GA Lbr Len/2nd Weight Sex Delivery Anes PTL Lv  4 Current           3 Ectopic           2 SAB           1 Para               Prenatal care site: [facility]  Social History: She  reports that she has never smoked. She has never used smokeless tobacco. She reports that she does not drink alcohol or use drugs.  Family History: family history includes Diabetes in her maternal grandfather; Suicidality in her father.   Review of Systems: Negative x 10 systems reviewed except as noted in the HPI.    Physical Exam:  Vital Signs: BP (!) 100/40 (BP Location: Right Arm)   Pulse 89   Temp 99.1 F (37.3 C) (Oral)   Resp 18   Ht 5\' 3"  (1.6 m)   Wt 220 lb (99.8 kg)  LMP 11/04/2015   BMI 38.97 kg/m  Constitutional: Well nourished, well developed female in no acute distress.  HEENT: normal Skin: Warm and dry.  Cardiovascular: Regular rate and rhythm.   Extremity: none Respiratory: Clear to auscultation bilateral. Normal respiratory effort Abdomen: soft, gravid, nontender, +BS Back: no CVAT Neuro: DTRs 2+, Cranial nerves grossly intact Psych: Alert and Oriented x3. No memory deficits. Normal mood and affect.  MS: normal gait, normal bilateral lower extremity ROM/strength/stability.  Pelvic exam: deferred  Baseline FHR: 140 beats/min   Variability: moderate   Accelerations: present   Decelerations: absent Contractions: absent frequency: n/a Overall assessment: category 1  Assessment:  Paula Sullivan is a 31 y.o. G49P0021 female at [redacted]w[redacted]d with refractory nausea and vomiting.   Plan:  1. Admit to Labor & Delivery for observation 2. NPO for now. IVF resuscitation.  3. Prn IV anti-emetics.   4. Fetwal well-being: reassuring,  category 1 5. Dispo: home when tolerating PO   Conard Novak, MD 08/08/2016 1:53 AM

## 2016-08-08 NOTE — Discharge Instructions (Signed)
Viral Gastroenteritis, Adult Introduction Viral gastroenteritis is also known as the stomach flu. This condition is caused by certain germs (viruses). These germs can be passed from person to person very easily (are very contagious). This condition can cause sudden watery poop (diarrhea), fever, and throwing up (vomiting). Having watery poop and throwing up can make you feel weak and cause you to get dehydrated. Dehydration can make you tired and thirsty, make you have a dry mouth, and make it so you pee (urinate) less often. Older adults and people with other diseases or a weak defense system (immune system) are at higher risk for dehydration. It is important to replace the fluids that you lose from having watery poop and throwing up. Follow these instructions at home: Follow instructions from your doctor about how to care for yourself at home. Eating and drinking Follow these instructions as told by your doctor:  Take an oral rehydration solution (ORS). This is a drink that is sold at pharmacies and stores.  Drink clear fluids in small amounts as you are able, such as:  Water.  Ice chips.  Diluted fruit juice.  Low-calorie sports drinks.  Eat bland, easy-to-digest foods in small amounts as you are able, such as:  Bananas.  Applesauce.  Rice.  Low-fat (lean) meats.  Toast.  Crackers.  Avoid fluids that have a lot of sugar or caffeine in them.  Avoid alcohol.  Avoid spicy or fatty foods. General instructions  Drink enough fluid to keep your pee (urine) clear or pale yellow.  Wash your hands often. If you cannot use soap and water, use hand sanitizer.  Make sure that all people in your home wash their hands well and often.  Rest at home while you get better.  Take over-the-counter and prescription medicines only as told by your doctor.  Watch your condition for any changes.  Take a warm bath to help with any burning or pain from having watery poop.  Keep all  follow-up visits as told by your doctor. This is important. Contact a doctor if:  You cannot keep fluids down.  Your symptoms get worse.  You have new symptoms.  You feel light-headed or dizzy.  You have muscle cramps. Get help right away if:  You have chest pain.  You feel very weak or you pass out (faint).  You see blood in your throw-up.  Your throw-up looks like coffee grounds.  You have bloody or black poop (stools) or poop that look like tar.  You have a very bad headache, a stiff neck, or both.  You have a rash.  You have very bad pain, cramping, or bloating in your belly (abdomen).  You have trouble breathing.  You are breathing very quickly.  Your heart is beating very quickly.  Your skin feels cold and clammy.  You feel confused.  You have pain when you pee.  You have signs of dehydration, such as:  Dark pee, hardly any pee, or no pee.  Cracked lips.  Dry mouth.  Sunken eyes.  Sleepiness.  Weakness. This information is not intended to replace advice given to you by your health care provider. Make sure you discuss any questions you have with your health care provider. Document Released: 12/17/2007 Document Revised: 01/18/2016 Document Reviewed: 03/06/2015  2017 Elsevier  

## 2016-08-08 NOTE — Discharge Summary (Signed)
Physician Discharge Summary  Patient ID: Paula Sullivan MRN: [id number] DOB/AGE: [birth year] 31 y.o.  Admit date: 08/07/2016 Discharge date: 08/08/2016  Admission Diagnoses: Nausea, Vomiting, Diarrhea  Discharge Diagnoses: Viral Gastroenteritis  Discharged Condition: good  Hospital Course: She presents with refractory nausea with emesis.  She also notes some mild mid-abdominal cramping that is bilateral.  She started having nausea early this afternoon.  She has been unable to keep any food down at all.  She also has been having diarrhea. She notes no sick contacts.  She denies fevers, chills.  Denies blood in her stool and vomitus.  She has tried no home medication.  Pregnant with second child at 38 weeks, no S/SX labor.  Pt seen and examined, treated with IV Fluids, Zofran, and Imodium.  Improved in symptoms.  Fetal well being reassuring throughout.  Consults: None  Significant Diagnostic Studies: A NST procedure was performed with FHR monitoring and a normal baseline established, appropriate time of 20-40 minutes of evaluation, and accels >2 seen w 15x15 characteristics.  Results show a REACTIVE NST.   Treatments: IV hydration  Discharge Exam: Blood pressure 107/63, pulse 98, temperature 98.1 F (36.7 C), temperature source Oral, resp. rate 20, height 5\' 3"  (1.6 m), weight 220 lb (99.8 kg), last menstrual period 11/04/2015. General appearance: alert, cooperative and no distress GI: soft, non-tender; bowel sounds normal; no masses,  no organomegaly Neurologic: Grossly normal  Disposition: 01-Home or Self Care  Discharge Instructions    Call MD for:  persistant nausea and vomiting    Complete by:  As directed    Call MD for:  temperature >100.4    Complete by:  As directed      Allergies as of 08/08/2016      Reactions   Latex Rash   Unknown childhood reaction.       Medication List    STOP taking these medications   diphenhydrAMINE 25 MG tablet Commonly known as:   BENADRYL     TAKE these medications   acetaminophen 500 MG tablet Commonly known as:  TYLENOL Take 1,000 mg by mouth every 6 (six) hours as needed for mild pain or headache.   azithromycin 250 MG tablet Commonly known as:  ZITHROMAX Z-PAK Take 2 tabs today then one tab daily for 5 days   fluticasone 50 MCG/ACT nasal spray Commonly known as:  FLONASE Place 2 sprays into both nostrils daily.   guaiFENesin 600 MG 12 hr tablet Commonly known as:  MUCINEX Take 1 tablet (600 mg total) by mouth 2 (two) times daily as needed.   prenatal multivitamin Tabs tablet Take 1 tablet by mouth daily.   sertraline 50 MG tablet Commonly known as:  ZOLOFT Take 1/2 tablet daily     ALSO, can take IMODIUM for diarrhea   Signed: Letitia Libraobert Paul Harris 08/08/2016, 9:10 AM

## 2016-08-14 ENCOUNTER — Inpatient Hospital Stay: Payer: Medicaid Other | Admitting: Anesthesiology

## 2016-08-14 ENCOUNTER — Inpatient Hospital Stay
Admission: EM | Admit: 2016-08-14 | Discharge: 2016-08-16 | DRG: 775 | Disposition: A | Discharge status: Home / Self Care | Payer: Medicaid Other | Source: Ambulatory Visit | Attending: Obstetrics & Gynecology | Admitting: Obstetrics & Gynecology

## 2016-08-14 ENCOUNTER — Emergency Department
Admission: EM | Admit: 2016-08-14 | Discharge: 2016-08-14 | Discharge status: Absent without leave | Payer: Medicaid Other

## 2016-08-14 DIAGNOSIS — O26893 Other specified pregnancy related conditions, third trimester: Principal | ICD-10-CM | POA: Diagnosis present

## 2016-08-14 DIAGNOSIS — Z91040 Latex allergy status: Secondary | ICD-10-CM | POA: Diagnosis not present

## 2016-08-14 DIAGNOSIS — Z3A39 39 weeks gestation of pregnancy: Secondary | ICD-10-CM

## 2016-08-14 DIAGNOSIS — Z6791 Unspecified blood type, Rh negative: Secondary | ICD-10-CM

## 2016-08-14 DIAGNOSIS — Z3493 Encounter for supervision of normal pregnancy, unspecified, third trimester: Secondary | ICD-10-CM | POA: Diagnosis present

## 2016-08-14 LAB — CBC
HEMATOCRIT: 34.2 % — AB (ref 35.0–47.0)
HEMOGLOBIN: 12 g/dL (ref 12.0–16.0)
MCH: 32.3 pg (ref 26.0–34.0)
MCHC: 35.2 g/dL (ref 32.0–36.0)
MCV: 91.6 fL (ref 80.0–100.0)
Platelets: 146 10*3/uL — ABNORMAL LOW (ref 150–440)
RBC: 3.73 MIL/uL — ABNORMAL LOW (ref 3.80–5.20)
RDW: 14 % (ref 11.5–14.5)
WBC: 7 10*3/uL (ref 3.6–11.0)

## 2016-08-14 LAB — ABO/RH: ABO/RH(D): AB NEG

## 2016-08-14 MED ORDER — OXYTOCIN 40 UNITS IN LACTATED RINGERS INFUSION - SIMPLE MED: 2.5000 [IU]/h | INTRAVENOUS | Status: DC

## 2016-08-14 MED ORDER — NALBUPHINE HCL 10 MG/ML IJ SOLN: 5.0000 mg | Freq: Once | INTRAMUSCULAR | Status: DC | PRN

## 2016-08-14 MED ORDER — TERBUTALINE SULFATE 1 MG/ML IJ SOLN: 0.2500 mg | Freq: Once | INTRAMUSCULAR | Status: DC | PRN

## 2016-08-14 MED ORDER — SODIUM CHLORIDE 0.9 % IV SOLN
INTRAVENOUS | Status: DC | PRN
  Administered 2016-08-14 (×2): 5 mL via EPIDURAL

## 2016-08-14 MED ORDER — SOD CITRATE-CITRIC ACID 500-334 MG/5ML PO SOLN
30.0000 mL | ORAL | Status: DC | PRN
  Administered 2016-08-14: 30 mL via ORAL
  Filled 2016-08-14: qty 15

## 2016-08-14 MED ORDER — OXYTOCIN 40 UNITS IN LACTATED RINGERS INFUSION - SIMPLE MED
1.0000 m[IU]/min | INTRAVENOUS | Status: DC
  Administered 2016-08-14: 1 m[IU]/min via INTRAVENOUS

## 2016-08-14 MED ORDER — FENTANYL 2.5 MCG/ML W/ROPIVACAINE 0.2% IN NS 100 ML EPIDURAL INFUSION (ARMC-ANES)
EPIDURAL | Status: DC | PRN
  Administered 2016-08-14: 10 mL/h via EPIDURAL

## 2016-08-14 MED ORDER — OXYTOCIN BOLUS FROM INFUSION: 500.0000 mL | Freq: Once | INTRAVENOUS | Status: DC

## 2016-08-14 MED ORDER — MISOPROSTOL 25 MCG QUARTER TABLET
25.0000 ug | ORAL_TABLET | ORAL | Status: DC | PRN
Start: 2016-08-14 — End: 2016-08-15
  Administered 2016-08-14 (×2): 25 ug via VAGINAL
  Filled 2016-08-14 (×2): qty 0.25

## 2016-08-14 MED ORDER — DIPHENHYDRAMINE HCL 25 MG PO CAPS: 25.0000 mg | ORAL_CAPSULE | ORAL | Status: DC | PRN

## 2016-08-14 MED ORDER — NALOXONE HCL 2 MG/2ML IJ SOSY
1.0000 ug/kg/h | PREFILLED_SYRINGE | INTRAMUSCULAR | Status: DC | PRN
  Filled 2016-08-14: qty 2

## 2016-08-14 MED ORDER — FENTANYL 2.5 MCG/ML W/ROPIVACAINE 0.2% IN NS 100 ML EPIDURAL INFUSION (ARMC-ANES): 10.0000 mL/h | EPIDURAL | Status: DC

## 2016-08-14 MED ORDER — LIDOCAINE HCL (PF) 1 % IJ SOLN
INTRAMUSCULAR | Status: DC | PRN
  Administered 2016-08-14: 1 mL via INTRADERMAL

## 2016-08-14 MED ORDER — NALBUPHINE HCL 10 MG/ML IJ SOLN: 5.0000 mg | INTRAMUSCULAR | Status: DC | PRN

## 2016-08-14 MED ORDER — OXYTOCIN 40 UNITS IN LACTATED RINGERS INFUSION - SIMPLE MED
1.0000 m[IU]/min | INTRAVENOUS | Status: DC
Start: 2016-08-14 — End: 2016-08-14

## 2016-08-14 MED ORDER — OXYTOCIN 10 UNIT/ML IJ SOLN: 10.0000 [IU] | Freq: Once | INTRAMUSCULAR | Status: DC

## 2016-08-14 MED ORDER — ONDANSETRON HCL 4 MG/2ML IJ SOLN
4.0000 mg | Freq: Four times a day (QID) | INTRAMUSCULAR | Status: DC | PRN
  Administered 2016-08-15: 4 mg via INTRAVENOUS
  Filled 2016-08-14: qty 2

## 2016-08-14 MED ORDER — LIDOCAINE 2% (20 MG/ML) 5 ML SYRINGE
INTRAMUSCULAR | Status: AC
  Filled 2016-08-14: qty 20

## 2016-08-14 MED ORDER — DIPHENHYDRAMINE HCL 50 MG/ML IJ SOLN
12.5000 mg | INTRAMUSCULAR | Status: DC | PRN
  Administered 2016-08-14: 12.5 mg via INTRAVENOUS

## 2016-08-14 MED ORDER — SODIUM CHLORIDE 0.9% FLUSH: 3.0000 mL | INTRAVENOUS | Status: DC | PRN

## 2016-08-14 MED ORDER — LIDOCAINE HCL (PF) 1 % IJ SOLN: 30.0000 mL | INTRAMUSCULAR | Status: DC | PRN

## 2016-08-14 MED ORDER — NALOXONE HCL 0.4 MG/ML IJ SOLN
0.4000 mg | INTRAMUSCULAR | Status: DC | PRN
Start: 2016-08-14 — End: 2016-08-15

## 2016-08-14 MED ORDER — LACTATED RINGERS IV SOLN: 500.0000 mL | INTRAVENOUS | Status: DC | PRN

## 2016-08-14 MED ORDER — ROPIVACAINE HCL 2 MG/ML IJ SOLN
10.0000 mL/h | INTRAMUSCULAR | Status: DC
  Administered 2016-08-14: 10 mL/h via EPIDURAL
  Filled 2016-08-14 (×5): qty 5

## 2016-08-14 MED ORDER — OXYTOCIN 40 UNITS IN LACTATED RINGERS INFUSION - SIMPLE MED
INTRAVENOUS | Status: AC
  Administered 2016-08-14: 1 m[IU]/min via INTRAVENOUS
  Filled 2016-08-14: qty 1000

## 2016-08-14 MED ORDER — LIDOCAINE-EPINEPHRINE (PF) 1.5 %-1:200000 IJ SOLN
INTRAMUSCULAR | Status: DC | PRN
  Administered 2016-08-14: 3 mL via EPIDURAL

## 2016-08-14 MED ORDER — LACTATED RINGERS IV SOLN
INTRAVENOUS | Status: DC
  Administered 2016-08-14 (×2): via INTRAVENOUS

## 2016-08-14 NOTE — Anesthesia Procedure Notes (Signed)
Epidural Patient location during procedure: OB Start time: 08/14/2016 6:27 PM End time: 08/14/2016 6:29 PM  Staffing Anesthesiologist: Margorie JohnPISCITELLO, JOSEPH K Performed: anesthesiologist   Preanesthetic Checklist Completed: patient identified, site marked, surgical consent, pre-op evaluation, timeout performed, IV checked, risks and benefits discussed and monitors and equipment checked  Epidural Patient position: sitting Prep: Betadine Patient monitoring: heart rate, continuous pulse ox and blood pressure Approach: midline Location: L4-L5 Injection technique: LOR saline  Needle:  Needle type: Tuohy  Needle gauge: 17 G Needle length: 9 cm and 9 Needle insertion depth: 6.5 cm Catheter type: closed end flexible Catheter size: 19 Gauge Catheter at skin depth: 10.5 cm Test dose: negative and 1.5% lidocaine with Epi 1:200 K  Assessment Sensory level: T10 Events: blood not aspirated, injection not painful, no injection resistance, negative IV test and no paresthesia  Additional Notes Pt. Evaluated and documentation done after procedure finished. Patient identified. Risks/Benefits/Options discussed with patient including but not limited to bleeding, infection, nerve damage, paralysis, failed block, incomplete pain control, headache, blood pressure changes, nausea, vomiting, reactions to medication both or allergic, itching and postpartum back pain. Confirmed with bedside nurse the patient's most recent platelet count. Confirmed with patient that they are not currently taking any anticoagulation, have any bleeding history or any family history of bleeding disorders. Patient expressed understanding and wished to proceed. All questions were answered. Sterile technique was used throughout the entire procedure. Please see nursing notes for vital signs. Test dose was given through epidural catheter and negative prior to continuing to dose epidural or start infusion. Warning signs of high block given  to the patient including shortness of breath, tingling/numbness in hands, complete motor block, or any concerning symptoms with instructions to call for help. Patient was given instructions on fall risk and not to get out of bed. All questions and concerns addressed with instructions to call with any issues or inadequate analgesia.   Patient tolerated the insertion well without immediate complications.Reason for block:procedure for pain

## 2016-08-14 NOTE — H&P (Signed)
Obstetrics Admission History & Physical   IOL   HPI:  31 y.o. [id number] @ 4419w1d (08/20/2016, by Ultrasound). Admitted on 08/14/2016:   Patient Active Problem List   Diagnosis Date Noted  . Labor and delivery, indication for care 08/14/2016  . Headache 07/23/2016  . Back pain affecting pregnancy in second trimester 07/23/2016  . Lower abdominal pain 07/13/2016  . Abdominal pain affecting pregnancy 06/05/2016     Presents for labor induction at 39 weeks, prior NSVD 2005 (adopted out).   [facility]. Pregnancy complicated by none.  ROS: A review of systems was performed and negative, except as stated in the above HPI. PMHx:  Past Medical History:  Diagnosis Date  . Type AB blood, Rh negative    PSHx:  Past Surgical History:  Procedure Laterality Date  . CHOLECYSTECTOMY    . GALLBLADDER SURGERY  7-06   Medications:  Prescriptions Prior to Admission  Medication Sig Dispense Refill Last Dose  . acetaminophen (TYLENOL) 500 MG tablet Take 1,000 mg by mouth every 6 (six) hours as needed for mild pain or headache.   08/14/2016 at Unknown time  . sertraline (ZOLOFT) 50 MG tablet Take 1/2 tablet daily 30 tablet 3 08/13/2016 at Unknown time  . azithromycin (ZITHROMAX Z-PAK) 250 MG tablet Take 2 tabs today then one tab daily for 5 days (Patient not taking: Reported on 08/07/2016) 6 each 0 Not Taking at Unknown time  . fluticasone (FLONASE) 50 MCG/ACT nasal spray Place 2 sprays into both nostrils daily. (Patient not taking: Reported on 08/07/2016) 16 g 0 Not Taking at Unknown time  . guaiFENesin (MUCINEX) 600 MG 12 hr tablet Take 1 tablet (600 mg total) by mouth 2 (two) times daily as needed. (Patient not taking: Reported on 08/14/2016) 20 tablet 0 Not Taking at Unknown time  . Prenatal Vit-Fe Fumarate-FA (PRENATAL MULTIVITAMIN) TABS tablet Take 1 tablet by mouth daily.    Not Taking at Unknown time   Allergies: is allergic to latex. OBHx:  OB History  Gravida Para Term Preterm AB  Living  4 1     2 1   SAB TAB Ectopic Multiple Live Births  1   1        # Outcome Date GA Lbr Len/2nd Weight Sex Delivery Anes PTL Lv  4 Current           3 Ectopic           2 SAB           1 Para              AVW:UJWJXBJY/[id number]:Negative/unremarkable except as detailed in HPI.Marland Kitchen.  No family history of birth defects. Soc Hx: Never smoker, Alcohol: none and Recreational drug use: none  Objective:   Vitals:   08/14/16 0749  BP: 114/75  Pulse: 80  Resp: 16  Temp: 98 F (36.7 C)   Constitutional: Well nourished, well developed female in no acute distress.  HEENT: normal Skin: Warm and dry.  Cardiovascular:Regular rate and rhythm.   Extremity: trace to 1+ bilateral pedal edema Respiratory: Clear to auscultation bilateral. Normal respiratory effort Abdomen: mild Back: no CVAT Neuro: DTRs 2+, Cranial nerves grossly intact Psych: Alert and Oriented x3. No memory deficits. Normal mood and affect.  MS: normal gait, normal bilateral lower extremity ROM/strength/stability.  Pelvic exam: is not limited by body habitus EGBUS: within normal limits Vagina: within normal limits and with normal mucosa blood in the vault Cervix: 1-2/50/-3, VTX Uterus: Uterus demonstrates  irritability pattern.  Adnexa: not evaluated  EFM:FHR: 140 bpm, variability: moderate,  accelerations:  Present,  decelerations:  Absent Toco: None   Perinatal info:  Blood type: AB negative Rubella- Immune Varicella -Not immune TDaP Given during third trimester of this pregnancy RPR NR / HIV Neg/ HBsAg Neg / GBS NEG  Assessment & Plan:   31 y.o. [id number] @ [redacted]w[redacted]d, Admitted on 08/14/2016: IOL at 39+ weeks EGA.    Admit for labor, Observe for cervical change, Fetal Wellbeing Reassuring, Epidural when ready and AROM when Appropriate  Cytotec to help ripen the cervix more prior to Pitocin or AROM

## 2016-08-14 NOTE — Anesthesia Preprocedure Evaluation (Signed)
Anesthesia Evaluation  Patient identified by MRN, date of birth, ID band Patient awake    Reviewed: Allergy & Precautions, H&P , NPO status , Patient's Chart, lab work & pertinent test results  Airway Mallampati: III  TM Distance: >3 FB Neck ROM: full    Dental  (+) Poor Dentition   Pulmonary neg pulmonary ROS, neg shortness of breath,    Pulmonary exam normal breath sounds clear to auscultation       Cardiovascular Exercise Tolerance: Good (-) hypertensionnegative cardio ROS Normal cardiovascular exam Rhythm:regular Rate:Normal     Neuro/Psych  Headaches,    GI/Hepatic negative GI ROS,   Endo/Other    Renal/GU   negative genitourinary   Musculoskeletal   Abdominal   Peds  Hematology negative hematology ROS (+)   Anesthesia Other Findings Patient has baseline back pain   Past Medical History: No date: Type AB blood, Rh negative  Past Surgical History: No date: CHOLECYSTECTOMY 7-06: GALLBLADDER SURGERY  BMI    Body Mass Index:  38.97 kg/m      Reproductive/Obstetrics (+) Pregnancy                             Anesthesia Physical Anesthesia Plan  ASA: II  Anesthesia Plan: Epidural   Post-op Pain Management:    Induction:   Airway Management Planned:   Additional Equipment:   Intra-op Plan:   Post-operative Plan:   Informed Consent: I have reviewed the patients History and Physical, chart, labs and discussed the procedure including the risks, benefits and alternatives for the proposed anesthesia with the patient or authorized representative who has indicated his/her understanding and acceptance.     Plan Discussed with: Anesthesiologist  Anesthesia Plan Comments:         Anesthesia Quick Evaluation

## 2016-08-14 NOTE — Progress Notes (Signed)
  Labor Progress Note   31 y.o. [id number] @ 3379w1d , admitted for  Pregnancy, Labor Management.   Subjective:  Painful ctxs Every 2-3 min (Cytotec used so far in her IOL)  Objective:  BP 124/80 (BP Location: Left Arm)   Pulse 85   Temp 98.1 F (36.7 C) (Oral)   Resp 16   Ht 5\' 3"  (1.6 m)   Wt 220 lb (99.8 kg)   LMP 11/04/2015   BMI 38.97 kg/m  Abd: moderate Extr: trace to 1+ bilateral pedal edema SVE: CERVIX: 3-4 cm dilated, 70  effaced, -2 station  EFM: FHR: 140 bpm, variability: moderate,  accelerations:  Present,  decelerations:  Absent Toco: Frequency: Every 2 minutes Labs: I have reviewed the patient's lab results.   Assessment & Plan:  [id number] @ 8179w1d, admitted for  Pregnancy and Labor/Delivery Management  1. Pain management: none. 2. FWB: FHT category 1.  3. ID: GBS negative 4. Labor management: Epidural then Pitosin, AROM when appropriate  All discussed with patient, see orders

## 2016-08-15 LAB — RPR: RPR: NONREACTIVE

## 2016-08-15 MED ORDER — ACETAMINOPHEN 325 MG PO TABS: 650.0000 mg | ORAL_TABLET | ORAL | Status: DC | PRN

## 2016-08-15 MED ORDER — SODIUM CHLORIDE 0.9% FLUSH: 3.0000 mL | Freq: Two times a day (BID) | INTRAVENOUS | Status: DC

## 2016-08-15 MED ORDER — VARICELLA VIRUS VACCINE LIVE 1350 PFU/0.5ML IJ SUSR
0.5000 mL | Freq: Once | INTRAMUSCULAR | Status: AC
  Administered 2016-08-16: 0.5 mL via SUBCUTANEOUS
  Filled 2016-08-15 (×2): qty 0.5

## 2016-08-15 MED ORDER — SODIUM CHLORIDE 0.9% FLUSH: 3.0000 mL | INTRAVENOUS | Status: DC | PRN

## 2016-08-15 MED ORDER — DIPHENHYDRAMINE HCL 25 MG PO CAPS: 25.0000 mg | ORAL_CAPSULE | Freq: Four times a day (QID) | ORAL | Status: DC | PRN

## 2016-08-15 MED ORDER — BENZOCAINE-MENTHOL 20-0.5 % EX AERO
1.0000 "application " | INHALATION_SPRAY | CUTANEOUS | Status: DC | PRN
  Administered 2016-08-15 – 2016-08-16 (×2): 1 via TOPICAL
  Filled 2016-08-15 (×2): qty 56

## 2016-08-15 MED ORDER — IBUPROFEN 600 MG PO TABS
600.0000 mg | ORAL_TABLET | Freq: Four times a day (QID) | ORAL | Status: DC
  Administered 2016-08-15 – 2016-08-16 (×5): 600 mg via ORAL
  Filled 2016-08-15 (×5): qty 1

## 2016-08-15 MED ORDER — COCONUT OIL OIL: 1.0000 "application " | TOPICAL_OIL | Status: DC | PRN

## 2016-08-15 MED ORDER — OXYCODONE-ACETAMINOPHEN 5-325 MG PO TABS
1.0000 | ORAL_TABLET | ORAL | Status: DC | PRN
  Administered 2016-08-15 – 2016-08-16 (×7): 1 via ORAL
  Filled 2016-08-15 (×7): qty 1

## 2016-08-15 MED ORDER — OXYCODONE-ACETAMINOPHEN 5-325 MG PO TABS: 2.0000 | ORAL_TABLET | ORAL | Status: DC | PRN

## 2016-08-15 MED ORDER — ONDANSETRON HCL 4 MG PO TABS: 4.0000 mg | ORAL_TABLET | ORAL | Status: DC | PRN

## 2016-08-15 MED ORDER — DIBUCAINE 1 % RE OINT: 1.0000 "application " | TOPICAL_OINTMENT | RECTAL | Status: DC | PRN

## 2016-08-15 MED ORDER — ONDANSETRON HCL 4 MG/2ML IJ SOLN
4.0000 mg | INTRAMUSCULAR | Status: DC | PRN
Start: 2016-08-15 — End: 2016-08-17

## 2016-08-15 MED ORDER — SENNOSIDES-DOCUSATE SODIUM 8.6-50 MG PO TABS
2.0000 | ORAL_TABLET | ORAL | Status: DC
  Administered 2016-08-15 – 2016-08-16 (×2): 2 via ORAL
  Filled 2016-08-15: qty 2

## 2016-08-15 MED ORDER — SODIUM CHLORIDE 0.9 % IV SOLN
250.0000 mL | INTRAVENOUS | Status: DC | PRN
Start: 2016-08-15 — End: 2016-08-15

## 2016-08-15 MED ORDER — WITCH HAZEL-GLYCERIN EX PADS
1.0000 | MEDICATED_PAD | CUTANEOUS | Status: DC | PRN
Start: 2016-08-15 — End: 2016-08-17

## 2016-08-15 MED ORDER — ZOLPIDEM TARTRATE 5 MG PO TABS: 5.0000 mg | ORAL_TABLET | Freq: Every evening | ORAL | Status: DC | PRN

## 2016-08-15 MED ORDER — SIMETHICONE 80 MG PO CHEW
80.0000 mg | CHEWABLE_TABLET | ORAL | Status: DC | PRN
Start: 2016-08-15 — End: 2016-08-17
  Administered 2016-08-15: 80 mg via ORAL
  Filled 2016-08-15: qty 1

## 2016-08-15 NOTE — Discharge Summary (Signed)
Obstetrical Discharge Summary  Date of Admission: 08/14/2016 Date of Discharge: 08/17/16  Discharge Diagnosis: Term Pregnancy-delivered Primary OB:  [facility]   Gestational Age at Delivery: 6338w2d  Antepartum complications: none Date of Delivery: 08/19/2016  Delivered By: Annamarie MajorPaul Harris, MD Delivery Type: spontaneous vaginal delivery Intrapartum complications/course: None Anesthesia: epidural Placenta: spontaneous Laceration: none Episiotomy: none Live born female   APGAR: 9,9    Post partum course: Since the delivery, patient has tolerate activity, diet, and daily functions without difficulty or complication.  Min lochia.  No breast concerns at this time.  No signs of depression currently.   Postpartum Exam:General appearance: alert and cooperative GI: Fundus firm Extremities: extremities normal, atraumatic, no cyanosis or edema  Disposition: home with infant Rh Immune globulin given: yes Rubella vaccine given: no Varicella vaccine given: yes Tdap vaccine given in AP or PP setting: given during prenatal care Flu vaccine given in AP or PP setting: given during prenatal care Contraception: oral progesterone-only contraceptive  Prenatal Labs: AB NEG//Rubella Immune//RPR negative//HIV negative/HepB Surface Ag negative//plans to breastfeed  Plan:  Paula MalletMica M Whiteaker was discharged to home in good condition. [facility]-up appointment with [facility] provider in 6 weeks  Discharge Medications: Allergies as of 08/16/2016      Reactions   Latex Rash   Unknown childhood reaction.       Medication List    STOP taking these medications   azithromycin 250 MG tablet Commonly known as:  ZITHROMAX Z-PAK   fluticasone 50 MCG/ACT nasal spray Commonly known as:  FLONASE   guaiFENesin 600 MG 12 hr tablet Commonly known as:  MUCINEX     TAKE these medications   acetaminophen 500 MG tablet Commonly known as:  TYLENOL Take 1,000 mg by mouth every 6 (six) hours as needed for mild pain or headache.    ibuprofen 600 MG tablet Commonly known as:  ADVIL,MOTRIN Take 1 tablet (600 mg total) by mouth every 6 (six) hours.   prenatal multivitamin Tabs tablet Take 1 tablet by mouth daily.   sertraline 50 MG tablet Commonly known as:  ZOLOFT Take 1/2 tablet daily       Follow-up arrangements:  6 weeks

## 2016-08-15 NOTE — Discharge Instructions (Signed)

## 2016-08-16 LAB — CBC
HCT: 28.5 % — ABNORMAL LOW (ref 35.0–47.0)
HEMOGLOBIN: 10.2 g/dL — AB (ref 12.0–16.0)
MCH: 33 pg (ref 26.0–34.0)
MCHC: 35.6 g/dL (ref 32.0–36.0)
MCV: 92.6 fL (ref 80.0–100.0)
Platelets: 114 10*3/uL — ABNORMAL LOW (ref 150–440)
RBC: 3.08 MIL/uL — ABNORMAL LOW (ref 3.80–5.20)
RDW: 14.1 % (ref 11.5–14.5)
WBC: 9.8 10*3/uL (ref 3.6–11.0)

## 2016-08-16 LAB — FETAL SCREEN: Fetal Screen: NEGATIVE

## 2016-08-16 MED ORDER — RHO D IMMUNE GLOBULIN 1500 UNIT/2ML IJ SOSY
300.0000 ug | PREFILLED_SYRINGE | Freq: Once | INTRAMUSCULAR | Status: AC
  Administered 2016-08-16: 300 ug via INTRAMUSCULAR
  Filled 2016-08-16: qty 2

## 2016-08-16 MED ORDER — IBUPROFEN 600 MG PO TABS: 600.0000 mg | ORAL_TABLET | Freq: Four times a day (QID) | ORAL | 1 refills | Status: DC

## 2016-08-16 MED ORDER — IBUPROFEN 600 MG PO TABS: 600.0000 mg | ORAL_TABLET | Freq: Four times a day (QID) | ORAL | 0 refills | Status: DC

## 2016-08-16 NOTE — Anesthesia Postprocedure Evaluation (Signed)
Anesthesia Post Note  Patient: Paula MalletMica M Sullivan  Procedure(s) Performed: * No procedures listed *  Patient location during evaluation: Mother Baby Anesthesia Type: Epidural Level of consciousness: awake and alert Pain management: pain level controlled Vital Signs Assessment: post-procedure vital signs reviewed and stable Respiratory status: spontaneous breathing, nonlabored ventilation and respiratory function stable Cardiovascular status: stable Postop Assessment: no headache, no backache and patient able to bend at knees Anesthetic complications: no Comments: Patient had very normal epidural placement, one attempt, no paresthesias with needle advancement or catheter placement. She now endorses some minor numbness and weakness in the medial aspect of her left thigh.  She reports that she is able to ambulate without problems. She has full strength in her right leg, she has full strength at knee flexion and extension in left leg, full strength with left leg abduction and she has mildly decreased strength in left leg adduction. I explained to the patient that this decreased strength is probably due to some minor stretch or compression injury on her obturator nerve on the left side either from positioning during her labor or from passage of the baby through her pelvis past the nerve. That at this time I do not believe that these nerve deficits are from the epidural. That these injuries oftentimes spontaneously resolve. However, if she feels like the injury is not getting better or if it is getting worse, that she should contact a physician about this. Patient endorsed understanding and all her questions were answered.     Last Vitals:  Vitals:   08/16/16 0042 08/16/16 0312  BP: 112/72 119/71  Pulse: 84 88  Resp: 20 20  Temp: 36.7 C 36.5 C    Last Pain:  Vitals:   08/16/16 0800  TempSrc:   PainSc: 3                  Cleda MccreedyJoseph K Piscitello

## 2016-08-16 NOTE — Progress Notes (Signed)
D/C home to car via auxiliary in wheelchair.  

## 2016-08-16 NOTE — Progress Notes (Signed)
D/C instructions provided, pt states understanding, aware of follow up appt.  Prescription given to pt.   

## 2016-08-17 LAB — RHOGAM INJECTION: Unit division: 0

## 2016-08-17 NOTE — Discharge Summary (Signed)
    Obstetrical Discharge Summary  Date of Admission: 08/14/2016 Date of Discharge: 08/16/2016 Discharge Diagnosis: Term Pregnancy-delivered Primary OB:  [facility]   Gestational Age at Delivery: 4567w2d  Antepartum complications: none Date of Delivery: 08/15/2016  Delivered By: Annamarie MajorPaul Harris, MD Delivery Type: spontaneous vaginal delivery Intrapartum complications/course: None Anesthesia: epidural Placenta: spontaneous Laceration: none Episiotomy: none Live born female  Birth Weight:   APGAR: ,    Post partum course: Since the delivery, patient has tolerate activity, diet, and daily functions without difficulty or complication.  Min lochia.  No breast concerns at this time.  No signs of depression currently.   Postpartum Exam:General appearance: alert, cooperative and no distress GI: soft, non-tender; bowel sounds normal; no masses,  no organomegaly and Fundus firm Extremities: extremities normal, atraumatic, no cyanosis or edema  Disposition: home with infant Rh Immune globulin given: yes Rubella vaccine given: no Varicella vaccine given: yes Tdap vaccine given in AP or PP setting: given during prenatal care Flu vaccine given in AP or PP setting: given during prenatal care Contraception: oral progesterone-only contraceptive  Prenatal Labs: AB NEG//Rubella Immune//RPR negative//HIV negative/HepB Surface Ag negative//plans to breastfeed  Plan:  Paula MalletMica M Sullivan was discharged to home in good condition. [facility] in 6 weeks  Discharge Medications:     Allergies as of 08/15/2016      Reactions   Latex Rash   Unknown childhood reaction.      Follow-up arrangements:  Follow up with Dr. Annamarie MajorPaul Harris in 6 weeks       Thomasene MohairStephen Edwyn Inclan, MD 08/17/2016 6:24 PM

## 2016-08-18 LAB — TYPE AND SCREEN
ABO/RH(D): AB NEG
ANTIBODY SCREEN: POSITIVE
UNIT DIVISION: 0
Unit division: 0

## 2016-10-03 ENCOUNTER — Encounter: Payer: Self-pay | Admitting: Obstetrics & Gynecology

## 2016-11-04 ENCOUNTER — Ambulatory Visit: Payer: Self-pay | Admitting: Obstetrics & Gynecology

## 2016-11-10 ENCOUNTER — Encounter: Payer: Self-pay | Admitting: Obstetrics & Gynecology

## 2016-11-10 ENCOUNTER — Ambulatory Visit (INDEPENDENT_AMBULATORY_CARE_PROVIDER_SITE_OTHER): Payer: Medicaid Other | Admitting: Obstetrics & Gynecology

## 2016-11-10 VITALS — BP 120/80 | HR 87 | Ht 63.0 in | Wt 222.0 lb

## 2016-11-10 DIAGNOSIS — Z975 Presence of (intrauterine) contraceptive device: Secondary | ICD-10-CM

## 2016-11-10 NOTE — Progress Notes (Signed)
  History of Present Illness:  Paula Sullivan is a 31 y.o. that had a Paragard IUD placed approximately 1 month ago. Since that time, she states that she has had no bleeding, discharge and pain  PMHx: She  has a past medical history of Type AB blood, Rh negative. Also,  has a past surgical history that includes Gallbladder surgery (7-06) and Cholecystectomy., family history includes Diabetes in her maternal grandfather; Suicidality in her father.,  reports that she has never smoked. She has never used smokeless tobacco. She reports that she does not drink alcohol or use drugs.  She has a current medication list which includes the following prescription(s): ibuprofen, acetaminophen, prenatal multivitamin, and sertraline. Also, is allergic to latex.  Review of Systems  Constitutional: Negative for chills, fever and malaise/fatigue.  HENT: Negative for congestion, sinus pain and sore throat.   Eyes: Negative for blurred vision and pain.  Respiratory: Negative for cough and wheezing.   Cardiovascular: Negative for chest pain and leg swelling.  Gastrointestinal: Negative for abdominal pain, constipation, diarrhea, heartburn, nausea and vomiting.  Genitourinary: Negative for dysuria, frequency, hematuria and urgency.  Musculoskeletal: Negative for back pain, joint pain, myalgias and neck pain.  Skin: Negative for itching and rash.  Neurological: Negative for dizziness, tremors and weakness.  Endo/Heme/Allergies: Does not bruise/bleed easily.  Psychiatric/Behavioral: Negative for depression. The patient is not nervous/anxious and does not have insomnia.     Physical Exam:  BP 120/80   Pulse 87   Ht  (1.6 m)   Wt 222 lb (100.7 kg)   BMI 39.33 kg/m  Body mass index is 39.33 kg/m. Constitutional: Well nourished, well developed female in no acute distress.  Abdomen: diffusely non tender to palpation, non distended, and no masses, hernias Neuro: Grossly intact Psych:  Normal mood and affect.     Pelvic exam:  Two IUD strings present seen coming from the cervical os. EGBUS, vaginal vault and cervix: within normal limits  Assessment: IUD strings present in proper location; pt doing well  Plan: She was told to continue to use barrier contraception, in order to prevent any STIs, and to take a home pregnancy test or call us if she ever thinks she may be pregnant, and that her IUD expires in 10 years.  She was amenable to this plan and we will see her back in summer for Annual (reassess bleeding at that time from periods).  Annamarie Major, MD, [facility], [facility] 11/10/2016  9:50 AM

## 2017-01-12 ENCOUNTER — Encounter: Payer: Self-pay | Admitting: Obstetrics & Gynecology

## 2017-01-12 ENCOUNTER — Ambulatory Visit (INDEPENDENT_AMBULATORY_CARE_PROVIDER_SITE_OTHER): Payer: Medicaid Other | Admitting: Obstetrics & Gynecology

## 2017-01-12 VITALS — BP 110/70 | HR 85 | Ht 63.0 in | Wt 230.0 lb

## 2017-01-12 DIAGNOSIS — Z01419 Encounter for gynecological examination (general) (routine) without abnormal findings: Secondary | ICD-10-CM

## 2017-01-12 DIAGNOSIS — R1030 Lower abdominal pain, unspecified: Secondary | ICD-10-CM

## 2017-01-12 DIAGNOSIS — Z0000 Encounter for general adult medical examination without abnormal findings: Secondary | ICD-10-CM

## 2017-01-12 DIAGNOSIS — Z202 Contact with and (suspected) exposure to infections with a predominantly sexual mode of transmission: Secondary | ICD-10-CM

## 2017-01-12 DIAGNOSIS — Z30431 Encounter for routine checking of intrauterine contraceptive device: Secondary | ICD-10-CM

## 2017-01-12 DIAGNOSIS — Z124 Encounter for screening for malignant neoplasm of cervix: Secondary | ICD-10-CM

## 2017-01-12 MED ORDER — NORETHIN-ETH ESTRAD-FE BIPHAS 1 MG-10 MCG / 10 MCG PO TABS: 1.0000 | ORAL_TABLET | Freq: Every day | ORAL | 11 refills | Status: DC

## 2017-01-12 NOTE — Patient Instructions (Addendum)
PAP every three years Labs yearly (with PCP)  Ethinyl Estradiol; Norethindrone Acetate; Ferrous fumarate tablets or capsules What is this medicine? ETHINYL ESTRADIOL; NORETHINDRONE ACETATE; FERROUS FUMARATE (ETH in il es tra DYE ole; nor eth IN drone AS e tate; FER [place] FUE ma rate) is an oral contraceptive. The products combine two types of female hormones, an estrogen and a progestin. They are used to prevent ovulation and pregnancy. Some products are also used to treat acne in females. This medicine may be used for other purposes; ask your health care provider or pharmacist if you have questions. COMMON BRAND NAME(S): Blisovi [address] Blisovi Fe, Estrostep Fe, Gildess [address], Gildess Fe 1.5/30, Gildess Fe 1/20, Junel Fe 1.5/30, Junel Fe 1/20, Junel Fe 24, Larin Fe, Lo Loestrin Fe, Loestrin 24 Fe, Loestrin FE 1.5/30, Loestrin FE 1/20, Lomedia 24 Fe, Microgestin 24 Fe, Microgestin Fe 1.5/30, Microgestin Fe 1/20, Tarina Fe 1/20, Taytulla, Tilia Fe, Tri-Legest Fe What should I tell my health care provider before I take this medicine? They need to know if you have any of these conditions: -abnormal vaginal bleeding -blood vessel disease -breast, cervical, endometrial, ovarian, liver, or uterine cancer -diabetes -gallbladder disease -heart disease or recent heart attack -high blood pressure -high cholesterol -history of blood clots -kidney disease -liver disease -migraine headaches -smoke tobacco -stroke -systemic lupus erythematosus (SLE) -an unusual or allergic reaction to estrogens, progestins, other medicines, foods, dyes, or preservatives -pregnant or trying to get pregnant -breast-feeding How should I use this medicine? Take this medicine by mouth. To reduce nausea, this medicine may be taken with food. Follow the directions on the prescription label. Take this medicine at the same time each day and in the order directed on the package. Do not take your medicine more often than  directed. A patient package insert for the product will be given with each prescription and refill. Read this sheet carefully each time. The sheet may change frequently. Contact your pediatrician regarding the use of this medicine in children. Special care may be needed. This medicine has been used in female children who have started having menstrual periods. Overdosage: If you think you have taken too much of this medicine contact a poison control center or emergency room at once. NOTE: This medicine is only for you. Do not share this medicine with others. What if I miss a dose? If you miss a dose, refer to the patient information sheet you received with your medicine for direction. If you miss more than one pill, this medicine may not be as effective and you may need to use another form of birth control. What may interact with this medicine? Do not take this medicine with the following medication: -dasabuvir; ombitasvir; paritaprevir; ritonavir -ombitasvir; paritaprevir; ritonavir This medicine may also interact with the following medications: -acetaminophen -antibiotics or medicines for infections, especially rifampin, rifabutin, rifapentine, and griseofulvin, and possibly penicillins or tetracyclines -aprepitant -ascorbic acid (vitamin C) -atorvastatin -barbiturate medicines, such as phenobarbital -bosentan -carbamazepine -caffeine -clofibrate -cyclosporine -dantrolene -doxercalciferol -felbamate -grapefruit juice -hydrocortisone -medicines for anxiety or sleeping problems, such as diazepam or temazepam -medicines for diabetes, including pioglitazone -mineral oil -modafinil -mycophenolate -nefazodone -oxcarbazepine -phenytoin -prednisolone -ritonavir or other medicines for HIV infection or AIDS -rosuvastatin -selegiline -soy isoflavones supplements -St. John's wort -tamoxifen or raloxifene -theophylline -thyroid hormones -topiramate -warfarin This list may not  describe all possible interactions. Give your health care provider a list of all the medicines, herbs, non-prescription drugs, or dietary supplements you use. Also tell them if  you smoke, drink alcohol, or use illegal drugs. Some items may interact with your medicine. What should I watch for while using this medicine? Visit your doctor or health care professional for regular checks on your progress. You will need a regular breast and pelvic exam and Pap smear while on this medicine. Use an additional method of contraception during the first cycle that you take these tablets. If you have any reason to think you are pregnant, stop taking this medicine right away and contact your doctor or health care professional. If you are taking this medicine for hormone related problems, it may take several cycles of use to see improvement in your condition. Smoking increases the risk of getting a blood clot or having a stroke while you are taking birth control pills, especially if you are more than 33 years old. You are strongly advised not to smoke. This medicine can make your body retain fluid, making your fingers, hands, or ankles swell. Your blood pressure can go up. Contact your doctor or health care professional if you feel you are retaining fluid. This medicine can make you more sensitive to the sun. Keep out of the sun. If you cannot avoid being in the sun, wear protective clothing and use sunscreen. Do not use sun lamps or tanning beds/booths. If you wear contact lenses and notice visual changes, or if the lenses begin to feel uncomfortable, consult your eye care specialist. In some women, tenderness, swelling, or minor bleeding of the gums may occur. Notify your dentist if this happens. Brushing and flossing your teeth regularly may help limit this. See your dentist regularly and inform your dentist of the medicines you are taking. If you are going to have elective surgery, you may need to stop taking this  medicine before the surgery. Consult your health care professional for advice. This medicine does not protect you against HIV infection (AIDS) or any other sexually transmitted diseases. What side effects may I notice from receiving this medicine? Side effects that you should report to your doctor or health care professional as soon as possible: -allergic reactions like skin rash, itching or hives, swelling of the face, lips, or tongue -breast tissue changes or discharge -changes in vaginal bleeding during your period or between your periods -changes in vision -chest pain -confusion -coughing up blood -dizziness -feeling faint or lightheaded -headaches or migraines -leg, arm or groin pain -loss of balance or coordination -severe or sudden headaches -stomach pain (severe) -sudden shortness of breath -sudden numbness or weakness of the face, arm or leg -symptoms of vaginal infection like itching, irritation or unusual discharge -tenderness in the upper abdomen -trouble speaking or understanding -vomiting -yellowing of the eyes or skin Side effects that usually do not require medical attention (report to your doctor or health care professional if they continue or are bothersome): -breakthrough bleeding and spotting that continues beyond the 3 initial cycles of pills -breast tenderness -mood changes, anxiety, depression, frustration, anger, or emotional outbursts -increased sensitivity to sun or ultraviolet light -nausea -skin rash, acne, or brown spots on the skin -weight gain (slight) This list may not describe all possible side effects. Call your doctor for medical advice about side effects. You may report side effects to FDA at 1-800-FDA-1088. Where should I keep my medicine? Keep out of the reach of children. Store at room temperature between 15 and 30 degrees C (59 and 86 degrees F). Throw away any unused medicine after the expiration date. NOTE: This sheet is a summary.  It may  not cover all possible information. If you have questions about this medicine, talk to your doctor, pharmacist, or health care provider.  2018 Elsevier/Gold Standard (2016-03-10 08:04:41)

## 2017-01-12 NOTE — Progress Notes (Signed)
HPI:      [name] is a 31 y.o. [phone] who LMP was No LMP recorded., she presents today for her annual examination. The patient has no complaints today. The patient is sexually active. Her last pap: approximate date 2017 and was normal. The patient does perform self breast exams.  There is no notable family history of breast or ovarian cancer in her family.  The patient has regular exercise: yes.  The patient denies current symptoms of depression.  Husband has been having sex with other women, separated, concern for STD.  GYN History: Contraception: IUD  PMHx: Past Medical History:  Diagnosis Date  . Type AB blood, Rh negative    Past Surgical History:  Procedure Laterality Date  . CHOLECYSTECTOMY    . GALLBLADDER SURGERY  7-06   Family History  Problem Relation Age of Onset  . Suicidality Father        2011  . Diabetes Maternal Grandfather    Social History  Substance Use Topics  . Smoking status: Never Smoker  . Smokeless tobacco: Never Used  . Alcohol use No     Comment: SOCIALLY    Current Outpatient Prescriptions:  .  acetaminophen (TYLENOL) 500 MG tablet, Take 1,000 mg by mouth every 6 (six) hours as needed for mild pain or headache., Disp: , Rfl:  .  ibuprofen (ADVIL,MOTRIN) 600 MG tablet, Take 1 tablet (600 mg total) by mouth every 6 (six) hours., Disp: 30 tablet, Rfl: 1 .  Norethindrone-Ethinyl Estradiol-Fe Biphas (LO LOESTRIN FE) 1 MG-10 MCG / 10 MCG tablet, Take 1 tablet by mouth daily., Disp: 1 Package, Rfl: 11 .  Prenatal Vit-Fe Fumarate-FA (PRENATAL MULTIVITAMIN) TABS tablet, Take 1 tablet by mouth daily. , Disp: , Rfl:  .  sertraline (ZOLOFT) 50 MG tablet, Take 1/2 tablet daily (Patient not taking: Reported on 11/10/2016), Disp: 30 tablet, Rfl: 3 Allergies: Latex  ROS  Objective: BP 110/70   Pulse 85   Ht 5\' 3"  (1.6 m)   Wt 230 lb (104.3 kg)   BMI 40.74 kg/m   Filed Weights   01/12/17 1536  Weight: 230 lb (104.3 kg)   Body mass index is  40.74 kg/m. OBGyn Exam  Assessment:  ANNUAL EXAM 1. Annual physical exam   2. Screening for cervical cancer   3. IUD check up   4. STD exposure      Screening Plan:            1.  Cervical Screening-  Pap smear done today  2. Breast screening- Exam annually and mammogram>40 planned   3. Colonoscopy every 10 years, Hemoccult testing - after age 53  4. Labs managed by PCP  5. Counseling for contraception: does not like periods and wishes to change to a hormonal BC  Other:  1. Annual physical exam  2. Screening for cervical cancer - IGP,CtNgTv,Apt HPV,rfx16/18,45  3. IUD check up Pelvic exam:  Two IUD strings present seen coming from the cervical os. EGBUS, vaginal vault and cervix: within normal limits  IUD Removal Strings of IUD identified and grasped.  IUD removed without problem.  Pt tolerated this well.  IUD noted to be intact. Plan: IUD removed and plan for contraception is oral contraceptives (estrogen/progesterone). She was amenable to this plan.  4. STD exposure - IGP,CtNgTv,Apt HPV,rfx16/18,45 - HEP, RPR, HIV Panel - HSV(herpes smplx)abs-1+2(IgG+IgM)-bld    F/U  Return in about 1 year (around 01/12/2018) for Annual (OK to cancel Aug appt).  Renae Fickle  Tiburcio PeaHarris, MD, Merlinda FrederickFACOG [facility], [facility] 01/12/2017  4:28 PM

## 2017-01-14 LAB — HSV(HERPES SMPLX)ABS-I+II(IGG+IGM)-BLD
HSV 1 Glycoprotein G Ab, IgG: 25.4 index — ABNORMAL HIGH (ref 0.00–0.90)
HSV 2 Glycoprotein G Ab, IgG: <0.91 index (ref 0.00–0.90)
HSVI/II Comb IgM: 1.63 Ratio — ABNORMAL HIGH (ref 0.00–0.90)

## 2017-01-14 LAB — HEP, RPR, HIV PANEL
HEP B S AG: NEGATIVE
HIV SCREEN 4TH GENERATION: NONREACTIVE
RPR: NONREACTIVE

## 2017-01-15 LAB — IGP,CTNGTV,APT HPV,RFX16/18,45
CHLAMYDIA, NUC. ACID AMP: NEGATIVE
GONOCOCCUS, NUC. ACID AMP: NEGATIVE
HPV APTIMA: NEGATIVE
PAP Smear Comment: 0
TRICH VAG BY NAA: NEGATIVE

## 2017-02-16 ENCOUNTER — Ambulatory Visit: Payer: Medicaid Other | Admitting: Obstetrics & Gynecology

## 2017-02-17 ENCOUNTER — Ambulatory Visit: Payer: Medicaid Other | Admitting: Obstetrics & Gynecology

## 2017-07-28 DIAGNOSIS — F900 Attention-deficit hyperactivity disorder, predominantly inattentive type: Secondary | ICD-10-CM | POA: Diagnosis not present

## 2017-07-28 DIAGNOSIS — Z23 Encounter for immunization: Secondary | ICD-10-CM | POA: Diagnosis not present

## 2017-08-04 ENCOUNTER — Ambulatory Visit (INDEPENDENT_AMBULATORY_CARE_PROVIDER_SITE_OTHER): Payer: Self-pay

## 2017-08-04 ENCOUNTER — Ambulatory Visit (INDEPENDENT_AMBULATORY_CARE_PROVIDER_SITE_OTHER): Payer: BLUE CROSS/BLUE SHIELD | Admitting: Orthopaedic Surgery

## 2017-08-04 ENCOUNTER — Encounter (INDEPENDENT_AMBULATORY_CARE_PROVIDER_SITE_OTHER): Payer: Self-pay | Admitting: Orthopaedic Surgery

## 2017-08-04 DIAGNOSIS — M79642 Pain in left hand: Secondary | ICD-10-CM

## 2017-08-04 DIAGNOSIS — M79641 Pain in right hand: Secondary | ICD-10-CM

## 2017-08-04 NOTE — Progress Notes (Signed)
Office Visit Note   Patient: Paula Sullivan           Date of Birth: 25-Jan-1986           MRN: [id number] Visit Date: 08/04/2017              Requested by: No referring provider defined for this encounter. PCP: Tomi BambergerFuller, Susan, NP (Inactive)   Assessment & Plan: Visit Diagnoses:  1. Bilateral hand pain     Plan: Impression is right hand numbness.  Recommend nerve conduction study to evaluate for carpal tunnel syndrome versus cubital tunnel syndrome.  For now I have asked her sleep with her elbows extended and wear carpal tunnel splints to see if this will improve her symptoms.  Follow-up after the nerve conduction studies.  Follow-Up Instructions: Return if symptoms worsen or fail to improve.   Orders:  Orders Placed This Encounter  Procedures  . XR Hand Complete Left  . XR Hand Complete Right   No orders of the defined types were placed in this encounter.     Procedures: No procedures performed   Clinical Data: No additional findings.   Subjective: Chief Complaint  Patient presents with  . Left Hand - Pain  . Right Hand - Pain    Patient is a 32 year old female with mainl comes in withy right hand numbness and tingling more so on the ulnar half of the hand and fingers.  She will wake up with the hand numb.  She works as an Airline pilotaccountant.  The pain will shoot up the forearm.  She denies any injuries.  She first developed symptoms when she was pregnant but this did not improve after she delivered.    Review of Systems  Constitutional: Negative.   HENT: Negative.   Eyes: Negative.   Respiratory: Negative.   Cardiovascular: Negative.   Endocrine: Negative.   Musculoskeletal: Negative.   Neurological: Negative.   Hematological: Negative.   Psychiatric/Behavioral: Negative.   All other systems reviewed and are negative.    Objective: Vital Signs: There were no vitals taken for this visit.  Physical Exam  Constitutional: She is oriented to person, place, and  time. She appears well-developed and well-nourished.  HENT:  Head: Normocephalic and atraumatic.  Eyes: EOM are normal.  Neck: Neck supple.  Pulmonary/Chest: Effort normal.  Abdominal: Soft.  Neurological: She is alert and oriented to person, place, and time.  Skin: Skin is warm. Capillary refill takes less than 2 seconds.  Psychiatric: She has a normal mood and affect. Her behavior is normal. Judgment and thought content normal.  Nursing note and vitals reviewed.   Ortho Exam Right hand exam shows negative carpal tunnel compressive signs.  Equivocal Tinel sign at the cubital tunnel.  She has normal sensation.  No muscular atrophy. Specialty Comments:  No specialty comments available.  Imaging: Xr Hand Complete Left  Result Date: 08/04/2017 o acute or structural abnormalities  Xr Hand Complete Right  Result Date: 08/04/2017 No acute or structural abnormalities    PMFS History: Patient Active Problem List   Diagnosis Date Noted  . IUD check up 01/12/2017  . Headache 07/23/2016  . Lower abdominal pain 07/13/2016   Past Medical History:  Diagnosis Date  . Type AB blood, Rh negative     Family History  Problem Relation Age of Onset  . Suicidality Father        2011  . Diabetes Maternal Grandfather     Past Surgical History:  Procedure Laterality Date  .  CHOLECYSTECTOMY    . GALLBLADDER SURGERY  7-06   Social History   Occupational History  . Not on file  Tobacco Use  . Smoking status: Never Smoker  . Smokeless tobacco: Never Used  Substance and Sexual Activity  . Alcohol use: No    Comment: SOCIALLY  . Drug use: No  . Sexual activity: Yes    Birth control/protection: IUD

## 2017-08-10 ENCOUNTER — Encounter (INDEPENDENT_AMBULATORY_CARE_PROVIDER_SITE_OTHER): Payer: Self-pay | Admitting: Orthopaedic Surgery

## 2017-08-11 ENCOUNTER — Telehealth (INDEPENDENT_AMBULATORY_CARE_PROVIDER_SITE_OTHER): Payer: Self-pay

## 2017-08-11 NOTE — Telephone Encounter (Signed)
Hi,     I am supposed to come in to have some nerve testing done. I am off this coming Friday as well as Monday and was wondering if I could get in one of those days? Also, Dr. Roda ShuttersXu said something about giving me braces for my hands/wrist and I never received them. If you would please give me a call when you get a chance. [phone] thanks so much.     Paula Sullivan

## 2017-08-12 ENCOUNTER — Other Ambulatory Visit (INDEPENDENT_AMBULATORY_CARE_PROVIDER_SITE_OTHER): Payer: Self-pay

## 2017-08-12 DIAGNOSIS — M25532 Pain in left wrist: Principal | ICD-10-CM

## 2017-08-12 DIAGNOSIS — M25531 Pain in right wrist: Secondary | ICD-10-CM

## 2017-08-12 NOTE — Telephone Encounter (Signed)
No referral in chart for a nerve study.

## 2017-08-12 NOTE — Telephone Encounter (Signed)
Sorry. Order made.

## 2017-09-02 ENCOUNTER — Encounter (INDEPENDENT_AMBULATORY_CARE_PROVIDER_SITE_OTHER): Payer: Self-pay | Admitting: Physical Medicine and Rehabilitation

## 2017-09-02 ENCOUNTER — Ambulatory Visit (INDEPENDENT_AMBULATORY_CARE_PROVIDER_SITE_OTHER): Payer: BLUE CROSS/BLUE SHIELD | Admitting: Physical Medicine and Rehabilitation

## 2017-09-02 DIAGNOSIS — R202 Paresthesia of skin: Secondary | ICD-10-CM | POA: Diagnosis not present

## 2017-09-02 NOTE — Progress Notes (Deleted)
Numbness in the morning from mid forearms to fingers. Started when she was three months pregnant. Difficulty opening jars, bottle caps. Difficulty holding her child at times. Sometimes the numbness goes away during the day, sometimes it seems to last longer. Pain in forearm when writing. Right hand dominant.

## 2017-09-03 NOTE — Progress Notes (Signed)
Paula Sullivan - 32 y.o. female MRN [id number]  Date of birth: 10/02/1985  Office Visit Note: Visit Date: 09/02/2017 PCP: Tomi Bamberger, NP (Inactive) Referred by: No ref. provider found  Subjective: Chief Complaint  Patient presents with  . Right Hand - Numbness  . Left Hand - Numbness   HPI: Paula Sullivan is a 32 year old right-hand-dominant female who comes in today at the request of Dr. Roda Shutters for electrodiagnostic studies of both upper limbs.  She reports numbness particularly in the morning from about the mid forearm particularly on the right into the middle fourth and fifth digits.  She reports difficulty opening jars and bottles.  She has difficulty holding her child at times.  She reports that sometimes the numbness goes away during the day and then sometimes it will last longer.  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  She gets a lot of cramping pain down into the dorsal limb of the hand with writing.  She will have to stop writing at times because of the pain.  She reports an interesting incident when she was pregnant and having her child.  She reported an incident where she had complete numbness from the upper extremity and lower extremity essentially hemibody.  This lasted for several days and at least at the time she reports that no one could figure out what was going on.  This did seem to recover but since that time she has had the continued numbness in the hands and fingers.  She has not had any cervical spine MRI.  She does not endorse any neck pain.  She has had no specific injury to the hands.  She has had no prior electrodiagnostic studies.    ROS Otherwise per HPI.  Assessment & Plan: Visit Diagnoses:  1. Paresthesia of skin     Plan: No additional findings.  Impression: Essentially NORMAL electrodiagnostic study of both upper limbs.  There is no significant electrodiagnostic evidence of nerve entrapment, brachial plexopathy or cervical  radiculopathy.    As you know, purely sensory or demyelinating radiculopathies and chemical radiculitis may not be detected with this particular electrodiagnostic study.  This electrodiagnostic study cannot rule out small fiber polyneuropathy and dysesthesias from central pain sensitization syndromes such as fibromyalgia.  Recommendations: 1.  Follow-up with referring physician. 2.  Continue current management of symptoms.   Meds & Orders: No orders of the defined types were placed in this encounter.   Orders Placed This Encounter  Procedures  . NCV with EMG (electromyography)    Follow-up: Return for Dr. Roda Shutters.   Procedures: No procedures performed  EMG & NCV Findings: All nerve conduction studies (as indicated in the following tables) were within normal limits.  All left vs. right side differences were within normal limits.    All examined muscles (as indicated in the following table) showed no evidence of electrical instability.    Impression: Essentially NORMAL electrodiagnostic study of both upper limbs.  There is no significant electrodiagnostic evidence of nerve entrapment, brachial plexopathy or cervical radiculopathy.    As you know, purely sensory or demyelinating radiculopathies and chemical radiculitis may not be detected with this particular electrodiagnostic study.  This electrodiagnostic study cannot rule out small fiber polyneuropathy and dysesthesias from central pain sensitization syndromes such as fibromyalgia.  Recommendations: 1.  Follow-up with referring physician. 2.  Continue current management of symptoms.   Nerve Conduction Studies Anti Sensory Summary Table   Stim Site  NR Peak (ms) Norm Peak (ms) P-T Amp (V) Norm P-T Amp Site1 Site2 Delta-P (ms) Dist (cm) Vel (m/s) Norm Vel (m/s)  Left Median Acr Palm Anti Sensory (2nd Digit)  32.4C  Wrist    3.7 <4.0 42.6 >10 Wrist Palm 2.0 0.0    Palm    1.7 <2.0 15.9         Right Median Acr Palm Anti Sensory  (2nd Digit)  31.8C  Wrist    3.8 <4.0 24.2 >10 Wrist Palm 1.9 0.0    Palm    1.9 <2.0 17.2         Right Radial Anti Sensory (Base 1st Digit)  32.1C  Wrist    2.1 <3.1 21.3  Wrist Base 1st Digit 2.1 0.0    Right Ulnar Anti Sensory (5th Digit)  32.1C  Wrist    2.9 <3.7 31.0 >15.0 Wrist 5th Digit 2.9 14.0 48 >38   Motor Summary Table   Stim Site NR Onset (ms) Norm Onset (ms) O-P Amp (mV) Norm O-P Amp Site1 Site2 Delta-0 (ms) Dist (cm) Vel (m/s) Norm Vel (m/s)  Left Median Motor (Abd Poll Brev)  33.7C  Wrist    3.6 <4.2 6.9 >5 Elbow Wrist 3.4 19.0 56 >50  Elbow    7.0  6.5         Right Median Motor (Abd Poll Brev)  32.1C  Wrist    4.0 <4.2 8.5 >5 Elbow Wrist 3.5 19.0 54 >50  Elbow    7.5  7.9         Right Ulnar Motor (Abd Dig Min)  32.4C  Wrist    3.1 <4.2 9.5 >3 B Elbow Wrist 2.7 19.5 72 >53  B Elbow    5.8  9.8  A Elbow B Elbow 1.1 10.0 91 >53  A Elbow    6.9  7.3          EMG   Side Muscle Nerve Root Ins Act Fibs Psw Amp Dur Poly Recrt Int Dennie BiblePat Comment  Right Abd Poll Brev Median C8-T1 Nml Nml Nml Nml Nml 0 Nml Nml   Right 1stDorInt Ulnar C8-T1 Nml Nml Nml Nml Nml 0 Nml Nml   Right PronatorTeres Median C6-7 Nml Nml Nml Nml Nml 0 Nml Nml   Right Biceps Musculocut C5-6 Nml Nml Nml Nml Nml 0 Nml Nml   Right Deltoid Axillary C5-6 Nml Nml Nml Nml Nml 0 Nml Nml     Nerve Conduction Studies Anti Sensory Left/Right Comparison   Stim Site L Lat (ms) R Lat (ms) L-R Lat (ms) L Amp (V) R Amp (V) L-R Amp (%) Site1 Site2 L Vel (m/s) R Vel (m/s) L-R Vel (m/s)  Median Acr Palm Anti Sensory (2nd Digit)  32.4C  Wrist 3.7 3.8 0.1 42.6 24.2 43.2 Wrist Palm     Palm 1.7 1.9 0.2 15.9 17.2 7.6       Radial Anti Sensory (Base 1st Digit)  32.1C  Wrist  2.1   21.3  Wrist Base 1st Digit     Ulnar Anti Sensory (5th Digit)  32.1C  Wrist  2.9   31.0  Wrist 5th Digit  48    Motor Left/Right Comparison   Stim Site L Lat (ms) R Lat (ms) L-R Lat (ms) L Amp (mV) R Amp (mV) L-R Amp (%) Site1  Site2 L Vel (m/s) R Vel (m/s) L-R Vel (m/s)  Median Motor (Abd Poll Brev)  33.7C  Wrist 3.6 4.0 0.4 6.9 8.5 18.8 Elbow  Wrist 56 54 2  Elbow 7.0 7.5 0.5 6.5 7.9 17.7       Ulnar Motor (Abd Dig Min)  32.4C  Wrist  3.1   9.5  B Elbow Wrist  72   B Elbow  5.8   9.8  A Elbow B Elbow  91   A Elbow  6.9   7.3           Waveforms:                Clinical History: No specialty comments available.  She reports that  has never smoked. she has never used smokeless tobacco. No results for input(s): HGBA1C, LABURIC in the last 8760 hours.  Objective:  VS:  HT:    WT:   BMI:     BP:   HR: bpm  TEMP: ( )  RESP:  Physical Exam  Musculoskeletal:  Inspection reveals no atrophy of the bilateral APB or FDI or hand intrinsics. There is no swelling, color changes, allodynia or dystrophic changes. There is 5 out of 5 strength in the bilateral wrist extension, finger abduction and long finger flexion. There is intact sensation to light touch in all dermatomal and peripheral nerve distributions.  There is a negative Phalen's test bilaterally. There is a negative Hoffmann's test bilaterally.    Ortho Exam Imaging: No results found.  Past Medical/Family/Surgical/Social History: Medications & Allergies reviewed per EMR Patient Active Problem List   Diagnosis Date Noted  . IUD check up 01/12/2017  . Headache 07/23/2016  . Lower abdominal pain 07/13/2016   Past Medical History:  Diagnosis Date  . Type AB blood, Rh negative    Family History  Problem Relation Age of Onset  . Suicidality Father        2011  . Diabetes Maternal Grandfather    Past Surgical History:  Procedure Laterality Date  . CHOLECYSTECTOMY    . GALLBLADDER SURGERY  7-06   Social History   Occupational History  . Not on file  Tobacco Use  . Smoking status: Never Smoker  . Smokeless tobacco: Never Used  Substance and Sexual Activity  . Alcohol use: No    Comment: SOCIALLY  . Drug use: No  . Sexual activity:  Yes    Birth control/protection: IUD

## 2017-09-04 NOTE — Procedures (Signed)
EMG & NCV Findings: All nerve conduction studies (as indicated in the following tables) were within normal limits.  All left vs. right side differences were within normal limits.    All examined muscles (as indicated in the following table) showed no evidence of electrical instability.    Impression: Essentially NORMAL electrodiagnostic study of both upper limbs.  There is no significant electrodiagnostic evidence of nerve entrapment, brachial plexopathy or cervical radiculopathy.    As you know, purely sensory or demyelinating radiculopathies and chemical radiculitis may not be detected with this particular electrodiagnostic study.  This electrodiagnostic study cannot rule out small fiber polyneuropathy and dysesthesias from central pain sensitization syndromes such as fibromyalgia.  Recommendations: 1.  Follow-up with referring physician. 2.  Continue current management of symptoms.   Nerve Conduction Studies Anti Sensory Summary Table   Stim Site NR Peak (ms) Norm Peak (ms) P-T Amp (V) Norm P-T Amp Site1 Site2 Delta-P (ms) Dist (cm) Vel (m/s) Norm Vel (m/s)  Left Median Acr Palm Anti Sensory (2nd Digit)  32.4C  Wrist    3.7 <4.0 42.6 >10 Wrist Palm 2.0 0.0    Palm    1.7 <2.0 15.9         Right Median Acr Palm Anti Sensory (2nd Digit)  31.8C  Wrist    3.8 <4.0 24.2 >10 Wrist Palm 1.9 0.0    Palm    1.9 <2.0 17.2         Right Radial Anti Sensory (Base 1st Digit)  32.1C  Wrist    2.1 <3.1 21.3  Wrist Base 1st Digit 2.1 0.0    Right Ulnar Anti Sensory (5th Digit)  32.1C  Wrist    2.9 <3.7 31.0 >15.0 Wrist 5th Digit 2.9 14.0 48 >38   Motor Summary Table   Stim Site NR Onset (ms) Norm Onset (ms) O-P Amp (mV) Norm O-P Amp Site1 Site2 Delta-0 (ms) Dist (cm) Vel (m/s) Norm Vel (m/s)  Left Median Motor (Abd Poll Brev)  33.7C  Wrist    3.6 <4.2 6.9 >5 Elbow Wrist 3.4 19.0 56 >50  Elbow    7.0  6.5         Right Median Motor (Abd Poll Brev)  32.1C  Wrist    4.0 <4.2 8.5 >5  Elbow Wrist 3.5 19.0 54 >50  Elbow    7.5  7.9         Right Ulnar Motor (Abd Dig Min)  32.4C  Wrist    3.1 <4.2 9.5 >3 B Elbow Wrist 2.7 19.5 72 >53  B Elbow    5.8  9.8  A Elbow B Elbow 1.1 10.0 91 >53  A Elbow    6.9  7.3          EMG   Side Muscle Nerve Root Ins Act Fibs Psw Amp Dur Poly Recrt Int Dennie BiblePat Comment  Right Abd Poll Brev Median C8-T1 Nml Nml Nml Nml Nml 0 Nml Nml   Right 1stDorInt Ulnar C8-T1 Nml Nml Nml Nml Nml 0 Nml Nml   Right PronatorTeres Median C6-7 Nml Nml Nml Nml Nml 0 Nml Nml   Right Biceps Musculocut C5-6 Nml Nml Nml Nml Nml 0 Nml Nml   Right Deltoid Axillary C5-6 Nml Nml Nml Nml Nml 0 Nml Nml     Nerve Conduction Studies Anti Sensory Left/Right Comparison   Stim Site L Lat (ms) R Lat (ms) L-R Lat (ms) L Amp (V) R Amp (V) L-R Amp (%) Site1 Site2 L Vel (  m/s) R Vel (m/s) L-R Vel (m/s)  Median Acr Palm Anti Sensory (2nd Digit)  32.4C  Wrist 3.7 3.8 0.1 42.6 24.2 43.2 Wrist Palm     Palm 1.7 1.9 0.2 15.9 17.2 7.6       Radial Anti Sensory (Base 1st Digit)  32.1C  Wrist  2.1   21.3  Wrist Base 1st Digit     Ulnar Anti Sensory (5th Digit)  32.1C  Wrist  2.9   31.0  Wrist 5th Digit  48    Motor Left/Right Comparison   Stim Site L Lat (ms) R Lat (ms) L-R Lat (ms) L Amp (mV) R Amp (mV) L-R Amp (%) Site1 Site2 L Vel (m/s) R Vel (m/s) L-R Vel (m/s)  Median Motor (Abd Poll Brev)  33.7C  Wrist 3.6 4.0 0.4 6.9 8.5 18.8 Elbow Wrist 56 54 2  Elbow 7.0 7.5 0.5 6.5 7.9 17.7       Ulnar Motor (Abd Dig Min)  32.4C  Wrist  3.1   9.5  B Elbow Wrist  72   B Elbow  5.8   9.8  A Elbow B Elbow  91   A Elbow  6.9   7.3           Waveforms:

## 2017-09-14 ENCOUNTER — Ambulatory Visit (INDEPENDENT_AMBULATORY_CARE_PROVIDER_SITE_OTHER): Payer: Medicaid Other | Admitting: Orthopaedic Surgery

## 2017-09-15 ENCOUNTER — Encounter: Payer: Self-pay | Admitting: Obstetrics and Gynecology

## 2017-12-10 ENCOUNTER — Other Ambulatory Visit: Payer: Self-pay

## 2017-12-10 ENCOUNTER — Encounter: Payer: Self-pay | Admitting: Physician Assistant

## 2017-12-10 ENCOUNTER — Ambulatory Visit (INDEPENDENT_AMBULATORY_CARE_PROVIDER_SITE_OTHER): Payer: BLUE CROSS/BLUE SHIELD | Admitting: Physician Assistant

## 2017-12-10 VITALS — BP 106/72 | HR 77 | Temp 98.6°F | Resp 20 | Ht 63.19 in | Wt 222.4 lb

## 2017-12-10 DIAGNOSIS — M25562 Pain in left knee: Secondary | ICD-10-CM

## 2017-12-10 DIAGNOSIS — G8929 Other chronic pain: Secondary | ICD-10-CM

## 2017-12-10 MED ORDER — NAPROXEN 500 MG PO TABS: 500.0000 mg | ORAL_TABLET | Freq: Two times a day (BID) | ORAL | 0 refills | Status: DC

## 2017-12-10 NOTE — Progress Notes (Addendum)
Paula Sullivan  MRN: [id number] DOB: March 27, 1986  Subjective:   Paula Sullivan is a 32 y.o. female who presents with acute on chronic knee pain involving the left knee. Initial onset was years ago. Inciting event: two injuries in the past, one in 2009 and one a few years ago. No new injury. Denies redness and warmth. Has been pain free for sometime but just started having dull ache and swelling x 3 days ago.  Pain is aggravated by straightening leg out. . Evaluation to date: has been seen by murphy wainer in the past. Has had xray, which was normal, and MRI, which she thinks showed some meniscus injury. Was given injections and did have drainage, which helped. Has not followed up with them in a couple of years as the knee has not really bothered her again until now. For this current episode, has tried some ibuprofen. Put ice on it the first day but has not since. Has not tried rest and brace.  Review of Systems  Constitutional: Negative for chills, diaphoresis and fever.    Patient Active Problem List   Diagnosis Date Noted  . IUD check up 01/12/2017  . Headache 07/23/2016  . Lower abdominal pain 07/13/2016    Current Outpatient Medications on File Prior to Visit  Medication Sig Dispense Refill  . acetaminophen (TYLENOL) 500 MG tablet Take 1,000 mg by mouth every 6 (six) hours as needed for mild pain or headache.    . ADDERALL XR 20 MG 24 hr capsule   0  . ibuprofen (ADVIL,MOTRIN) 600 MG tablet Take 1 tablet (600 mg total) by mouth every 6 (six) hours. (Patient not taking: Reported on 08/04/2017) 30 tablet 1  . Norethindrone-Ethinyl Estradiol-Fe Biphas (LO LOESTRIN FE) 1 MG-10 MCG / 10 MCG tablet Take 1 tablet by mouth daily. (Patient not taking: Reported on 08/04/2017) 1 Package 11  . Prenatal Vit-Fe Fumarate-FA (PRENATAL MULTIVITAMIN) TABS tablet Take 1 tablet by mouth daily.     . sertraline (ZOLOFT) 50 MG tablet Take 1/2 tablet daily (Patient not taking: Reported on 11/10/2016) 30 tablet 3     No current facility-administered medications on file prior to visit.     Allergies  Allergen Reactions  . Latex Rash    Unknown childhood reaction.       Social History   Socioeconomic History  . Marital status: Single    Spouse name: Not on file  . Number of children: 1  . Years of education: Not on file  . Highest education level: Not on file  Occupational History  . Not on file  Social Needs  . Financial resource strain: Not on file  . Food insecurity:    Worry: Not on file    Inability: Not on file  . Transportation needs:    Medical: Not on file    Non-medical: Not on file  Tobacco Use  . Smoking status: Never Smoker  . Smokeless tobacco: Never Used  Substance and Sexual Activity  . Alcohol use: Yes    Comment: SOCIALLY  . Drug use: No  . Sexual activity: Not Currently  Lifestyle  . Physical activity:    Days per week: Not on file    Minutes per session: Not on file  . Stress: Not on file  Relationships  . Social connections:    Talks on phone: Not on file    Gets together: Not on file    Attends religious service: Not on file    Active  member of club or organization: Not on file    Attends meetings of clubs or organizations: Not on file    Relationship status: Not on file  . Intimate partner violence:    Fear of current or ex partner: Not on file    Emotionally abused: Not on file    Physically abused: Not on file    Forced sexual activity: Not on file  Other Topics Concern  . Not on file  Social History Narrative  . Not on file    Objective:  BP 106/72 (BP Location: Left Arm, Patient Position: Sitting, Cuff Size: Large)   Pulse 77   Temp 98.6 F (37 C) (Oral)   Resp 20   Ht 5' 3.19" (1.605 m)   Wt 222 lb 6.4 oz (100.9 kg)   LMP 11/26/2017 (Approximate)   SpO2 98%   Breastfeeding? No   BMI 39.16 kg/m   Physical Exam  Constitutional: She is oriented to person, place, and time. She appears well-developed and well-nourished.  HENT:   Head: Normocephalic and atraumatic.  Eyes: Conjunctivae are normal.  Neck: Normal range of motion.  Cardiovascular:  Pulses:      Dorsalis pedis pulses are 2+ on the right side, and 2+ on the left side.  Pulmonary/Chest: Effort normal.  Musculoskeletal:       Right knee: Normal.       Left knee: She exhibits swelling (mild swelling noted in suprapatellar region; however difficult to distinguish due to large body habitus). She exhibits normal range of motion, no ecchymosis, no erythema and no bony tenderness. Tenderness (mild TTP with palpation of patellar region) found. Patellar tendon tenderness noted. No medial joint line, no lateral joint line, no MCL and no LCL tenderness noted.  Negative Valgus/Varus Stress Test, Anterior/Posterior Drawer Test, and McMurray Stress Test.   No overlying erythema or warmth noted in left knee.   Neurological: She is alert and oriented to person, place, and time.  Sensation of BLE intact.   Skin: Skin is warm and dry.  Psychiatric: She has a normal mood and affect.  Vitals reviewed.   Assessment and Plan :  1. Acute pain of left knee Acute on chronic knee pain. No recent injury. I cannot see pt's prior MRI but this is likely related to previous meniscus injury. Recommended conservative tx at this time with rest, ice, brace, and NSAIDs. If no improvement by next week, rec she return to see Dr. Neva Seat for potential injection or she can f/u with Delbert Harness.  - naproxen (NAPROSYN) 500 MG tablet; Take 1 tablet (500 mg total) by mouth 2 (two) times daily with a meal.  Dispense: 30 tablet; Refill: 0 - Knee brace - Care order/instruction:  Benjiman Core PA-C  Primary Care at [facility] 12/10/2017 12:14 PM

## 2017-12-10 NOTE — Patient Instructions (Addendum)
I recommend conservative treatment at this time with brace, ice, anti-inflammatories, and rest.  Apply ice 4-5 times a day for 20 minutes at a time.  Take anti-inflammatories as prescribed.  If no improvement after 1 week, please return to office for further evaluation and potential knee injection.  Please make follow-up appointment with Dr. Neva Seat.  We are both here on Monday, Tuesday, and Thursday of next week.  Knee Pain, Adult Many things can cause knee pain. The pain often goes away on its own with time and rest. If the pain does not go away, tests may be done to find out what is causing the pain. Follow these instructions at home: Activity  Rest your knee.  Do not do things that cause pain.  Avoid activities where both feet leave the ground at the same time (high-impact activities). Examples are running, jumping rope, and doing jumping jacks. General instructions  Take medicines only as told by your doctor.  Raise (elevate) your knee when you are resting. Make sure your knee is higher than your heart.  Sleep with a pillow under your knee.  If told, put ice on the knee: ? Put ice in a plastic bag. ? Place a towel between your skin and the bag. ? Leave the ice on for 20 minutes, 2-3 times a day.  Ask your doctor if you should wear an elastic knee support.  Lose weight if you are overweight. Being overweight can make your knee hurt more.  Do not use any tobacco products. These include cigarettes, chewing tobacco, or electronic cigarettes. If you need help quitting, ask your doctor. Smoking may slow down healing. Contact a doctor if:  The pain does not stop.  The pain changes or gets worse.  You have a fever along with knee pain.  Your knee gives out or locks up.  Your knee swells, and becomes worse. Get help right away if:  Your knee feels warm.  You cannot move your knee.  You have very bad knee pain.  You have chest pain.  You have trouble  breathing. Summary  Many things can cause knee pain. The pain often goes away on its own with time and rest.  Avoid activities that put stress on your knee. These include running and jumping rope.  Get help right away if you cannot move your knee, or if your knee feels warm, or if you have trouble breathing. This information is not intended to replace advice given to you by your health care provider. Make sure you discuss any questions you have with your health care provider. Document Released: 09/26/2008 Document Revised: 06/24/2016 Document Reviewed: 06/24/2016 Elsevier Interactive Patient Education  2017 [employer].     IF you received an x-ray today, you will receive an invoice from [facility]. Please contact [facility] at [phone] with questions or concerns regarding your invoice.   IF you received labwork today, you will receive an invoice from [facility]. Please contact [facility] at [phone] with questions or concerns regarding your invoice.   Our billing staff will not be able to assist you with questions regarding bills from these companies.  You will be contacted with the lab results as soon as they are available. The fastest way to get your results is to activate your My Chart account. Instructions are located on the last page of this paperwork. If you have not heard from [place] regarding the results in 2 weeks, please contact this office.

## 2017-12-14 ENCOUNTER — Ambulatory Visit (INDEPENDENT_AMBULATORY_CARE_PROVIDER_SITE_OTHER): Payer: BLUE CROSS/BLUE SHIELD | Admitting: Family Medicine

## 2017-12-14 ENCOUNTER — Ambulatory Visit (INDEPENDENT_AMBULATORY_CARE_PROVIDER_SITE_OTHER): Payer: BLUE CROSS/BLUE SHIELD

## 2017-12-14 ENCOUNTER — Other Ambulatory Visit: Payer: Self-pay

## 2017-12-14 ENCOUNTER — Encounter: Payer: Self-pay | Admitting: Family Medicine

## 2017-12-14 VITALS — BP 96/70 | HR 88 | Temp 98.5°F | Ht 63.0 in | Wt 224.6 lb

## 2017-12-14 DIAGNOSIS — M25562 Pain in left knee: Secondary | ICD-10-CM

## 2017-12-14 DIAGNOSIS — M25462 Effusion, left knee: Secondary | ICD-10-CM
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
## 2017-12-14 NOTE — Patient Instructions (Addendum)
Injection from today should improve your symptoms over the next few weeks, but to try to avoid repetitive/overuse of that knee for the next 2 weeks.    If your symptoms are not improving with the use of brace as well as the injection from today, let me know and I can refer you back to [facility] or we can proceed with an MRI.  Thank you for coming in today.   Knee Pain, Adult Knee pain in adults is common. It can be caused by many things, including:  Arthritis.  A fluid-filled sac (cyst) or growth in your knee.  An infection in your knee.  An injury that will not heal.  Damage, swelling, or irritation of the tissues that support your knee.  Knee pain is usually not a sign of a serious problem. The pain may go away on its own with time and rest. If it does not, a health care provider may order tests to find the cause of the pain. These may include:  Imaging tests, such as an X-ray, MRI, or ultrasound.  Joint aspiration. In this test, fluid is removed from the knee.  Arthroscopy. In this test, a lighted tube is inserted into knee and an image is projected onto a TV screen.  A biopsy. In this test, a sample of tissue is removed from the body and studied under a microscope.  Follow these instructions at home: Pay attention to any changes in your symptoms. Take these actions to relieve your pain. Activity  Rest your knee.  Do not do things that cause pain or make pain worse.  Avoid high-impact activities or exercises, such as running, jumping rope, or doing jumping jacks. General instructions  Take over-the-counter and prescription medicines only as told by your health care provider.  Raise (elevate) your knee above the level of your heart when you are sitting or lying down.  Sleep with a pillow under your knee.  If directed, apply ice to the knee: ? Put ice in a plastic bag. ? Place a towel between your skin and the bag. ? Leave the ice on for 20 minutes,  2-3 times a day.  Ask your health care provider if you should wear an elastic knee support.  Lose weight if you are overweight. Extra weight can put pressure on your knee.  Do not use any products that contain nicotine or tobacco, such as cigarettes and e-cigarettes. Smoking may slow the healing of any bone and joint problems that you may have. If you need help quitting, ask your health care provider. Contact a health care provider if:  Your knee pain continues, changes, or gets worse.  You have a fever along with knee pain.  Your knee buckles or locks up.  Your knee swells, and the swelling becomes worse. Get help right away if:  Your knee feels warm to the touch.  You cannot move your knee.  You have severe pain in your knee.  You have chest pain.  You have trouble breathing. Summary  Knee pain in adults is common. It can be caused by many things, including, arthritis, infection, cysts, or injury.  Knee pain is usually not a sign of a serious problem, but if it does not go away, a health care provider may perform tests to know the cause of the pain.  Pay attention to any changes in your symptoms. Relieve your pain with rest, medicines, light activity, and use of ice.  Get help if your pain continues  or becomes very severe, or if your knee buckles or locks up, or if you have chest pain or trouble breathing. This information is not intended to replace advice given to you by your health care provider. Make sure you discuss any questions you have with your health care provider. Document Released: 04/27/2007 Document Revised: 06/20/2016 Document Reviewed: 06/20/2016 Elsevier Interactive Patient Education  2018 [employer]   IF you received an x-ray today, you will receive an invoice from [facility]. Please contact [facility] at [phone] with questions or concerns regarding your invoice.   IF you received labwork today, you will receive an invoice  from [facility]. Please contact [facility] at [phone] with questions or concerns regarding your invoice.   Our billing staff will not be able to assist you with questions regarding bills from these companies.  You will be contacted with the lab results as soon as they are available. The fastest way to get your results is to activate your My Chart account. Instructions are located on the last page of this paperwork. If you have not heard from us regarding the results in 2 weeks, please contact this office.

## 2018-01-07 ENCOUNTER — Telehealth: Payer: Self-pay | Admitting: Family Medicine

## 2018-01-07 DIAGNOSIS — M25562 Pain in left knee: Secondary | ICD-10-CM

## 2018-01-07 NOTE — Telephone Encounter (Signed)
Copied from CRM [phone]. Topic: Inquiry >> Jan 07, 2018 10:27 AM Maia Pettiesrtiz, Kristie S wrote: Reason for CRM: pt called and stated the cortisone shot only helped for a week. She is wanting to proceed with an MRI on L knee somewhere in [place] (maybe [facility]). Please call to advise.

## 2018-01-07 NOTE — Telephone Encounter (Signed)
Message re: pt wants MRI on knee before July 1 - deductible starts over... Sent to Dr. Neva SeatGreene

## 2018-01-07 NOTE — Telephone Encounter (Signed)
Patient call back and stated that she forgot to mention that her deductible will start over on January 11, 2018.  Therefore, she would like to get an MRI at any location before that date.  Please call and let her know as soon as possible.

## 2018-01-08 ENCOUNTER — Ambulatory Visit (INDEPENDENT_AMBULATORY_CARE_PROVIDER_SITE_OTHER): Payer: BLUE CROSS/BLUE SHIELD

## 2018-01-08 ENCOUNTER — Other Ambulatory Visit: Payer: Self-pay

## 2018-01-08 DIAGNOSIS — N926 Irregular menstruation, unspecified: Secondary | ICD-10-CM

## 2018-01-08 LAB — POCT URINE PREGNANCY: PREG TEST UR: NEGATIVE

## 2018-01-08 NOTE — Telephone Encounter (Signed)
Spoke with pt and advised that appt is on 01/09/18 at 1:00pm with arrival time of 12:30pm. [facility]uler [facility] asked if there is a possibility pt is pregnant. I called pt to see if she was using birth control and she said she was not, but that there is no way she is pregnant. Called [facility] to advise them of this, and asked if we need to have pt come in for a pregnancy test. They said pt can sign a waiver with them if she believes she is not pregnant. Called pt but had to leave a message that she can do a pregnancy test with us, or if she feels comfortable, sign the waiver with [facility] tomorrow.

## 2018-01-08 NOTE — Telephone Encounter (Signed)
Spoke to pt.  Injection lasted 3 days and pain is worse.  Has limited motion with pain.  Wears her brace all the time as knee feels unstable.    Referrals is working on getting MRI before Monday and will give her a call.

## 2018-01-08 NOTE — Progress Notes (Signed)
hcg 

## 2018-01-08 NOTE — Telephone Encounter (Signed)
More info please. Did she not get relief with injection?  Still having mechanical symptoms?  If so I do not mind ordering MRI, but not sure of scheduling that it can be done by the first. I will place the order but please advise referrals of timing if she still wants MRI. Thanks.

## 2018-01-09 ENCOUNTER — Ambulatory Visit (HOSPITAL_COMMUNITY)
Admission: RE | Admit: 2018-01-09 | Discharge: 2018-01-09 | Disposition: A | Discharge status: Home / Self Care | Payer: BLUE CROSS/BLUE SHIELD | Source: Ambulatory Visit | Attending: Family Medicine | Admitting: Family Medicine

## 2018-01-09 DIAGNOSIS — S83402A Sprain of unspecified collateral ligament of left knee, initial encounter: Secondary | ICD-10-CM | POA: Insufficient documentation

## 2018-01-09 DIAGNOSIS — X58XXXA Exposure to other specified factors, initial encounter: Secondary | ICD-10-CM | POA: Diagnosis not present

## 2018-01-09 DIAGNOSIS — M25562 Pain in left knee: Secondary | ICD-10-CM | POA: Insufficient documentation

## 2018-01-09 DIAGNOSIS — M25462 Effusion, left knee: Secondary | ICD-10-CM | POA: Diagnosis not present

## 2018-01-09 DIAGNOSIS — R600 Localized edema: Secondary | ICD-10-CM | POA: Diagnosis not present

## 2018-01-11 NOTE — Telephone Encounter (Signed)
Pt would like results of this MRI as soon as possible please.

## 2018-01-13 ENCOUNTER — Telehealth: Payer: Self-pay | Admitting: Family Medicine

## 2018-01-13 NOTE — Telephone Encounter (Signed)
Copied from CRM [phone]. Topic: Quick Communication - Other Results >> Jan 13, 2018  1:06 PM Oneal GroutSebastian, Jennifer S wrote: Requesting MRI results

## 2018-01-14 NOTE — Telephone Encounter (Signed)
LMOVM Dr. Neva SeatGreene [facility]

## 2018-01-18 ENCOUNTER — Encounter: Payer: Self-pay | Admitting: Family Medicine

## 2018-01-18 DIAGNOSIS — S83207A Unspecified tear of unspecified meniscus, current injury, left knee, initial encounter: Secondary | ICD-10-CM

## 2018-01-18 DIAGNOSIS — S83422A Sprain of lateral collateral ligament of left knee, initial encounter: Secondary | ICD-10-CM

## 2018-01-18 DIAGNOSIS — M25562 Pain in left knee: Secondary | ICD-10-CM

## 2018-01-21 ENCOUNTER — Telehealth: Payer: Self-pay | Admitting: Family Medicine

## 2018-01-21 NOTE — Telephone Encounter (Signed)
Pt has called to check on the status of referral. [phone]#[phone]

## 2018-01-21 NOTE — Telephone Encounter (Signed)
Copied from CRM [phone]. Topic: General - Other >> Jan 21, 2018  4:21 PM Stephannie LiSimmons, Kemani Demarais L, NT wrote: Reason for CRM: [facility] and would like a demographic sheet faxed to   [phone] , they have received records for the patient  without one

## 2018-01-22 NOTE — Telephone Encounter (Signed)
Demographic sheet faxed.

## 2018-02-05 DIAGNOSIS — S83242A Other tear of medial meniscus, current injury, left knee, initial encounter: Secondary | ICD-10-CM | POA: Insufficient documentation

## 2018-02-05 DIAGNOSIS — S83422A Sprain of lateral collateral ligament of left knee, initial encounter: Secondary | ICD-10-CM | POA: Insufficient documentation

## 2018-02-05 DIAGNOSIS — M1712 Unilateral primary osteoarthritis, left knee: Secondary | ICD-10-CM | POA: Insufficient documentation

## 2018-05-14 IMAGING — US US OB TRANSVAGINAL
1 series · 13 of 28 positions shown · non-contrast
Comparison: Pelvic ultrasound performed 04/12/2008

CLINICAL DATA: Acute onset of left lower quadrant abdominal pain.
Initial encounter.

EXAM:
OBSTETRIC <14 WK US AND TRANSVAGINAL OB US
TECHNIQUE: Both transabdominal and transvaginal ultrasound examinations were
performed for complete evaluation of the gestation as well as the
maternal uterus, adnexal regions, and pelvic cul-de-sac.
Transvaginal technique was performed to assess early pregnancy.

[Series 1: us ob transvaginal · 0.19mm/px · 13 of 42 slices shown]
[im 2/42]
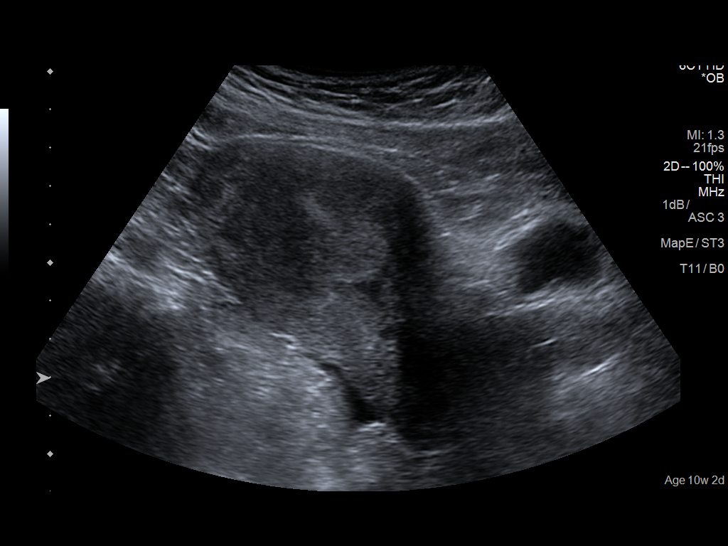
[im 5/42]
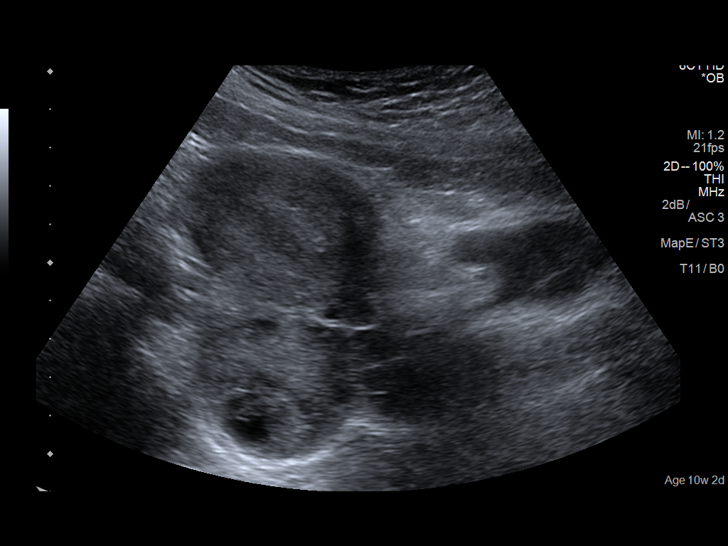
[im 8/42]
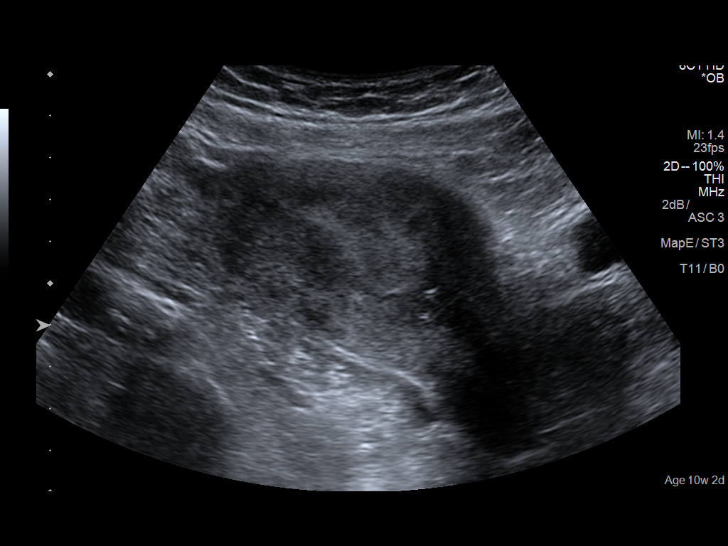
[im 11/42]
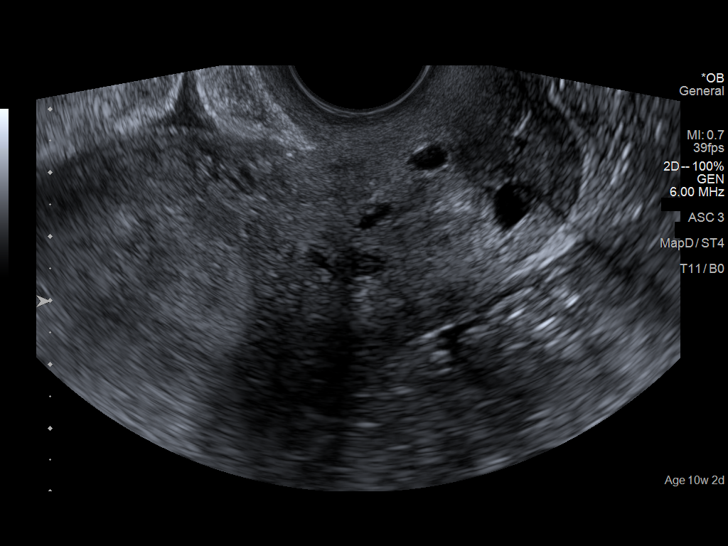
[im 14/42]
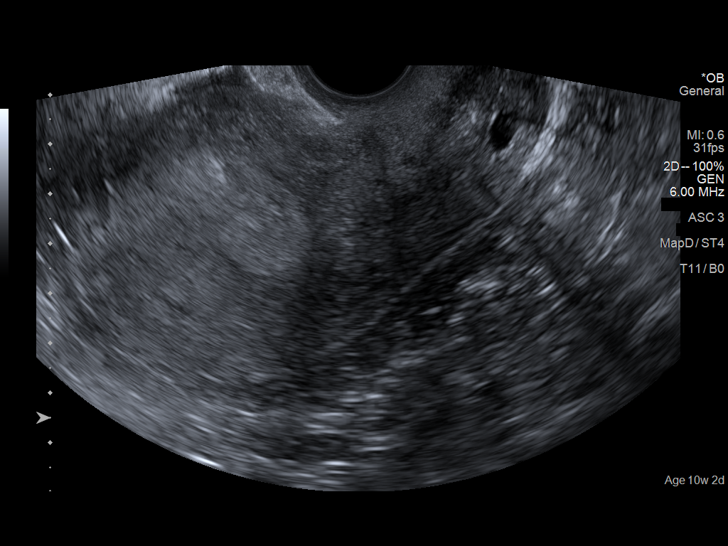
[im 17/42]
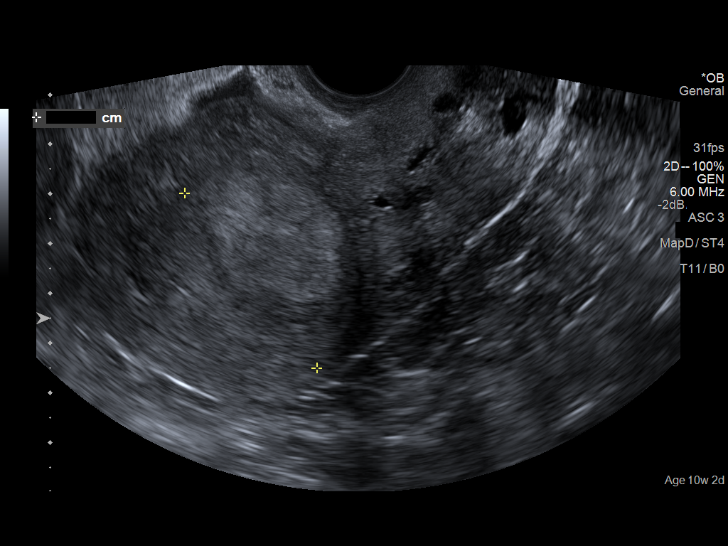
[im 22/42]
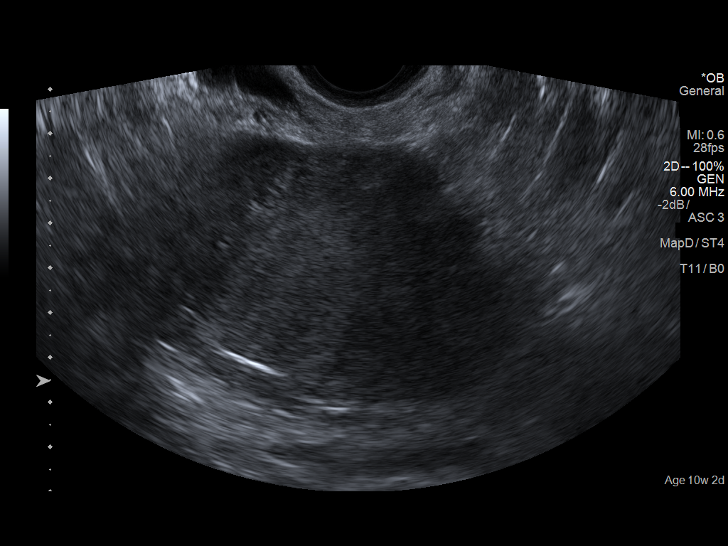
[im 25/42]
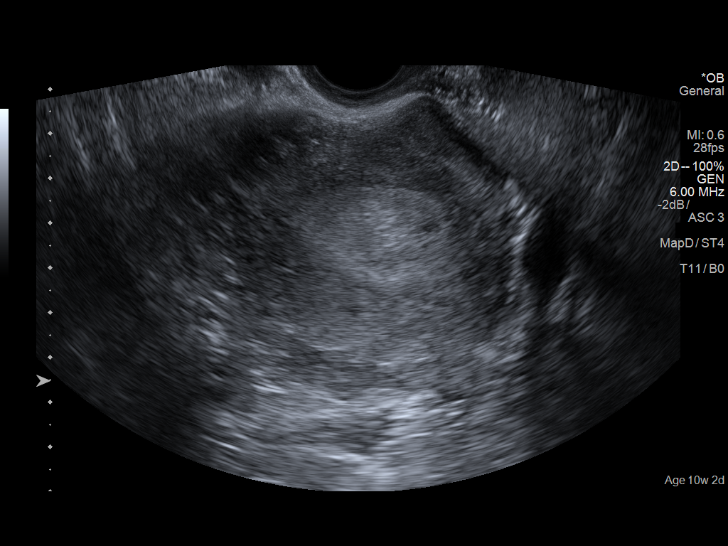
[im 28/42]
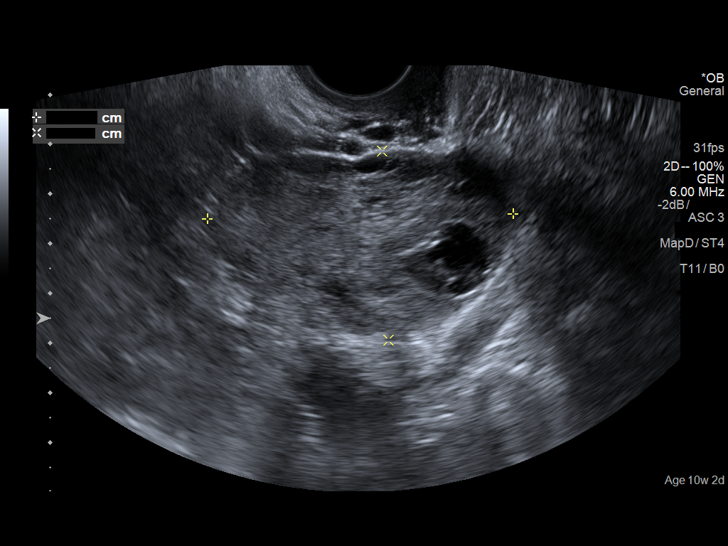
[im 31/42]
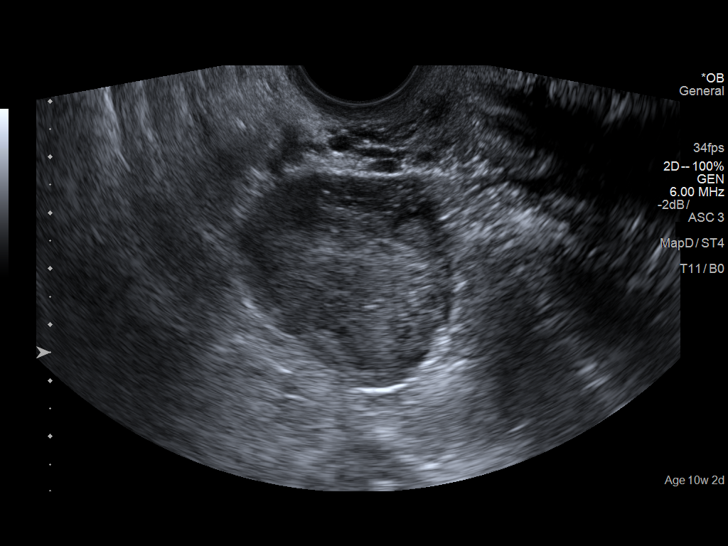
[im 34/42]
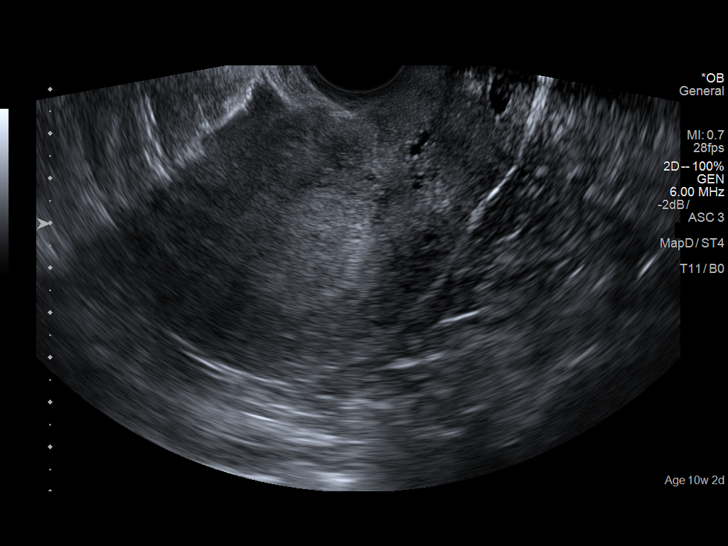
[im 37/42]
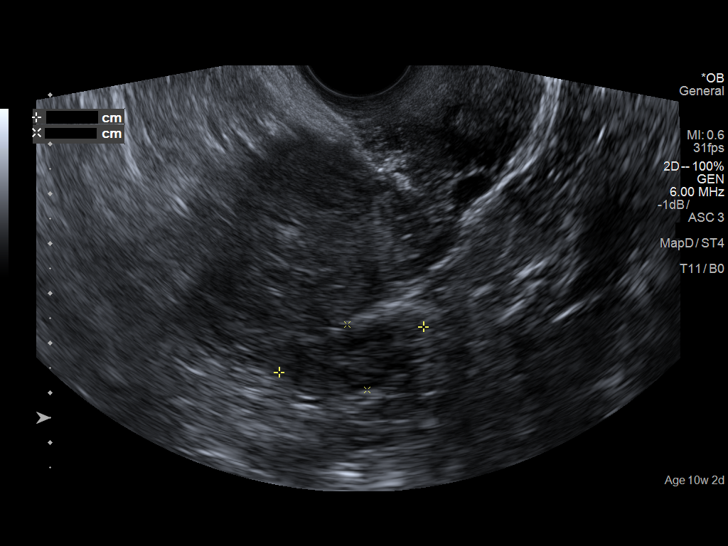
[im 40/42]
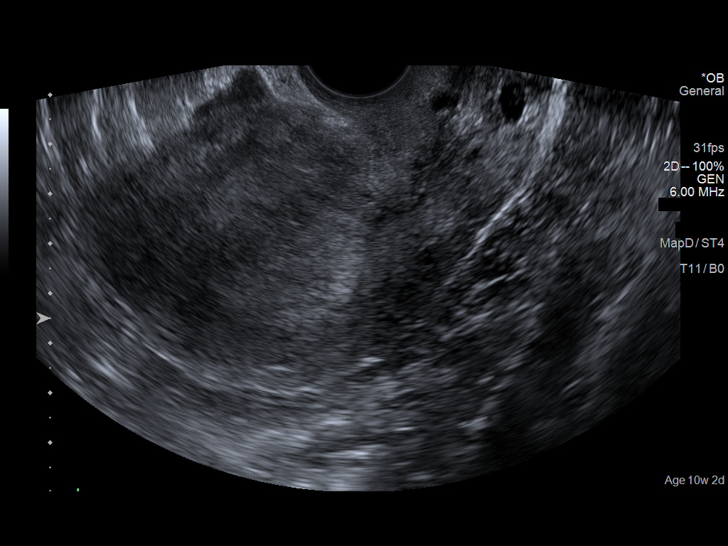

[13 of 28 positions shown; findings below may reference images not displayed]

FINDINGS: Intrauterine gestational sac: None seen.

Yolk sac:  N/A

Embryo:  N/A

Subchorionic hemorrhage:  None visualized.

Maternal uterus/adnexae: The endometrial echo complex is diffusely
thickened and mildly heterogeneous. Nabothian cysts are seen at the
cervix.

The ovaries are unremarkable in appearance. The right ovary measures
6.2 x 3.8 x 3.7 cm, while the left ovary measures 3.0 x 1.4 x
cm. No suspicious adnexal masses are seen; there is no evidence for
ovarian torsion. The asymmetrically enlarged right ovary is thought
to reflect a corpus luteal cyst, though the appearance of this cyst
is somewhat unusual. No definite ectopic pregnancy is seen.
IMPRESSION: No intrauterine gestational sac seen. No evidence for ectopic
pregnancy at this time. This remains within normal limits, given the
quantitative beta HCG of [DATE]. If the quantitative beta HCG level
continues to rise, follow-up pelvic ultrasound could be performed in
1-2 weeks.

## 2018-12-21 ENCOUNTER — Other Ambulatory Visit: Payer: Self-pay

## 2018-12-21 ENCOUNTER — Encounter: Payer: Self-pay | Admitting: Obstetrics & Gynecology

## 2018-12-21 ENCOUNTER — Ambulatory Visit (INDEPENDENT_AMBULATORY_CARE_PROVIDER_SITE_OTHER): Payer: Medicaid Other | Admitting: Obstetrics & Gynecology

## 2018-12-21 ENCOUNTER — Other Ambulatory Visit (HOSPITAL_COMMUNITY)
Admission: RE | Admit: 2018-12-21 | Discharge: 2018-12-21 | Disposition: A | Discharge status: Home / Self Care | Payer: Medicaid Other | Source: Ambulatory Visit | Attending: Obstetrics & Gynecology | Admitting: Obstetrics & Gynecology

## 2018-12-21 VITALS — BP 120/80 | Ht 63.0 in | Wt 235.0 lb

## 2018-12-21 DIAGNOSIS — Z01419 Encounter for gynecological examination (general) (routine) without abnormal findings: Secondary | ICD-10-CM

## 2018-12-21 DIAGNOSIS — Z87410 Personal history of cervical dysplasia: Secondary | ICD-10-CM | POA: Insufficient documentation

## 2018-12-21 DIAGNOSIS — Z0000 Encounter for general adult medical examination without abnormal findings: Secondary | ICD-10-CM | POA: Diagnosis not present

## 2018-12-21 DIAGNOSIS — Z30011 Encounter for initial prescription of contraceptive pills: Secondary | ICD-10-CM

## 2018-12-21 MED ORDER — NORGESTIMATE-ETH ESTRADIOL 0.25-35 MG-MCG PO TABS
1.0000 | ORAL_TABLET | Freq: Every day | ORAL | 11 refills | Status: DC
Start: 2018-12-21 — End: 2019-06-23

## 2018-12-21 NOTE — Patient Instructions (Signed)
Ethinyl Estradiol; Norgestimate tablets  What is this medicine?  ETHINYL ESTRADIOL; NORGESTIMATE (ETH in il es tra DYE ole; nor JES ti mate) is an oral contraceptive. The products combine two types of female hormones, an estrogen and a progestin. They are used to prevent ovulation and pregnancy. Some products are also used to treat acne in females.  This medicine may be used for other purposes; ask your health care provider or pharmacist if you have questions.  COMMON BRAND NAME(S): Estarylla, Mili, MONO-LINYAH, MonoNessa, Norgestimate/Ethinyl Estradiol, Ortho Tri-Cyclen, Ortho Tri-Cyclen Lo, Ortho-Cyclen, Previfem, Sprintec, Tri-Estarylla, TRI-LINYAH, Tri-Lo-Estarylla, Tri-Lo-Marzia, Tri-Lo-Mili, Tri-Lo-Sprintec, Tri-Mili, Tri-Previfem, Tri-Sprintec, Tri-VyLibra, Trinessa, Trinessa Lo, VyLibra  What should I tell my health care provider before I take this medicine?  They need to know if you have or ever had any of these conditions:  -abnormal vaginal bleeding  -blood vessel disease or blood clots  -breast, cervical, endometrial, ovarian, liver, or uterine cancer  -diabetes  -gallbladder disease  -heart disease or recent heart attack  -high blood pressure  -high cholesterol  -kidney disease  -liver disease  -migraine headaches  -stroke  -systemic lupus erythematosus (SLE)  -tobacco smoker  -an unusual or allergic reaction to estrogens, progestins, other medicines, foods, dyes, or preservatives  -pregnant or trying to get pregnant  -breast-feeding  How should I use this medicine?  Take this medicine by mouth. To reduce nausea, this medicine may be taken with food. Follow the directions on the prescription label. Take this medicine at the same time each day and in the order directed on the package. Do not take your medicine more often than directed.  Contact your pediatrician regarding the use of this medicine in children. Special care may be needed. This medicine has been used in female children who have started  having menstrual periods.  A patient package insert for the product will be given with each prescription and refill. Read this sheet carefully each time. The sheet may change frequently.  Overdosage: If you think you have taken too much of this medicine contact a poison control center or emergency room at once.  NOTE: This medicine is only for you. Do not share this medicine with others.  What if I miss a dose?  If you miss a dose, refer to the patient information sheet you received with your medicine for direction. If you miss more than one pill, this medicine may not be as effective and you may need to use another form of birth control.  What may interact with this medicine?  Do not take this medicine with the following medication:  -dasabuvir; ombitasvir; paritaprevir; ritonavir  -ombitasvir; paritaprevir; ritonavir  This medicine may also interact with the following medications:  -acetaminophen  -antibiotics or medicines for infections, especially rifampin, rifabutin, rifapentine, and griseofulvin, and possibly penicillins or tetracyclines  -aprepitant  -ascorbic acid (vitamin C)  -atorvastatin  -barbiturate medicines, such as phenobarbital  -bosentan  -carbamazepine  -caffeine  -clofibrate  -cyclosporine  -dantrolene  -doxercalciferol  -felbamate  -grapefruit juice  -hydrocortisone  -medicines for anxiety or sleeping problems, such as diazepam or temazepam  -medicines for diabetes, including pioglitazone  -mineral oil  -modafinil  -mycophenolate  -nefazodone  -oxcarbazepine  -phenytoin  -prednisolone  -ritonavir or other medicines for HIV infection or AIDS  -rosuvastatin  -selegiline  -soy isoflavones supplements  -St. John's wort  -tamoxifen or raloxifene  -theophylline  -thyroid hormones  -topiramate  -warfarin  This list may not describe all possible interactions. Give your health care provider a   list of all the medicines, herbs, non-prescription drugs, or dietary supplements you use. Also tell them if you  smoke, drink alcohol, or use illegal drugs. Some items may interact with your medicine.  What should I watch for while using this medicine?  Visit your doctor or health care professional for regular checks on your progress. You will need a regular breast and pelvic exam and Pap smear while on this medicine. You should also discuss the need for regular mammograms with your health care professional, and follow his or her guidelines for these tests.  This medicine can make your body retain fluid, making your fingers, hands, or ankles swell. Your blood pressure can go up. Contact your doctor or health care professional if you feel you are retaining fluid.  Use an additional method of contraception during the first cycle that you take these tablets.  If you have any reason to think you are pregnant, stop taking this medicine right away and contact your doctor or health care professional.  If you are taking this medicine for hormone related problems, it may take several cycles of use to see improvement in your condition.  Do not use this product if you smoke and are over 35 years of age. Smoking increases the risk of getting a blood clot or having a stroke while you are taking birth control pills, especially if you are more than 35 years old. If you are a smoker who is 35 years of age or younger, you are strongly advised not to smoke while taking birth control pills.  This medicine can make you more sensitive to the sun. Keep out of the sun. If you cannot avoid being in the sun, wear protective clothing and use sunscreen. Do not use sun lamps or tanning beds/booths.  If you wear contact lenses and notice visual changes, or if the lenses begin to feel uncomfortable, consult your eye care specialist.  In some women, tenderness, swelling, or minor bleeding of the gums may occur. Notify your dentist if this happens. Brushing and flossing your teeth regularly may help limit this. See your dentist regularly and inform your  dentist of the medicines you are taking.  If you are going to have elective surgery, you may need to stop taking this medicine before the surgery. Consult your health care professional for advice.  This medicine does not protect you against HIV infection (AIDS) or any other sexually transmitted diseases.  What side effects may I notice from receiving this medicine?  Side effects that you should report to your doctor or health care professional as soon as possible:  -breast tissue changes or discharge  -changes in vaginal bleeding during your period or between your periods  -chest pain  -coughing up blood  -dizziness or fainting spells  -headaches or migraines  -leg, arm or groin pain  -severe or sudden headaches  -stomach pain (severe)  -sudden shortness of breath  -sudden loss of coordination, especially on one side of the body  -speech problems  -symptoms of vaginal infection like itching, irritation or unusual discharge  -tenderness in the upper abdomen  -vomiting  -weakness or numbness in the arms or legs, especially on one side of the body  -yellowing of the eyes or skin  Side effects that usually do not require medical attention (report to your doctor or health care professional if they continue or are bothersome):  -breakthrough bleeding and spotting that continues beyond the 3 initial cycles of pills  -breast tenderness  -mood   changes, anxiety, depression, frustration, anger, or emotional outbursts  -increased sensitivity to sun or ultraviolet light  -nausea  -skin rash, acne, or brown spots on the skin  -weight gain (slight)  This list may not describe all possible side effects. Call your doctor for medical advice about side effects. You may report side effects to FDA at 1-800-FDA-1088.  Where should I keep my medicine?  Keep out of the reach of children.  Store at room temperature between 15 and 30 degrees C (59 and 86 degrees F). Throw away any unused medicine after the expiration date.  NOTE: This sheet  is a summary. It may not cover all possible information. If you have questions about this medicine, talk to your doctor, pharmacist, or health care provider.   2019 Elsevier/Gold Standard (2016-03-10 08:09:09)

## 2018-12-21 NOTE — Progress Notes (Signed)
HPI:      [name] is a 33 y.o. [phone] who LMP was Patient's last menstrual period was 12/03/2018., she presents today for her annual examination. The patient has no complaints today. The patient is sexually active. Her last pap: approximate date 2018 and was normal and prior LGSIL. The patient does perform self breast exams.  There is notable family history of breast or ovarian cancer in her family.  The patient has regular exercise: yes.  The patient denies current symptoms of depression.    GYN History: Contraception: none  PMHx: Past Medical History:  Diagnosis Date  . Type AB blood, Rh negative    Past Surgical History:  Procedure Laterality Date  . CHOLECYSTECTOMY    . GALLBLADDER SURGERY  7-06   Family History  Problem Relation Age of Onset  . Suicidality Father        2011  . Diabetes Maternal Grandfather    Social History   Tobacco Use  . Smoking status: Never Smoker  . Smokeless tobacco: Never Used  Substance Use Topics  . Alcohol use: Yes    Comment: SOCIALLY  . Drug use: No    Current Outpatient Medications:  .  acetaminophen (TYLENOL) 500 MG tablet, Take 1,000 mg by mouth every 6 (six) hours as needed for mild pain or headache., Disp: , Rfl:  .  ibuprofen (ADVIL,MOTRIN) 600 MG tablet, Take 1 tablet (600 mg total) by mouth every 6 (six) hours., Disp: 30 tablet, Rfl: 1 .  naproxen (NAPROSYN) 500 MG tablet, Take 1 tablet (500 mg total) by mouth 2 (two) times daily with a meal., Disp: 30 tablet, Rfl: 0 .  norgestimate-ethinyl estradiol (ORTHO-CYCLEN) 0.25-35 MG-MCG tablet, Take 1 tablet by mouth daily., Disp: 1 Package, Rfl: 11 Allergies: Latex  Review of Systems  Constitutional: Negative for chills, fever and malaise/fatigue.  HENT: Negative for congestion, sinus pain and sore throat.   Eyes: Negative for blurred vision and pain.  Respiratory: Negative for cough and wheezing.   Cardiovascular: Negative for chest pain and leg swelling.   Gastrointestinal: Negative for abdominal pain, constipation, diarrhea, heartburn, nausea and vomiting.  Genitourinary: Negative for dysuria, frequency, hematuria and urgency.  Musculoskeletal: Negative for back pain, joint pain, myalgias and neck pain.  Skin: Negative for itching and rash.  Neurological: Negative for dizziness, tremors and weakness.  Endo/Heme/Allergies: Does not bruise/bleed easily.  Psychiatric/Behavioral: Negative for depression. The patient is not nervous/anxious and does not have insomnia.     Objective: BP 120/80   Ht 5\' 3"  (1.6 m)   Wt 235 lb (106.6 kg)   LMP 12/03/2018   BMI 41.63 kg/m   Filed Weights   12/21/18 1453  Weight: 235 lb (106.6 kg)   Body mass index is 41.63 kg/m. Physical Exam Constitutional:      General: She is not in acute distress.    Appearance: She is well-developed.  Genitourinary:     Pelvic exam was performed with patient supine.     Vagina, uterus and rectum normal.     No lesions in the vagina.     No vaginal bleeding.     No cervical motion tenderness, friability, lesion or polyp.     Uterus is mobile.     Uterus is not enlarged.     No uterine mass detected.    Uterus is midaxial.     No right or left adnexal mass present.     Right adnexa not tender.  Left adnexa not tender.  HENT:     Head: Normocephalic and atraumatic. No laceration.     Right Ear: Hearing normal.     Left Ear: Hearing normal.     Mouth/Throat:     Pharynx: Uvula midline.  Eyes:     Pupils: Pupils are equal, round, and reactive to light.  Neck:     Musculoskeletal: Normal range of motion and neck supple.     Thyroid: No thyromegaly.  Cardiovascular:     Rate and Rhythm: Normal rate and regular rhythm.     Heart sounds: No murmur. No friction rub. No gallop.   Pulmonary:     Effort: Pulmonary effort is normal. No respiratory distress.     Breath sounds: Normal breath sounds. No wheezing.  Chest:     Breasts:        Right: No mass, skin  change or tenderness.        Left: No mass, skin change or tenderness.  Abdominal:     General: Bowel sounds are normal. There is no distension.     Palpations: Abdomen is soft.     Tenderness: There is no abdominal tenderness. There is no rebound.  Musculoskeletal: Normal range of motion.  Neurological:     Mental Status: She is alert and oriented to person, place, and time.     Cranial Nerves: No cranial nerve deficit.  Skin:    General: Skin is warm and dry.  Psychiatric:        Judgment: Judgment normal.  Vitals signs reviewed.     Assessment:  ANNUAL EXAM 1. Women's annual routine gynecological examination   2. Encounter for initial prescription of contraceptive pills   3. History of cervical dysplasia      Screening Plan:            1.  Cervical Screening-  Pap smear done today  2. Breast screening- Exam annually and mammogram>40 planned   3. Colonoscopy every 10 years, Hemoccult testing - after age 14  4. Labs managed by PCP  5. Counseling for contraception: oral contraceptives (estrogen/progesterone) - norgestimate-ethinyl estradiol (ORTHO-CYCLEN) 0.25-35 MG-MCG tablet; Take 1 tablet by mouth daily.  Dispense: 1 Package; Refill: 11 - desires reg cycles and plan for pregnancy within next 2 years    F/U  Return in about 1 year (around 12/21/2019) for Annual.  Barnett Applebaum, MD, [facility], [facility] 12/21/2018  3:22 PM

## 2018-12-24 LAB — CYTOLOGY - PAP
Adequacy: ABSENT
Chlamydia: NEGATIVE
Diagnosis: NEGATIVE
HPV: DETECTED — AB
Neisseria Gonorrhea: NEGATIVE

## 2018-12-27 NOTE — Progress Notes (Signed)
PAP is normal.   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  Barnett Applebaum, MD, [facility], [facility] 12/27/2018  7:14 AM

## 2018-12-28 ENCOUNTER — Encounter: Payer: Self-pay | Admitting: Obstetrics and Gynecology

## 2019-05-16 ENCOUNTER — Encounter: Payer: Self-pay | Admitting: Emergency Medicine

## 2019-05-16 ENCOUNTER — Other Ambulatory Visit: Payer: Self-pay

## 2019-05-16 ENCOUNTER — Ambulatory Visit: Payer: Medicaid Other

## 2019-05-16 ENCOUNTER — Ambulatory Visit
Admission: EM | Admit: 2019-05-16 | Discharge: 2019-05-16 | Disposition: A | Discharge status: Home / Self Care | Payer: Medicaid Other | Attending: Family Medicine | Admitting: Family Medicine

## 2019-05-16 DIAGNOSIS — M79672 Pain in left foot: Secondary | ICD-10-CM

## 2019-05-16 DIAGNOSIS — S93602A Unspecified sprain of left foot, initial encounter: Secondary | ICD-10-CM | POA: Insufficient documentation

## 2019-05-16 HISTORY — DX: Attention-deficit hyperactivity disorder, unspecified type: F90.9

## 2019-05-16 NOTE — ED Provider Notes (Signed)
[facility] ____________________________________________  Time seen: Approximately 1:11 PM  I have reviewed the triage vital signs and the nursing notes.   HISTORY  Chief Complaint Foot Injury (DOI 05/15/19 left)   HPI Paula Sullivan is a 33 y.o. female presenting for evaluation of left foot pain after injury that occurred yesterday.  Patient reports she was walking on the stairs with her 50-year-old and he started to fall, so in the attempt to quickly grab him she rolled her left foot.  Reports she did immediately have pain.  Reports she has continued remain ambulatory.  States pain and swelling is somewhat better today than it was yesterday.  Has used a compression sleeve and taken ibuprofen.  Denies other injuries, head injury, pain radiation or other complaints.  Reports otherwise doing well.    Past Medical History:  Diagnosis Date  . ADHD   . Type AB blood, Rh negative     Patient Active Problem List   Diagnosis Date Noted  . IUD check up 01/12/2017  . Headache 07/23/2016  . Lower abdominal pain 07/13/2016    Past Surgical History:  Procedure Laterality Date  . CHOLECYSTECTOMY    . GALLBLADDER SURGERY  7-06     No current facility-administered medications for this encounter.   Current Outpatient Medications:  .  amphetamine-dextroamphetamine (ADDERALL) 20 MG tablet, Take 40 mg by mouth daily., Disp: , Rfl:  .  acetaminophen (TYLENOL) 500 MG tablet, Take 1,000 mg by mouth every 6 (six) hours as needed for mild pain or headache., Disp: , Rfl:  .  ibuprofen (ADVIL,MOTRIN) 600 MG tablet, Take 1 tablet (600 mg total) by mouth every 6 (six) hours., Disp: 30 tablet, Rfl: 1 .  naproxen (NAPROSYN) 500 MG tablet, Take 1 tablet (500 mg total) by mouth 2 (two) times daily with a meal., Disp: 30 tablet, Rfl: 0 .  norgestimate-ethinyl estradiol (ORTHO-CYCLEN) 0.25-35 MG-MCG tablet, Take 1 tablet by mouth daily., Disp: 1 Package, Rfl: 11  Allergies Latex  Family  History  Problem Relation Age of Onset  . Suicidality Father        2011  . Diabetes Maternal Grandfather   . Cancer Mother 79       hyst--? cervical    Social History Social History   Tobacco Use  . Smoking status: Never Smoker  . Smokeless tobacco: Never Used  Substance Use Topics  . Alcohol use: Yes    Comment: SOCIALLY  . Drug use: No    Review of Systems Constitutional: No fever Cardiovascular: Denies chest pain. Respiratory: Denies shortness of breath. Musculoskeletal: Positive left foot pain. Skin: Negative for rash.   ____________________________________________   PHYSICAL EXAM:  VITAL SIGNS: ED Triage Vitals [05/16/19 1233]  Enc Vitals Group     BP 125/89     Pulse Rate 77     Resp 16     Temp 98.4 F (36.9 C)     Temp Source Oral     SpO2 99 %     Weight 220 lb (99.8 kg)     Height 5\' 3"  (1.6 m)     Head Circumference      Peak Flow      Pain Score 8     Pain Loc      Pain Edu?      Excl. in GC?     Constitutional: Alert and oriented. Well appearing and in no acute distress. Eyes: Conjunctivae are normal.  ENT      Head:  Normocephalic and atraumatic. Cardiovascular: Normal rate, regular rhythm. Grossly normal heart sounds.  Good peripheral circulation. Respiratory: Normal respiratory effort without tachypnea nor retractions. Breath sounds are clear and equal bilaterally. No wheezes, rales, rhonchi. Musculoskeletal: Steady gait.  Left foot distal dorsalis pedis and posterior tibialis pulses equal and strong.  Normal distal sensation and capillary refill to left foot.  Left foot lateral mid dorsal fourth and fifth to proximal fourth and fifth metatarsal tenderness to direct palpation with mild localized swelling, no malleolus tenderness, no ankle tenderness, able to fully plantarflex and dorsiflex as well as ankle rotate.  Left lower extremity otherwise nontender. Neurologic:  Normal speech and language. Speech is normal. No gait instability.   Skin:  Skin is warm, dry and intact. No rash noted. Psychiatric: Mood and affect are normal. Speech and behavior are normal. Patient exhibits appropriate insight and judgment   ___________________________________________   LABS (all labs ordered are listed, but only abnormal results are displayed)  Labs Reviewed - No data to display ____________________________________________  RADIOLOGY  Dg Foot Complete Left  Result Date: 05/16/2019 CLINICAL DATA:  Left foot pain after falling down 5 steps yesterday, primarily in the dorsal aspect of the foot in the region of the 4th and 5th metatarsals. EXAM: LEFT FOOT - COMPLETE 3+ VIEW COMPARISON:  None. FINDINGS: Mild inferior calcaneal enthesophyte formation. No fracture or dislocation seen. IMPRESSION: No fracture or dislocation. Electronically Signed   By: Claudie Revering M.D.   On: 05/16/2019 12:47   ____________________________________________   PROCEDURES Procedures    INITIAL IMPRESSION / ASSESSMENT AND PLAN / ED COURSE  Pertinent labs & imaging results that were available during my care of the patient were reviewed by me and considered in my medical decision making (see chart for details).  Well-appearing patient.  Left foot pain post mechanical injury that occurred yesterday.  Left foot x-ray as above per radiologist, reviewed, negative.  Suspect sprain injury.  Continue compression wrap and supportive care, ice and elevate over-the-counter ibuprofen as needed.  Declined need for orthopedic device from clinic.  Follow-up with orthopedic for continued pain.  Discussed follow up and return parameters including no resolution or any worsening concerns. Patient verbalized understanding and agreed to plan.   ____________________________________________   FINAL CLINICAL IMPRESSION(S) / ED DIAGNOSES  Final diagnoses:  Foot sprain, left, initial encounter     ED Discharge Orders    None       Note: This dictation was prepared with  Dragon dictation along with smaller phrase technology. Any transcriptional errors that result from this process are unintentional.         Marylene Land, NP 05/16/19 1321

## 2019-05-16 NOTE — ED Triage Notes (Signed)
Patient in today c/o left foot injury on 05/15/19. Patient twisted her foot and fell down a flight of stairs.

## 2019-06-23 ENCOUNTER — Other Ambulatory Visit: Payer: Self-pay

## 2019-06-23 ENCOUNTER — Encounter: Payer: Self-pay | Admitting: Emergency Medicine

## 2019-06-23 ENCOUNTER — Ambulatory Visit
Admission: EM | Admit: 2019-06-23 | Discharge: 2019-06-23 | Disposition: A | Discharge status: Home / Self Care | Payer: Medicaid Other | Attending: Urgent Care | Admitting: Urgent Care

## 2019-06-23 DIAGNOSIS — F909 Attention-deficit hyperactivity disorder, unspecified type: Secondary | ICD-10-CM | POA: Diagnosis not present

## 2019-06-23 DIAGNOSIS — R05 Cough: Secondary | ICD-10-CM | POA: Diagnosis not present

## 2019-06-23 DIAGNOSIS — R5383 Other fatigue: Secondary | ICD-10-CM

## 2019-06-23 DIAGNOSIS — B349 Viral infection, unspecified: Secondary | ICD-10-CM | POA: Diagnosis not present

## 2019-06-23 DIAGNOSIS — U071 COVID-19: Secondary | ICD-10-CM | POA: Insufficient documentation

## 2019-06-23 DIAGNOSIS — R519 Headache, unspecified: Secondary | ICD-10-CM

## 2019-06-23 DIAGNOSIS — Z7189 Other specified counseling: Secondary | ICD-10-CM | POA: Diagnosis present

## 2019-06-23 DIAGNOSIS — R059 Cough, unspecified: Secondary | ICD-10-CM

## 2019-06-23 DIAGNOSIS — Z79899 Other long term (current) drug therapy: Secondary | ICD-10-CM | POA: Insufficient documentation

## 2019-06-23 DIAGNOSIS — R0602 Shortness of breath: Secondary | ICD-10-CM

## 2019-06-23 MED ORDER — HYDROCOD POLST-CPM POLST ER 10-8 MG/5ML PO SUER: 5.0000 mL | Freq: Every evening | ORAL | 0 refills | Status: DC | PRN

## 2019-06-23 NOTE — ED Triage Notes (Signed)
Patient in today after having a +covid exposure at work. Patient started with fatigue x 2 weeks, headache x 1 week, cough x 2 days and sob with exertion x2 days.

## 2019-06-23 NOTE — ED Provider Notes (Signed)
[facility] ____________________________________________  Time seen: Approximately 1:37 PM  I have reviewed the triage vital signs and the nursing notes.   HISTORY  Chief Complaint +covid exposure (APPT), Fatigue, Headache, and Shortness of Breath  HPI  Paula Sullivan is a 33 y.o. female presenting for 2 days of cough and intermittent exertional shortness of breath.  Patient reports has also been having intermittent headaches.  Reports for the last 2 weeks she has felt more tired, but she further reports this is not too typical with her menstrual cycles.  Reports often feels fatigued with her menstrual cycles.  Denies any current chest pain or shortness of breath.  States she feels winded when she is doing a lot of activity.  States cough is a hacking cough.  Did disrupt sleep last night.  Denies known fevers.  Some throat discomfort, but denies actual sore throat.  Some nasal congestion.  Denies vomiting, diarrhea, changes in taste or smell.  Reports multiple direct COVID-19 positive exposures at her work.  Reports she had multiple coworkers that were asymptomatic and more at work.  No over-the-counter medication taken just prior to arrival.  Denies recent sickness.  Reports otherwise doing well.   Patient's last menstrual period was 06/19/2019 (approximate). Denies pregnancy.   Delia Chimes, NP (Inactive) : PCP  Past Medical History:  Diagnosis Date  . ADHD   . Type AB blood, Rh negative     Patient Active Problem List   Diagnosis Date Noted  . IUD check up 01/12/2017  . Headache 07/23/2016  . Lower abdominal pain 07/13/2016    Past Surgical History:  Procedure Laterality Date  . CHOLECYSTECTOMY    . GALLBLADDER SURGERY  7-06     No current facility-administered medications for this encounter.  Current Outpatient Medications:  .  acetaminophen (TYLENOL) 500 MG tablet, Take 1,000 mg by mouth every 6 (six) hours as needed for mild pain or headache., Disp: ,  Rfl:  .  amphetamine-dextroamphetamine (ADDERALL) 20 MG tablet, Take 40 mg by mouth daily., Disp: , Rfl:  .  ibuprofen (ADVIL,MOTRIN) 600 MG tablet, Take 1 tablet (600 mg total) by mouth every 6 (six) hours., Disp: 30 tablet, Rfl: 1 .  chlorpheniramine-HYDROcodone (TUSSIONEX PENNKINETIC ER) 10-8 MG/5ML SUER, Take 5 mLs by mouth at bedtime as needed for cough. do not drive or operate machinery while taking as can cause drowsiness., Disp: 40 mL, Rfl: 0  Allergies Latex  Family History  Problem Relation Age of Onset  . Suicidality Father        2011  . Diabetes Maternal Grandfather   . Cancer Mother 73       hyst--? cervical    Social History Social History   Tobacco Use  . Smoking status: Never Smoker  . Smokeless tobacco: Never Used  Substance Use Topics  . Alcohol use: Yes    Comment: SOCIALLY  . Drug use: No    Review of Systems Constitutional: Denies known fevers. ENT: No sore throat.  Positive congestion. Cardiovascular: Denies chest pain. Respiratory: Denies current shortness of breath. As above.  Gastrointestinal: No abdominal pain.  No nausea, no vomiting.  No diarrhea.  Genitourinary: Negative for dysuria. Musculoskeletal: Negative for back pain. Skin: Negative for rash.   ____________________________________________   PHYSICAL EXAM:  VITAL SIGNS: ED Triage Vitals  Enc Vitals Group     BP 06/23/19 1249 125/82     Pulse Rate 06/23/19 1249 98     Resp 06/23/19 1249 18  Temp 06/23/19 1249 98.1 F (36.7 C)     Temp Source 06/23/19 1249 Oral     SpO2 06/23/19 1249 100 %     Weight 06/23/19 1250 220 lb (99.8 kg)     Height 06/23/19 1250 5\' 3"  (1.6 m)     Head Circumference --      Peak Flow --      Pain Score --      Pain Loc --      Pain Edu? --      Excl. in GC? --     Constitutional: Alert and oriented. Well appearing and in no acute distress. Eyes: Conjunctivae are normal.  Head: Atraumatic. No sinus tenderness to palpation. No swelling. No  erythema.  Ears: no erythema, normal TMs bilaterally.   Nose:Mild nasal congestion.  Mouth/Throat: Mucous membranes are moist. No pharyngeal erythema. No tonsillar swelling or exudate.   Neck: No stridor.  No cervical spine tenderness to palpation. Hematological/Lymphatic/Immunilogical: No cervical lymphadenopathy. Cardiovascular: Normal rate, regular rhythm. Grossly normal heart sounds. Good peripheral circulation. Respiratory: Normal respiratory effort.  No retractions. No wheezes, rales or rhonchi. Good air movement.  Musculoskeletal: Ambulatory with steady gait.  Neurologic:  Normal speech and language. No gait instability. Skin:  Skin appears warm, dry and intact. No rash noted. Psychiatric: Mood and affect are normal. Speech and behavior are normal.  ___________________________________________   LABS (all labs ordered are listed, but only abnormal results are displayed)  Labs Reviewed  NOVEL CORONAVIRUS, NAA (HOSP ORDER, SEND-OUT TO REF LAB; TAT 18-24 HRS)   ____________________________________________  PROCEDURES Procedures    INITIAL IMPRESSION / ASSESSMENT AND PLAN / ED COURSE  Pertinent labs & imaging results that were available during my care of the patient were reviewed by me and considered in my medical decision making (see chart for details).  Very well-appearing patient.  No acute distress.  Suspect viral illness.  Lungs clear throughout.  Suspect patient fatigued with combination of recent menstrual cycle as well as current complaints.  COVID-19 testing completed and advice given.  Encourage rest, fluids, supportive care.  Patient she will take over-the-counter cough congestion medications during the day as needed.  As needed Tussionex at night.  Work note given, recommend following 10-day will.  Discussed follow up with Primary care physician this week as needed. Discussed follow up and return parameters including no resolution or any worsening concerns. Patient  verbalized understanding and agreed to plan.   ____________________________________________   FINAL CLINICAL IMPRESSION(S) / ED DIAGNOSES  Final diagnoses:  Viral illness  Cough  Advice given about COVID-19 virus infection     ED Discharge Orders         Ordered    chlorpheniramine-HYDROcodone (TUSSIONEX PENNKINETIC ER) 10-8 MG/5ML SUER  At bedtime PRN     06/23/19 1331           Note: This dictation was prepared with Dragon dictation along with smaller phrase technology. Any transcriptional errors that result from this process are unintentional.         14/10/20, NP 06/23/19 1349

## 2019-06-23 NOTE — Discharge Instructions (Signed)
Take medication as prescribed. Rest. Drink plenty of fluids. Over the counter medication as needed.  ° °Follow up with your primary care physician this week as needed. Return to Urgent care for new or worsening concerns.  ° °

## 2019-06-25 LAB — NOVEL CORONAVIRUS, NAA (HOSP ORDER, SEND-OUT TO REF LAB; TAT 18-24 HRS): SARS-CoV-2, NAA: DETECTED — AB

## 2019-06-27 ENCOUNTER — Telehealth: Payer: Self-pay | Admitting: Emergency Medicine

## 2019-06-27 NOTE — Telephone Encounter (Signed)
Your test for COVID-19 was positive, meaning that you were infected with the novel coronavirus and could give the germ to others.  Please continue isolation at home for at least 10 days since the start of your symptoms. If you do not have symptoms, please isolate at home for 10 days from the day you were tested. Once you complete your 10 day quarantine, you may return to normal activities as long as you've not had a fever for over 24 hours(without taking fever reducing medicine) and your symptoms are improving. Please continue good preventive care measures, including:  frequent hand-washing, avoid touching your face, cover coughs/sneezes, stay out of crowds and keep a 6 foot distance from others.  Go to the nearest hospital emergency room if fever/cough/breathlessness are severe or illness seems like a threat to life.  Patient contacted by phone and made aware of    results. Pt verbalized understanding and had all questions answered. Work note sent.

## 2019-07-06 ENCOUNTER — Ambulatory Visit: Payer: Medicaid Other | Admitting: Obstetrics & Gynecology

## 2019-11-17 NOTE — Progress Notes (Signed)
Tomi Bamberger, NP (Inactive)   Chief Complaint  Patient presents with  . Pelvic Pain    swelling and pain over pelvic bone, painful during intercourse, blood when wipes after intercourse  . Urinary Tract Infection    discomfort while urinating, no frequency    HPI:      [name] is a 34 y.o. [id number] whose LMP was Patient's last menstrual period was 11/06/2019 (approximate)., presents today for a few issues. Pt had vaginal and pelvic pain with sex recently and sx persisted for about 3 days after. Pain was achy, dull, throbbing. Had scant amt vaginal bleeding with wiping but had also finished her period. Never had this before. Sx have improved but not resolved. Denies any vag sx except felt swollen vaginally. Also has had urinary frequency/urgency and vaginal pain with urination since. No LBP, fevers, hematuria.   Menses are monthly, last 5-7 days, no BTB, mild dysmen. She is sex active, sometimes using condoms. Has new partner. Doesn't want pregnancy currently. Was on OCPs in past with side effects. Did well with OTC lo in past and would like to restart.   Pt complains of wt gain with swelling of legs in past 6-8 months. Exercises for 1 1/2 hrs daily and does meal prep. No calorie counting. Used to be able to lose wt but can't now. Feels swollen and bloated abd. FH hypothyroidism. No recent labs.   Annual due 6/21. Has 16# increase since that appt.  Past Medical History:  Diagnosis Date  . ADHD   . Type AB blood, Rh negative     Past Surgical History:  Procedure Laterality Date  . CHOLECYSTECTOMY    . GALLBLADDER SURGERY  7-06    Family History  Problem Relation Age of Onset  . Suicidality Father        2011  . Diabetes Maternal Grandfather   . Cancer Mother 95       hyst--? cervical    Social History   Socioeconomic History  . Marital status: Single    Spouse name: Not on file  . Number of children: 1  . Years of education: Not on file  . Highest  education level: Not on file  Occupational History  . Not on file  Tobacco Use  . Smoking status: Never Smoker  . Smokeless tobacco: Never Used  Substance and Sexual Activity  . Alcohol use: Yes    Comment: SOCIALLY  . Drug use: No  . Sexual activity: Yes    Birth control/protection: None  Other Topics Concern  . Not on file  Social History Narrative  . Not on file   Social Determinants of Health   Financial Resource Strain:   . Difficulty of Paying Living Expenses:   Food Insecurity:   . Worried About Programme researcher, broadcasting/film/video in the Last Year:   . Barista in the Last Year:   Transportation Needs:   . Freight forwarder (Medical):   Marland Kitchen Lack of Transportation (Non-Medical):   Physical Activity:   . Days of Exercise per Week:   . Minutes of Exercise per Session:   Stress:   . Feeling of Stress :   Social Connections:   . Frequency of Communication with Friends and Family:   . Frequency of Social Gatherings with Friends and Family:   . Attends Religious Services:   . Active Member of Clubs or Organizations:   . Attends Banker Meetings:   .  Marital Status:   Intimate Partner Violence:   . Fear of Current or Ex-Partner:   . Emotionally Abused:   Marland Kitchen Physically Abused:   . Sexually Abused:     Outpatient Medications Prior to Visit  Medication Sig Dispense Refill  . acetaminophen (TYLENOL) 500 MG tablet Take 1,000 mg by mouth every 6 (six) hours as needed for mild pain or headache.    . amphetamine-dextroamphetamine (ADDERALL) 20 MG tablet Take 40 mg by mouth daily.    . chlorpheniramine-HYDROcodone (TUSSIONEX PENNKINETIC ER) 10-8 MG/5ML SUER Take 5 mLs by mouth at bedtime as needed for cough. do not drive or operate machinery while taking as can cause drowsiness. 40 mL 0  . ibuprofen (ADVIL,MOTRIN) 600 MG tablet Take 1 tablet (600 mg total) by mouth every 6 (six) hours. 30 tablet 1   No facility-administered medications prior to visit.      ROS:   Review of Systems  Constitutional: Positive for unexpected weight change. Negative for fatigue and fever.  Respiratory: Negative for cough, shortness of breath and wheezing.   Cardiovascular: Negative for chest pain, palpitations and leg swelling.  Gastrointestinal: Negative for blood in stool, constipation, diarrhea, nausea and vomiting.  Endocrine: Negative for cold intolerance, heat intolerance and polyuria.  Genitourinary: Positive for dyspareunia, frequency, pelvic pain, urgency, vaginal bleeding and vaginal pain. Negative for dysuria, flank pain, genital sores, hematuria, menstrual problem and vaginal discharge.  Musculoskeletal: Positive for arthralgias. Negative for back pain, joint swelling and myalgias.  Skin: Negative for rash.  Neurological: Negative for dizziness, syncope, light-headedness, numbness and headaches.  Hematological: Negative for adenopathy.  Psychiatric/Behavioral: Positive for agitation. Negative for confusion, sleep disturbance and suicidal ideas. The patient is not nervous/anxious.     OBJECTIVE:   Vitals:  BP 120/90   Ht 5\' 3"  (1.6 m)   Wt 251 lb (113.9 kg)   LMP 11/06/2019 (Approximate)   BMI 44.46 kg/m   Physical Exam Vitals reviewed.  Constitutional:      Appearance: She is well-developed.  Pulmonary:     Effort: Pulmonary effort is normal.  Abdominal:     Palpations: Abdomen is soft.     Tenderness: There is abdominal tenderness in the suprapubic area. There is no guarding or rebound.  Genitourinary:    General: Normal vulva.     Pubic Area: No rash.      Labia:        Right: No rash, tenderness or lesion.        Left: No rash, tenderness or lesion.      Vagina: Normal. No vaginal discharge, erythema or tenderness.     Cervix: Normal.     Uterus: Normal. Tender. Not enlarged.      Adnexa: Right adnexa normal and left adnexa normal.       Right: No mass or tenderness.         Left: No mass or tenderness.    Musculoskeletal:         General: Normal range of motion.     Cervical back: Normal range of motion.  Skin:    General: Skin is warm and dry.  Neurological:     General: No focal deficit present.     Mental Status: She is alert and oriented to person, place, and time.  Psychiatric:        Mood and Affect: Mood normal.        Behavior: Behavior normal.        Thought Content: Thought content normal.  Judgment: Judgment normal.     Results: Results for orders placed or performed in visit on 11/18/19 (from the past 24 hour(s))  POCT Urinalysis Dipstick     Status: Normal   Collection Time: 11/18/19 12:01 PM  Result Value Ref Range   Color, UA yellow    Clarity, UA clear    Glucose, UA Negative Negative   Bilirubin, UA neg    Ketones, UA neg    Spec Grav, UA 1.010 1.010 - 1.025   Blood, UA neg    pH, UA 5.0 5.0 - 8.0   Protein, UA Negative Negative   Urobilinogen, UA     Nitrite, UA neg    Leukocytes, UA Negative Negative   Appearance     Odor       Assessment/Plan: Pelvic pain--Neg UA, slightly tender on exam. Check C&S given urinary sx. Rule out STDs. If neg and sx persist, will check GYN u/s.   Urinary frequency - Plan: POCT Urinalysis Dipstick, Urine Culture  Screening for STD (sexually transmitted disease) - Plan: Cervicovaginal ancillary only  Encounter for initial prescription of contraceptive pills - Plan: Norgestimate-Ethinyl Estradiol Triphasic 0.18/0.215/0.25 MG-25 MCG tab; OCP start Sun. Condoms. Rx eRxd. F/u at upcoming annual.  Blood tests for routine general physical examination - Plan: TSH + free T4, Hemoglobin A1c, Comprehensive metabolic panel; Check labs for wt gain/swelling.   Thyroid disorder screening - Plan: TSH + free T4  Screening for diabetes mellitus - Plan: Hemoglobin A1c  Weight loss counseling--check labs. If neg, MyFitness Pal cal count/exercise changes. Will f/u with results.    Meds ordered this encounter  Medications  . Norgestimate-Ethinyl  Estradiol Triphasic 0.18/0.215/0.25 MG-25 MCG tab    Sig: Take 1 tablet by mouth daily.    Dispense:  3 Package    Refill:  0    Order Specific Question:   Supervising Provider    Answer:   Gae Dry [[id number]]      Return in about 2 months (around 08/18/3662) for annual.  Alicia B. Copland, PA-C 11/18/2019 12:04 PM

## 2019-11-18 ENCOUNTER — Ambulatory Visit: Payer: Medicaid Other | Admitting: Obstetrics and Gynecology

## 2019-11-18 ENCOUNTER — Ambulatory Visit (INDEPENDENT_AMBULATORY_CARE_PROVIDER_SITE_OTHER): Payer: Medicaid Other | Admitting: Obstetrics and Gynecology

## 2019-11-18 ENCOUNTER — Other Ambulatory Visit (HOSPITAL_COMMUNITY)
Admission: RE | Admit: 2019-11-18 | Discharge: 2019-11-18 | Disposition: A | Discharge status: Home / Self Care | Payer: Medicaid Other | Source: Ambulatory Visit | Attending: Obstetrics and Gynecology | Admitting: Obstetrics and Gynecology

## 2019-11-18 ENCOUNTER — Other Ambulatory Visit: Payer: Self-pay

## 2019-11-18 ENCOUNTER — Encounter: Payer: Self-pay | Admitting: Obstetrics and Gynecology

## 2019-11-18 VITALS — BP 120/90 | Ht 63.0 in | Wt 251.0 lb

## 2019-11-18 DIAGNOSIS — R350 Frequency of micturition: Secondary | ICD-10-CM

## 2019-11-18 DIAGNOSIS — Z131 Encounter for screening for diabetes mellitus: Secondary | ICD-10-CM

## 2019-11-18 DIAGNOSIS — Z113 Encounter for screening for infections with a predominantly sexual mode of transmission: Secondary | ICD-10-CM

## 2019-11-18 DIAGNOSIS — Z1329 Encounter for screening for other suspected endocrine disorder: Secondary | ICD-10-CM

## 2019-11-18 DIAGNOSIS — R102 Pelvic and perineal pain: Secondary | ICD-10-CM

## 2019-11-18 DIAGNOSIS — Z30011 Encounter for initial prescription of contraceptive pills: Secondary | ICD-10-CM

## 2019-11-18 DIAGNOSIS — Z0000 Encounter for general adult medical examination without abnormal findings: Secondary | ICD-10-CM

## 2019-11-18 DIAGNOSIS — Z713 Dietary counseling and surveillance: Secondary | ICD-10-CM

## 2019-11-18 LAB — POCT URINALYSIS DIPSTICK
Bilirubin, UA: NEGATIVE
Blood, UA: NEGATIVE
Glucose, UA: NEGATIVE
Ketones, UA: NEGATIVE
Leukocytes, UA: NEGATIVE
Nitrite, UA: NEGATIVE
Protein, UA: NEGATIVE
Spec Grav, UA: 1.01 (ref 1.010–1.025)
pH, UA: 5 (ref 5.0–8.0)

## 2019-11-18 MED ORDER — NORGESTIM-ETH ESTRAD TRIPHASIC 0.18/0.215/0.25 MG-25 MCG PO TABS: 1.0000 | ORAL_TABLET | Freq: Every day | ORAL | 0 refills | Status: DC

## 2019-11-18 NOTE — Patient Instructions (Signed)
I value your feedback and entrusting us with your care. If you get a  patient survey, I would appreciate you taking the time to let us know about your experience today. Thank you!  As of June 23, 2019, your lab results will be released to your MyChart immediately, before I even have a chance to see them. Please give me time to review them and contact you if there are any abnormalities. Thank you for your patience.  

## 2019-11-19 LAB — COMPREHENSIVE METABOLIC PANEL
ALT: 17 IU/L (ref 0–32)
AST: 18 IU/L (ref 0–40)
Albumin/Globulin Ratio: 1.6 (ref 1.2–2.2)
Albumin: 4.6 g/dL (ref 3.8–4.8)
Alkaline Phosphatase: 75 IU/L (ref 39–117)
BUN/Creatinine Ratio: 21 (ref 9–23)
BUN: 16 mg/dL (ref 6–20)
Bilirubin Total: 0.4 mg/dL (ref 0.0–1.2)
CO2: 23 mmol/L (ref 20–29)
Calcium: 9.4 mg/dL (ref 8.7–10.2)
Chloride: 101 mmol/L (ref 96–106)
Creatinine, Ser: 0.78 mg/dL (ref 0.57–1.00)
GFR calc Af Amer: 114 mL/min/{1.73_m2} (ref >59)
GFR calc non Af Amer: 99 mL/min/{1.73_m2} (ref >59)
Globulin, Total: 2.8 g/dL (ref 1.5–4.5)
Glucose: 90 mg/dL (ref 65–99)
Potassium: 4.4 mmol/L (ref 3.5–5.2)
Sodium: 140 mmol/L (ref 134–144)
Total Protein: 7.4 g/dL (ref 6.0–8.5)

## 2019-11-19 LAB — TSH+FREE T4
Free T4: 0.98 ng/dL (ref 0.82–1.77)
TSH: 2.19 u[IU]/mL (ref 0.450–4.500)

## 2019-11-19 LAB — HEMOGLOBIN A1C
Est. average glucose Bld gHb Est-mCnc: 94 mg/dL
Hgb A1c MFr Bld: 4.9 % (ref 4.8–5.6)

## 2019-11-20 LAB — URINE CULTURE

## 2019-11-21 ENCOUNTER — Other Ambulatory Visit: Payer: Self-pay | Admitting: Obstetrics and Gynecology

## 2019-11-21 ENCOUNTER — Other Ambulatory Visit: Payer: Self-pay

## 2019-11-21 ENCOUNTER — Ambulatory Visit (INDEPENDENT_AMBULATORY_CARE_PROVIDER_SITE_OTHER): Payer: Medicaid Other

## 2019-11-21 DIAGNOSIS — R102 Pelvic and perineal pain: Secondary | ICD-10-CM

## 2019-11-21 LAB — CERVICOVAGINAL ANCILLARY ONLY
Chlamydia: NEGATIVE
Comment: NEGATIVE
Comment: NEGATIVE
Comment: NORMAL
Neisseria Gonorrhea: NEGATIVE
Trichomonas: NEGATIVE

## 2019-11-21 NOTE — Addendum Note (Signed)
Addended by: Althea Grimmer B on: 11/21/2019 02:31 PM   Modules accepted: Orders

## 2019-11-21 NOTE — Addendum Note (Signed)
Addended by: Althea Grimmer B on: 11/21/2019 03:00 PM   Modules accepted: Orders

## 2019-11-22 ENCOUNTER — Telehealth: Payer: Self-pay | Admitting: Obstetrics and Gynecology

## 2019-11-22 NOTE — Telephone Encounter (Signed)
Pt aware of neg GYN u/s results for pelvic pain. Neg C&S, neg STD testing. Question MSK since also having LBP. Heat/ice, stretch, LBP exercises. Getting ready to start OCPs, too. F/u prn.

## 2019-12-22 ENCOUNTER — Ambulatory Visit: Payer: Medicaid Other | Admitting: Obstetrics and Gynecology

## 2020-01-05 ENCOUNTER — Telehealth: Payer: Self-pay

## 2020-01-05 NOTE — Telephone Encounter (Signed)
Confirmed and screened for 01-09-20 ov. 

## 2020-01-09 ENCOUNTER — Other Ambulatory Visit: Payer: Self-pay

## 2020-01-09 ENCOUNTER — Ambulatory Visit: Payer: Medicaid Other | Admitting: Nurse Practitioner

## 2020-01-09 ENCOUNTER — Encounter: Payer: Self-pay | Admitting: Nurse Practitioner

## 2020-01-09 VITALS — BP 122/77 | HR 78 | Temp 97.6°F | Resp 16 | Ht 63.0 in | Wt 253.4 lb

## 2020-01-09 DIAGNOSIS — R635 Abnormal weight gain: Secondary | ICD-10-CM

## 2020-01-09 DIAGNOSIS — F988 Other specified behavioral and emotional disorders with onset usually occurring in childhood and adolescence: Secondary | ICD-10-CM | POA: Diagnosis not present

## 2020-01-09 MED ORDER — AMPHETAMINE-DEXTROAMPHET ER 20 MG PO CP24: ORAL_CAPSULE | ORAL | 0 refills | Status: DC

## 2020-01-09 NOTE — Progress Notes (Signed)
[facility] [address] [place], [place] [address]  Internal MEDICINE  Office Visit Note  Patient Name: Paula Sullivan  [id number]  [id number]  Date of Service: 01/09/2020   Complaints/HPI Pt is here for establishment of PCP. Chief Complaint  Patient presents with  . New Patient (Initial Visit)    Would like to discuss weight and right knee pain   The patient is here to establish new primary care provider. She is coming from small PCP office in [place]. This is getting to be a long comment for her. Will have to take entire day off work for follow up appointment. She does go to [facility] for well woman exam. The patient states that she does have long term issue with adult ADD. She was on Adderall XR 30mg , two tablets daily. This was working well, then got changed at the pharmacy to IR 30mg , two tablets daily. States that onset was much too rapid with strong crash after that. She has been off this medication for a few months now and would like to restart some sort of medication for adult ADD. She is also concerned about recent weight gain, mostly since she has been off of adderall. She has started limiting her calorie intake to1200 calories per day. She exercises routinely. She also has a three year old boy who makes sure she is very active. She admits to improved stress levels at work.    Current Medication: Outpatient Encounter Medications as of 01/09/2020  Medication Sig  . [DISCONTINUED] amphetamine-dextroamphetamine (ADDERALL XR) 30 MG 24 hr capsule Take 30 mg by mouth daily.  amphetamine-dextroamphetamine (ADDERALL XR) 20 MG 24 hr capsule Take 1 t  2 capsules po daily.  . [DISCONTINUED] norgestimate-ethinyl estradiol (ORTHO-CYCLEN) 0.25-35 MG-MCG tablet Take 1 tablet by mouth daily.  . [DISCONTINUED] Norgestimate-Ethinyl Estradiol Triphasic 0.18/0.215/0.25 MG-25 MCG tab Take 1 tablet by mouth daily.   No facility-administered encounter medications on file  as of 01/09/2020.    Surgical History: Past Surgical History:  Procedure Laterality Date  . CHOLECYSTECTOMY    . GALLBLADDER SURGERY  7-06    Medical History: Past Medical History:  Diagnosis Date  . ADHD   . Type AB blood, Rh negative     Family History: Family History  Problem Relation Age of Onset  . Suicidality Father        2011  . Diabetes Maternal Grandfather   . Cancer Mother 36       hyst--? cervical    Social History   Socioeconomic History  . Marital status: Single    Spouse name: Not on file  . Number of children: 1  . Years of education: Not on file  . Highest education level: Not on file  Occupational History  . Not on file  Tobacco Use  . Smoking status: Never Smoker  . Smokeless tobacco: Never Used  Vaping Use  . Vaping Use: Never used  Substance and Sexual Activity  . Alcohol use: Yes    Comment: Rarely  . Drug use: No  . Sexual activity: Yes    Birth control/protection: None  Other Topics Concern  . Not on file  Social History Narrative  . Not on file   Social Determinants of Health   Financial Resource Strain:   . Difficulty of Paying Living Expenses:   Food Insecurity:   . Worried About 2012 in the Last Year:   . 30 of Food in the Last Year:  Transportation Needs:   . Freight forwarder (Medical):   Marland Kitchen Lack of Transportation (Non-Medical):   Physical Activity:   . Days of Exercise per Week:   . Minutes of Exercise per Session:   Stress:   . Feeling of Stress :   Social Connections:   . Frequency of Communication with Friends and Family:   . Frequency of Social Gatherings with Friends and Family:   . Attends Religious Services:   . Active Member of Clubs or Organizations:   . Attends Banker Meetings:   Marland Kitchen Marital Status:   Intimate Partner Violence:   . Fear of Current or Ex-Partner:   . Emotionally Abused:   Marland Kitchen Physically Abused:   . Sexually Abused:      Review of Systems   Constitutional: Negative for activity change, chills, fatigue and unexpected weight change.       Recent weight gain over the past few months.   HENT: Negative for congestion, postnasal drip, rhinorrhea, sneezing and sore throat.   Respiratory: Negative for cough, chest tightness, shortness of breath and wheezing.   Cardiovascular: Negative for chest pain and palpitations.  Gastrointestinal: Negative for abdominal pain, constipation, diarrhea, nausea and vomiting.  Endocrine: Negative for cold intolerance, heat intolerance, polydipsia and polyuria.       Most recent thyroid panel normal.  Musculoskeletal: Negative for arthralgias, back pain, joint swelling and neck pain.  Skin: Negative for rash.  Allergic/Immunologic: Negative for environmental allergies.  Neurological: Negative for dizziness, tremors, numbness and headaches.  Hematological: Negative for adenopathy. Does not bruise/bleed easily.  Psychiatric/Behavioral: Positive for decreased concentration. Negative for behavioral problems (Depression), sleep disturbance and suicidal ideas. The patient is not nervous/anxious.    Today's Vitals   01/09/20 0857  BP: 122/77  Pulse: 78  Resp: 16  Temp: 97.6 F (36.4 C)  SpO2: 97%  Weight: 253 lb 6.4 oz (114.9 kg)  Height: 5\' 3"  (1.6 m)   Body mass index is 44.89 kg/m.  Physical Exam Vitals and nursing note reviewed.  Constitutional:      General: She is not in acute distress.    Appearance: Normal appearance. She is well-developed. She is not diaphoretic.  HENT:     Head: Normocephalic and atraumatic.     Nose: Nose normal.     Mouth/Throat:     Pharynx: No oropharyngeal exudate.  Eyes:     Pupils: Pupils are equal, round, and reactive to light.  Neck:     Thyroid: No thyromegaly.     Vascular: No JVD.     Trachea: No tracheal deviation.  Cardiovascular:     Rate and Rhythm: Normal rate and regular rhythm.     Heart sounds: Normal heart sounds. No murmur heard.  No  friction rub. No gallop.   Pulmonary:     Effort: Pulmonary effort is normal. No respiratory distress.     Breath sounds: Normal breath sounds. No wheezing or rales.  Chest:     Chest wall: No tenderness.  Abdominal:     Palpations: Abdomen is soft.  Musculoskeletal:        General: Normal range of motion.     Cervical back: Normal range of motion and neck supple.  Lymphadenopathy:     Cervical: No cervical adenopathy.  Skin:    General: Skin is warm and dry.  Neurological:     Mental Status: She is alert and oriented to person, place, and time.     Cranial Nerves: No cranial  nerve deficit.  Psychiatric:        Mood and Affect: Mood normal.        Behavior: Behavior normal.        Thought Content: Thought content normal.        Judgment: Judgment normal.     Comments: Adult ADD self-assessment administered during the patient's visit today. She scores very high indicating strong correlation with Adult ADD.    Assessment/Plan: 1. Abnormal weight gain Unclear etiology. Most recent htyroid panel done 11/2019 and was normal. Restart Adderall XR and will monitor closely.   2. Attention deficit disorder (ADD) in adult Adult ADD self-assessment administered during the patient's visit today. She scores very high indicating strong correlation with Adult ADD.  Restart Adderall XR at 20mg , taking one to two capsules daily. Reassess in one month.  - amphetamine-dextroamphetamine (ADDERALL XR) 20 MG 24 hr capsule; Take 1 t  2 capsules po daily.  Dispense: 60 capsule; Refill: 0  General Counseling: Paula Sullivan verbalizes understanding of the findings of todays visit and agrees with plan of treatment. I have discussed any further diagnostic evaluation that may be needed or ordered today. We also reviewed her medications today. she has been encouraged to call the office with any questions or concerns that should arise related to todays visit.    Counseling:  This patient was seen by [place] with Dr Lavera Guise as a part of collaborative care agreement  Meds ordered this encounter  Medications  . amphetamine-dextroamphetamine (ADDERALL XR) 20 MG 24 hr capsule    Sig: Take 1 t  2 capsules po daily.    Dispense:  60 capsule    Refill:  0    Please note that prescription is for XR capsules. Thanks.    Order Specific Question:   Supervising Provider    Answer:   Lavera Guise [[id number]]    Time spent: 30 Minutes

## 2020-02-08 ENCOUNTER — Telehealth: Payer: Self-pay

## 2020-02-08 NOTE — Telephone Encounter (Signed)
Lmom to confirm and screen for 02-10-20 ov. 

## 2020-02-10 ENCOUNTER — Encounter: Payer: Self-pay | Admitting: Nurse Practitioner

## 2020-02-10 ENCOUNTER — Other Ambulatory Visit: Payer: Self-pay

## 2020-02-10 ENCOUNTER — Telehealth: Payer: Self-pay

## 2020-02-10 ENCOUNTER — Ambulatory Visit: Payer: Medicaid Other | Admitting: Nurse Practitioner

## 2020-02-10 VITALS — BP 118/80 | HR 93 | Temp 98.1°F | Resp 16 | Ht 63.0 in | Wt 251.0 lb

## 2020-02-10 DIAGNOSIS — F988 Other specified behavioral and emotional disorders with onset usually occurring in childhood and adolescence: Secondary | ICD-10-CM

## 2020-02-10 DIAGNOSIS — R635 Abnormal weight gain: Secondary | ICD-10-CM

## 2020-02-10 MED ORDER — AMPHETAMINE-DEXTROAMPHET ER 20 MG PO CP24: ORAL_CAPSULE | ORAL | 0 refills | Status: DC

## 2020-02-10 NOTE — Progress Notes (Signed)
[facility] [address] [place], [place] [address]  Internal MEDICINE  Office Visit Note  Patient Name: Paula Sullivan  [id number]  [id number]  Date of Service: 03/04/2020  Chief Complaint  Patient presents with  . Follow-up    Still concerned about weight, not losing lbs    The patient is here for routine follow up. She was restarted on adderall XR 20mg  capsules. She is taking one to two capsules daily. She states that she has done well on this medication. The patient states that she is staying focused and on track while working. She reports no negative side effects from restarting on this medication. Her blood pressure is well controlled. She is concerned about slow weight loss. She is limiting her calorie intake to 1200 calories per day and is gradually increasing her regular exercise. She has lost two pounds since her initial visit. She has no new concerns or compaints today.       Current Medication: Outpatient Encounter Medications as of 02/10/2020  Medication Sig  . amphetamine-dextroamphetamine (ADDERALL XR) 20 MG 24 hr capsule Take 1 t  2 capsules po daily.  . [DISCONTINUED] amphetamine-dextroamphetamine (ADDERALL XR) 20 MG 24 hr capsule Take 1 t  2 capsules po daily.  . [DISCONTINUED] norgestimate-ethinyl estradiol (ORTHO-CYCLEN) 0.25-35 MG-MCG tablet Take 1 tablet by mouth daily.   No facility-administered encounter medications on file as of 02/10/2020.    Surgical History: Past Surgical History:  Procedure Laterality Date  . CHOLECYSTECTOMY    . GALLBLADDER SURGERY  7-06    Medical History: Past Medical History:  Diagnosis Date  . ADHD   . Type AB blood, Rh negative     Family History: Family History  Problem Relation Age of Onset  . Suicidality Father        2011  . Diabetes Maternal Grandfather   . Cancer Mother 33       hyst--? cervical    Social History   Socioeconomic History  . Marital status: Single    Spouse name: Not on file   . Number of children: 1  . Years of education: Not on file  . Highest education level: Not on file  Occupational History  . Not on file  Tobacco Use  . Smoking status: Never Smoker  . Smokeless tobacco: Never Used  Vaping Use  . Vaping Use: Never used  Substance and Sexual Activity  . Alcohol use: Yes    Comment: Rarely  . Drug use: No  . Sexual activity: Yes    Birth control/protection: None  Other Topics Concern  . Not on file  Social History Narrative  . Not on file   Social Determinants of Health   Financial Resource Strain:   . Difficulty of Paying Living Expenses: Not on file  Food Insecurity:   . Worried About 30 in the Last Year: Not on file  . Ran Out of Food in the Last Year: Not on file  Transportation Needs:   . Lack of Transportation (Medical): Not on file  . Lack of Transportation (Non-Medical): Not on file  Physical Activity:   . Days of Exercise per Week: Not on file  . Minutes of Exercise per Session: Not on file  Stress:   . Feeling of Stress : Not on file  Social Connections:   . Frequency of Communication with Friends and Family: Not on file  . Frequency of Social Gatherings with Friends and Family: Not on file  .  Attends Religious Services: Not on file  . Active Member of Clubs or Organizations: Not on file  . Attends Banker Meetings: Not on file  . Marital Status: Not on file  Intimate Partner Violence:   . Fear of Current or Ex-Partner: Not on file  . Emotionally Abused: Not on file  . Physically Abused: Not on file  . Sexually Abused: Not on file      Review of Systems  Constitutional: Negative for activity change, chills, fatigue and unexpected weight change.       Two pound weight loss since her last visit.   HENT: Negative for congestion, postnasal drip, rhinorrhea, sneezing and sore throat.   Respiratory: Negative for cough, chest tightness, shortness of breath and wheezing.   Cardiovascular:  Negative for chest pain and palpitations.  Gastrointestinal: Negative for abdominal pain, constipation, diarrhea, nausea and vomiting.  Endocrine: Negative for cold intolerance, heat intolerance, polydipsia and polyuria.       Most recent thyroid panel normal.  Musculoskeletal: Negative for arthralgias, back pain, joint swelling and neck pain.  Skin: Negative for rash.  Allergic/Immunologic: Negative for environmental allergies.  Neurological: Negative for dizziness, tremors, numbness and headaches.  Hematological: Negative for adenopathy. Does not bruise/bleed easily.  Psychiatric/Behavioral: Positive for decreased concentration. Negative for behavioral problems (Depression), sleep disturbance and suicidal ideas. The patient is not nervous/anxious.        Improved since she started back on adderall XR 20mg    Today's Vitals   02/10/20 0908  BP: 118/80  Pulse: 93  Resp: 16  Temp: 98.1 F (36.7 C)  SpO2: 95%  Weight: (!) 251 lb (113.9 kg)  Height: 5\' 3"  (1.6 m)   Body mass index is 44.46 kg/m.  Physical Exam Vitals and nursing note reviewed.  Constitutional:      General: She is not in acute distress.    Appearance: Normal appearance. She is well-developed. She is not diaphoretic.  HENT:     Head: Normocephalic and atraumatic.     Nose: Nose normal.     Mouth/Throat:     Pharynx: No oropharyngeal exudate.  Eyes:     Pupils: Pupils are equal, round, and reactive to light.  Neck:     Thyroid: No thyromegaly.     Vascular: No JVD.     Trachea: No tracheal deviation.  Cardiovascular:     Rate and Rhythm: Normal rate and regular rhythm.     Heart sounds: Normal heart sounds. No murmur heard.  No friction rub. No gallop.   Pulmonary:     Effort: Pulmonary effort is normal. No respiratory distress.     Breath sounds: Normal breath sounds. No wheezing or rales.  Chest:     Chest wall: No tenderness.  Abdominal:     Palpations: Abdomen is soft.  Musculoskeletal:         General: Normal range of motion.     Cervical back: Normal range of motion and neck supple.  Lymphadenopathy:     Cervical: No cervical adenopathy.  Skin:    General: Skin is warm and dry.  Neurological:     Mental Status: She is alert and oriented to person, place, and time.     Cranial Nerves: No cranial nerve deficit.  Psychiatric:        Mood and Affect: Mood normal.        Behavior: Behavior normal.        Thought Content: Thought content normal.  Judgment: Judgment normal.    Assessment/Plan: 1. Abnormal weight gain Improving.  Patient should continue to limit calorie intake to 1200/1500 calories per day and continue to gradually increase incorporation of exercise into daily routine.   2. Attention deficit disorder (ADD) in adult Improved. Continue adderall XR 20mg  - take one to two capsules daily to help with focus and concentration. Three 30 day prescriptions were sent to her pharmacy. Dates are 02/10/2020, 03/10/2020, and 04/08/2020 - amphetamine-dextroamphetamine (ADDERALL XR) 20 MG 24 hr capsule; Take 1 t  2 capsules po daily.  Dispense: 60 capsule; Refill: 0  General Counseling: Livier verbalizes understanding of the findings of todays visit and agrees with plan of treatment. I have discussed any further diagnostic evaluation that may be needed or ordered today. We also reviewed her medications today. she has been encouraged to call the office with any questions or concerns that should arise related to todays visit.   Refilled Controlled medications today. Reviewed risks and possible side effects associated with taking Stimulants. Combination of these drugs with other psychotropic medications could cause dizziness and drowsiness. Pt needs to Monitor symptoms and exercise caution in driving and operating heavy machinery to avoid damages to oneself, to others and to the surroundings. Patient verbalized understanding in this matter. Dependence and abuse for these drugs will be  monitored closely. A Controlled substance policy and procedure is on file which allows [facility] to order a urine drug screen test at any visit. Patient understands and agrees with the plan..  This patient was seen by [place] FNP Collaboration with Dr Vincent Gros as a part of collaborative care agreement  Meds ordered this encounter  Medications  . amphetamine-dextroamphetamine (ADDERALL XR) 20 MG 24 hr capsule    Sig: Take 1 t  2 capsules po daily.    Dispense:  60 capsule    Refill:  0    Please note that prescription is for XR capsules. Thanks.    Order Specific Question:   Supervising Provider    Answer:   Lyndon Code [[id number]]    Total time spent: 25 Minutes   Time spent includes review of chart, medications, test results, and follow up plan with the patient.      Dr Lyndon Code Internal medicine

## 2020-02-10 NOTE — Telephone Encounter (Signed)
At check out patient refused to schedule appointment for follow/cpe will call back to schedule. klh

## 2020-02-15 ENCOUNTER — Other Ambulatory Visit (HOSPITAL_COMMUNITY)
Admission: RE | Admit: 2020-02-15 | Discharge: 2020-02-15 | Disposition: A | Discharge status: Home / Self Care | Payer: Medicaid Other | Source: Ambulatory Visit | Attending: Obstetrics and Gynecology | Admitting: Obstetrics and Gynecology

## 2020-02-15 ENCOUNTER — Other Ambulatory Visit: Payer: Self-pay

## 2020-02-15 ENCOUNTER — Encounter: Payer: Self-pay | Admitting: Obstetrics and Gynecology

## 2020-02-15 ENCOUNTER — Ambulatory Visit (INDEPENDENT_AMBULATORY_CARE_PROVIDER_SITE_OTHER): Payer: Medicaid Other | Admitting: Obstetrics and Gynecology

## 2020-02-15 VITALS — BP 120/80 | Ht 63.0 in | Wt 251.0 lb

## 2020-02-15 DIAGNOSIS — R87810 Cervical high risk human papillomavirus (HPV) DNA test positive: Secondary | ICD-10-CM | POA: Insufficient documentation

## 2020-02-15 DIAGNOSIS — Z1151 Encounter for screening for human papillomavirus (HPV): Secondary | ICD-10-CM | POA: Diagnosis present

## 2020-02-15 DIAGNOSIS — Z124 Encounter for screening for malignant neoplasm of cervix: Secondary | ICD-10-CM | POA: Insufficient documentation

## 2020-02-15 DIAGNOSIS — Z713 Dietary counseling and surveillance: Secondary | ICD-10-CM

## 2020-02-15 DIAGNOSIS — Z01419 Encounter for gynecological examination (general) (routine) without abnormal findings: Secondary | ICD-10-CM

## 2020-02-15 NOTE — Patient Instructions (Signed)
I value your feedback and entrusting us with your care. If you get a [employer] patient survey, I would appreciate you taking the time to let us know about your experience today. Thank you!  As of June 23, 2019, your lab results will be released to your MyChart immediately, before I even have a chance to see them. Please give me time to review them and contact you if there are any abnormalities. Thank you for your patience.  

## 2020-02-15 NOTE — Progress Notes (Signed)
PCP:  Carlean Jews, NP   Chief Complaint  Patient presents with  . Gynecologic Exam     HPI:      Paula Sullivan is a 33 y.o. [id number] whose LMP was Patient's last menstrual period was 01/20/2020 (approximate)., presents today for her annual examination.  Her menses are regular every 28-30 days, lasting 7 days.  Dysmenorrhea mild, occurring first 1-2 days of flow. She does not have intermenstrual bleeding.  Sex activity: single partner, contraception -none.  OCPs started 5/21 but pt bled for 2 months and didn't feel well, so stopped them. Declines other BC. Last Pap: 12/21/18  Results were: no abnormalities /POS HPV DNA; repeat pap due today  Hx of STDs: HPV  There is no FH of breast cancer. There is no FH of ovarian cancer. The patient does do self-breast exams.  Tobacco use: The patient denies current or previous tobacco use. Alcohol use: none No drug use.  Exercise: moderately active  She does get adequate calcium but not Vitamin D in her diet. Had normal labs 5/21.    Upstream - 02/15/20 1022      Pregnancy Intention Screening   Does the patient want to become pregnant in the next year? No    Does the patient's partner want to become pregnant in the next year? No    Would the patient like to discuss contraceptive options today? No      Contraception Wrap Up   Current Method No Method - Other Reason          The pregnancy intention screening data noted above was reviewed. Potential methods of contraception were discussed. The patient elected to proceed with No Method - Other Reason.    Past Medical History:  Diagnosis Date  . ADHD   . Type AB blood, Rh negative     Past Surgical History:  Procedure Laterality Date  . CHOLECYSTECTOMY    . GALLBLADDER SURGERY  7-06    Family History  Problem Relation Age of Onset  . Suicidality Father        2011  . Diabetes Maternal Grandfather   . Cancer Mother 10       hyst--? cervical    Social History    Socioeconomic History  . Marital status: Single    Spouse name: Not on file  . Number of children: 1  . Years of education: Not on file  . Highest education level: Not on file  Occupational History  . Not on file  Tobacco Use  . Smoking status: Never Smoker  . Smokeless tobacco: Never Used  Vaping Use  . Vaping Use: Never used  Substance and Sexual Activity  . Alcohol use: Yes    Comment: Rarely  . Drug use: No  . Sexual activity: Yes    Birth control/protection: None  Other Topics Concern  . Not on file  Social History Narrative  . Not on file   Social Determinants of Health   Financial Resource Strain:   . Difficulty of Paying Living Expenses:   Food Insecurity:   . Worried About Programme researcher, broadcasting/film/video in the Last Year:   . Barista in the Last Year:   Transportation Needs:   . Freight forwarder (Medical):   Marland Kitchen Lack of Transportation (Non-Medical):   Physical Activity:   . Days of Exercise per Week:   . Minutes of Exercise per Session:   Stress:   . Feeling of Stress :  Social Connections:   . Frequency of Communication with Friends and Family:   . Frequency of Social Gatherings with Friends and Family:   . Attends Religious Services:   . Active Member of Clubs or Organizations:   . Attends Banker Meetings:   Marland Kitchen Marital Status:   Intimate Partner Violence:   . Fear of Current or Ex-Partner:   . Emotionally Abused:   Marland Kitchen Physically Abused:   . Sexually Abused:      Current Outpatient Medications:  .  amphetamine-dextroamphetamine (ADDERALL XR) 20 MG 24 hr capsule, Take 1 t  2 capsules po daily., Disp: 60 capsule, Rfl: 0 .  methocarbamol (ROBAXIN) 750 MG tablet, Take 750 mg by mouth every 8 (eight) hours., Disp: , Rfl:     ROS:  Review of Systems  Constitutional: Positive for fatigue. Negative for fever and unexpected weight change.  Respiratory: Negative for cough, shortness of breath and wheezing.   Cardiovascular: Negative  for chest pain, palpitations and leg swelling.  Gastrointestinal: Negative for blood in stool, constipation, diarrhea, nausea and vomiting.  Endocrine: Negative for cold intolerance, heat intolerance and polyuria.  Genitourinary: Negative for dyspareunia, dysuria, flank pain, frequency, genital sores, hematuria, menstrual problem, pelvic pain, urgency, vaginal bleeding, vaginal discharge and vaginal pain.  Musculoskeletal: Negative for back pain, joint swelling and myalgias.  Skin: Negative for rash.  Neurological: Negative for dizziness, syncope, light-headedness, numbness and headaches.  Hematological: Negative for adenopathy.  Psychiatric/Behavioral: Negative for agitation, confusion, sleep disturbance and suicidal ideas. The patient is not nervous/anxious.    BREAST: No symptoms   Objective: BP 120/80   Ht 5\' 3"  (1.6 m)   Wt 251 lb (113.9 kg)   LMP 01/20/2020 (Approximate)   BMI 44.46 kg/m    Physical Exam Constitutional:      Appearance: She is well-developed.  Genitourinary:     Vulva, vagina, cervix, uterus, right adnexa and left adnexa normal.     No vulval lesion or tenderness noted.     No vaginal discharge, erythema or tenderness.     No cervical polyp.     Uterus is not enlarged or tender.     No right or left adnexal mass present.     Right adnexa not tender.     Left adnexa not tender.  Neck:     Thyroid: No thyromegaly.  Cardiovascular:     Rate and Rhythm: Normal rate and regular rhythm.     Heart sounds: Normal heart sounds. No murmur heard.   Pulmonary:     Effort: Pulmonary effort is normal.     Breath sounds: Normal breath sounds.  Chest:     Breasts:        Right: No mass, nipple discharge, skin change or tenderness.        Left: No mass, nipple discharge, skin change or tenderness.  Abdominal:     Palpations: Abdomen is soft.     Tenderness: There is no abdominal tenderness. There is no guarding.  Musculoskeletal:        General: Normal range  of motion.     Cervical back: Normal range of motion.  Neurological:     General: No focal deficit present.     Mental Status: She is alert and oriented to person, place, and time.     Cranial Nerves: No cranial nerve deficit.  Skin:    General: Skin is warm and dry.  Psychiatric:        Mood and Affect: Mood  normal.        Behavior: Behavior normal.        Thought Content: Thought content normal.        Judgment: Judgment normal.  Vitals reviewed.     Assessment/Plan: Encounter for annual routine gynecological examination  Cervical cancer screening - Plan: Cytology - PAP  Screening for HPV (human papillomavirus) - Plan: Cytology - PAP  Cervical high risk human papillomavirus (HPV) DNA test positive - Plan: Cytology - PAP; repeat today, will call with results.   Weight loss counseling, encounter for--discussed keto/intermittent fasting for wt loss.     GYN counsel adequate intake of calcium and vitamin D, diet and exercise     F/U  Return in about 1 year (around 02/14/2021).  Catalina Salasar B. Arrietty Dercole, PA-C 02/15/2020 10:50 AM

## 2020-02-16 LAB — CYTOLOGY - PAP
Comment: NEGATIVE
Diagnosis: NEGATIVE
High risk HPV: POSITIVE — AB

## 2020-02-27 ENCOUNTER — Telehealth: Payer: Self-pay

## 2020-02-27 NOTE — Telephone Encounter (Signed)
Confirmed and screened for 02-29-20 ov. 

## 2020-02-28 ENCOUNTER — Other Ambulatory Visit: Payer: Self-pay

## 2020-02-28 ENCOUNTER — Ambulatory Visit
Admission: RE | Admit: 2020-02-28 | Discharge: 2020-02-28 | Disposition: A | Discharge status: Home / Self Care | Payer: Medicaid Other | Source: Ambulatory Visit | Attending: Family Medicine | Admitting: Family Medicine

## 2020-02-28 VITALS — BP 121/85 | HR 83 | Temp 99.2°F | Resp 19 | Wt 250.0 lb

## 2020-02-28 DIAGNOSIS — R0789 Other chest pain: Secondary | ICD-10-CM | POA: Diagnosis not present

## 2020-02-28 DIAGNOSIS — R0981 Nasal congestion: Secondary | ICD-10-CM | POA: Diagnosis not present

## 2020-02-28 DIAGNOSIS — H6693 Otitis media, unspecified, bilateral: Secondary | ICD-10-CM

## 2020-02-28 DIAGNOSIS — R42 Dizziness and giddiness: Secondary | ICD-10-CM

## 2020-02-28 DIAGNOSIS — Z1152 Encounter for screening for COVID-19: Secondary | ICD-10-CM

## 2020-02-28 MED ORDER — AMOXICILLIN-POT CLAVULANATE 875-125 MG PO TABS
1.0000 | ORAL_TABLET | Freq: Two times a day (BID) | ORAL | 0 refills | Status: AC
Start: 2020-02-28 — End: 2020-03-09

## 2020-02-28 MED ORDER — FLUCONAZOLE 200 MG PO TABS: 200.0000 mg | ORAL_TABLET | Freq: Once | ORAL | 0 refills | Status: AC

## 2020-02-28 NOTE — ED Provider Notes (Signed)
[facility]   [id number] 02/28/20 Arrival Time: 1129   CC: COVID symptoms  SUBJECTIVE: History from: patient.  Paula Sullivan is a 34 y.o. female who presents with abrupt onset of nasal congestion, PND, dizziness, chest discomfort when deep breathing and persistent dry cough for the last 3 days. Denies sick exposure to COVID, flu or strep. Denies recent travel. Has positive  history of Covid in 06/2019. Has not completed Covid vaccines. Has not taken OTC medications for this. There are no aggravating or alleviating factors. Denies previous symptoms in the past. Denies fever, chills, fatigue, sinus pain, rhinorrhea, sore throat, SOB, wheezing, chest pain, nausea, changes in bowel or bladder habits.    ROS: As per HPI.  All other pertinent ROS negative.     Past Medical History:  Diagnosis Date  . ADHD   . Type AB blood, Rh negative    Past Surgical History:  Procedure Laterality Date  . CHOLECYSTECTOMY    . GALLBLADDER SURGERY  7-06   Allergies  Allergen Reactions  . Latex Rash    Unknown childhood reaction.    No current facility-administered medications on file prior to encounter.   Current Outpatient Medications on File Prior to Encounter  Medication Sig Dispense Refill  . amphetamine-dextroamphetamine (ADDERALL XR) 20 MG 24 hr capsule Take 1 t  2 capsules po daily. 60 capsule 0  . diclofenac (VOLTAREN) 75 MG EC tablet diclofenac sodium 75 mg tablet,delayed release  Take 1 tablet every 12 hours by oral route.    . methocarbamol (ROBAXIN) 750 MG tablet Take 750 mg by mouth every 8 (eight) hours.    . [DISCONTINUED] norgestimate-ethinyl estradiol (ORTHO-CYCLEN) 0.25-35 MG-MCG tablet Take 1 tablet by mouth daily. 1 Package 11   Social History   Socioeconomic History  . Marital status: Single    Spouse name: Not on file  . Number of children: 1  . Years of education: Not on file  . Highest education level: Not on file  Occupational History  . Not on file    Tobacco Use  . Smoking status: Never Smoker  . Smokeless tobacco: Never Used  Vaping Use  . Vaping Use: Never used  Substance and Sexual Activity  . Alcohol use: Yes    Comment: Rarely  . Drug use: No  . Sexual activity: Yes    Birth control/protection: None  Other Topics Concern  . Not on file  Social History Narrative  . Not on file   Social Determinants of Health   Financial Resource Strain:   . Difficulty of Paying Living Expenses:   Food Insecurity:   . Worried About Programme researcher, broadcasting/film/video in the Last Year:   . Barista in the Last Year:   Transportation Needs:   . Freight forwarder (Medical):   Marland Kitchen Lack of Transportation (Non-Medical):   Physical Activity:   . Days of Exercise per Week:   . Minutes of Exercise per Session:   Stress:   . Feeling of Stress :   Social Connections:   . Frequency of Communication with Friends and Family:   . Frequency of Social Gatherings with Friends and Family:   . Attends Religious Services:   . Active Member of Clubs or Organizations:   . Attends Banker Meetings:   Marland Kitchen Marital Status:   Intimate Partner Violence:   . Fear of Current or Ex-Partner:   . Emotionally Abused:   Marland Kitchen Physically Abused:   . Sexually Abused:  Family History  Problem Relation Age of Onset  . Suicidality Father        2011  . Diabetes Maternal Grandfather   . Cancer Mother 46       hyst--? cervical    OBJECTIVE:  Vitals:   02/28/20 1140 02/28/20 1153  BP:  121/85  Pulse:  83  Resp:  19  Temp:  99.2 F (37.3 C)  TempSrc:  Oral  SpO2:  97%  Weight: 250 lb (113.4 kg)      General appearance: alert; appears fatigued, but nontoxic; speaking in full sentences and tolerating own secretions HEENT: NCAT; Ears: EACs clear, TMs pearly gray; Eyes: PERRL.  EOM grossly intact. Sinuses: nontender; Nose: nares patent without rhinorrhea, Throat: oropharynx clear, tonsils non erythematous or enlarged, uvula midline  Neck: supple  without LAD Lungs: unlabored respirations, symmetrical air entry; cough: absent; no respiratory distress; CTAB Heart: regular rate and rhythm.  Radial pulses 2+ symmetrical bilaterally Skin: warm and dry Psychological: alert and cooperative; normal mood and affect  LABS:  No results found for this or any previous visit (from the past 24 hour(s)).   ASSESSMENT & PLAN:  1. Bilateral otitis media, unspecified otitis media type   2. Nasal congestion   3. Dizziness and giddiness   4. Chest discomfort   5. Encounter for screening for COVID-19     Meds ordered this encounter  Medications  . amoxicillin-clavulanate (AUGMENTIN) 875-125 MG tablet    Sig: Take 1 tablet by mouth 2 (two) times daily for 10 days.    Dispense:  20 tablet    Refill:  0    Order Specific Question:   Supervising Provider    Answer:   Merrilee Jansky [id number]  . fluconazole (DIFLUCAN) 200 MG tablet    Sig: Take 1 tablet (200 mg total) by mouth once for 1 dose.    Dispense:  2 tablet    Refill:  0    Order Specific Question:   Supervising Provider    Answer:   Merrilee Jansky [id number]   Bilateral OM Prescribed Augmentin Prescribed fluconazole in case of yeast COVID testing ordered.  It will take between 1-2 days for test results.  Someone will contact you regarding abnormal results.    Patient should remain in quarantine until they have received Covid results.  If negative you may resume normal activities (go back to work/school) while practicing hand hygiene, social distance, and mask wearing.  If positive, patient should remain in quarantine for 10 days from symptom onset AND greater than 72 hours after symptoms resolution (absence of fever without the use of fever-reducing medication and improvement in respiratory symptoms), whichever is longer Get plenty of rest and push fluids Use OTC zyrtec for nasal congestion, runny nose, and/or sore throat Use OTC flonase for nasal congestion and runny nose Use  medications daily for symptom relief Use OTC medications like ibuprofen or tylenol as needed fever or pain Call or go to the ED if you have any new or worsening symptoms such as fever, worsening cough, shortness of breath, chest tightness, chest pain, turning blue, changes in mental status.  Reviewed expectations re: course of current medical issues. Questions answered. Outlined signs and symptoms indicating need for more acute intervention. Patient verbalized understanding. After Visit Summary given.         Moshe Cipro, NP 02/28/20 1218

## 2020-02-28 NOTE — Discharge Instructions (Addendum)
Take the Augmentin twice a day for 2 days.  I have sent in fluconazole in case of yeast. Take one tablet on the first day of symptoms and then if symptoms are still present in 3 days, take the second tablet.  Follow up as needed  Your COVID test is pending.  You should self quarantine until the test result is back.    Take Tylenol as needed for fever or discomfort.  Rest and keep yourself hydrated.    Go to the emergency department if you develop acute worsening symptoms.

## 2020-02-29 LAB — SARS-COV-2, NAA 2 DAY TAT

## 2020-02-29 LAB — NOVEL CORONAVIRUS, NAA: SARS-CoV-2, NAA: NOT DETECTED

## 2020-03-01 ENCOUNTER — Ambulatory Visit: Payer: Medicaid Other | Admitting: Nurse Practitioner

## 2020-03-05 ENCOUNTER — Encounter: Payer: Self-pay | Admitting: Nurse Practitioner

## 2020-03-05 ENCOUNTER — Other Ambulatory Visit: Payer: Self-pay | Admitting: Nurse Practitioner

## 2020-03-05 DIAGNOSIS — H6690 Otitis media, unspecified, unspecified ear: Secondary | ICD-10-CM

## 2020-03-05 MED ORDER — CIPROFLOXACIN-DEXAMETHASONE 0.3-0.1 % OT SUSP: 4.0000 [drp] | Freq: Two times a day (BID) | OTIC | 0 refills | Status: DC

## 2020-03-05 MED ORDER — AZITHROMYCIN 250 MG PO TABS: ORAL_TABLET | ORAL | 0 refills | Status: DC

## 2020-03-26 ENCOUNTER — Encounter: Payer: Self-pay | Admitting: Obstetrics and Gynecology

## 2020-03-26 ENCOUNTER — Other Ambulatory Visit (HOSPITAL_COMMUNITY)
Admission: RE | Admit: 2020-03-26 | Discharge: 2020-03-26 | Disposition: A | Discharge status: Home / Self Care | Payer: Medicaid Other | Source: Ambulatory Visit | Attending: Obstetrics and Gynecology | Admitting: Obstetrics and Gynecology

## 2020-03-26 ENCOUNTER — Ambulatory Visit (INDEPENDENT_AMBULATORY_CARE_PROVIDER_SITE_OTHER): Payer: Medicaid Other | Admitting: Obstetrics and Gynecology

## 2020-03-26 ENCOUNTER — Other Ambulatory Visit: Payer: Self-pay

## 2020-03-26 VITALS — BP 120/76 | HR 72 | Resp 18 | Ht 63.0 in | Wt 260.6 lb

## 2020-03-26 DIAGNOSIS — Z113 Encounter for screening for infections with a predominantly sexual mode of transmission: Secondary | ICD-10-CM | POA: Diagnosis not present

## 2020-03-26 DIAGNOSIS — R87618 Other abnormal cytological findings on specimens from cervix uteri: Secondary | ICD-10-CM

## 2020-03-26 DIAGNOSIS — R8789 Other abnormal findings in specimens from female genital organs: Secondary | ICD-10-CM

## 2020-03-26 DIAGNOSIS — Z23 Encounter for immunization: Secondary | ICD-10-CM

## 2020-03-26 NOTE — Progress Notes (Signed)
   GYNECOLOGY CLINIC COLPOSCOPY PROCEDURE NOTE  34 y.o. [id number] here for colposcopy for NIL and HR HPV+  pap smear on 02/15/2020. Discussed underlying role for HPV infection in the development of cervical dysplasia, its natural history and progression/regression, need for surveillance.  Is the patient  pregnant: No LMP: Patient's last menstrual period was 03/25/2020. Smoking status:  reports that she has never smoked. She has never used smokeless tobacco. Future fertility desired:  No  Patient given informed consent, signed copy in the chart, time out was performed.  The patient was position in dorsal lithotomy position. Speculum was placed the cervix was visualized.   After application of acetic acid colposcopic inspection of the cervix was undertaken.   Colposcopy adequate, full visualization of transformation zone: Yes no visible lesions; corresponding biopsies obtained.   ECC specimen obtained:  Yes  All specimens were labeled and sent to pathology.   Patient was given post procedure instructions.  Will follow up pathology and manage accordingly.  Routine preventative health maintenance measures emphasized.  Physical Exam Genitourinary:       STD testing at patient request HPV vaccination today   Adelene Idler MD [facility], [facility] 03/26/2020 8:45 AM

## 2020-03-26 NOTE — Patient Instructions (Signed)
Colposcopy, Care After This sheet gives you information about how to care for yourself after your procedure. Your doctor may also give you more specific instructions. If you have problems or questions, contact your doctor. What can I expect after the procedure? If you did not have a tissue sample removed (did not have a biopsy), you may only have some spotting for a few days. You can go back to your normal activities. If you had a tissue sample removed, it is common to have:  Soreness and pain. This may last for a few days.  Light-headedness.  Mild bleeding from your vagina or dark-colored, grainy discharge from your vagina. This may last for a few days. You may need to wear a sanitary pad.  Spotting for at least 48 hours after the procedure. Follow these instructions at home:   Take over-the-counter and prescription medicines only as told by your doctor. Ask your doctor what medicines you can start taking again. This is very important if you take blood-thinning medicine.  Do not drive or use heavy machinery while taking prescription pain medicine.  For 3 days, or as long as your doctor tells you, avoid: ? Douching. ? Using tampons. ? Having sex.  If you use birth control (contraception), keep using it.  Limit activity for the first day after the procedure. Ask your doctor what activities are safe for you.  It is up to you to get the results of your procedure. Ask your doctor when your results will be ready.  Keep all follow-up visits as told by your doctor. This is important. Contact a doctor if:  You get a skin rash. Get help right away if:  You are bleeding a lot from your vagina. It is a lot of bleeding if you are using more than one pad an hour for 2 hours in a row.  You have clumps of blood (blood clots) coming from your vagina.  You have a fever.  You have chills  You have pain in your lower belly (pelvic area).  You have signs of infection, such as vaginal  discharge that is: ? Different than usual. ? Yellow. ? Bad-smelling.  You have very pain or cramps in your lower belly that do not get better with medicine.  You feel light-headed.  You feel dizzy.  You pass out (faint). Summary  If you did not have a tissue sample removed (did not have a biopsy), you may only have some spotting for a few days. You can go back to your normal activities.  If you had a tissue sample removed, it is common to have mild pain and spotting for 48 hours.  For 3 days, or as long as your doctor tells you, avoid douching, using tampons and having sex.  Get help right away if you have bleeding, very bad pain, or signs of infection. This information is not intended to replace advice given to you by your health care provider. Make sure you discuss any questions you have with your health care provider. Document Revised: 06/12/2017 Document Reviewed: 03/19/2016 Elsevier Patient Education  2020 [employer]  

## 2020-03-27 LAB — CERVICOVAGINAL ANCILLARY ONLY
Chlamydia: NEGATIVE
Comment: NEGATIVE
Comment: NEGATIVE
Comment: NORMAL
Neisseria Gonorrhea: NEGATIVE
Trichomonas: NEGATIVE

## 2020-03-27 LAB — SURGICAL PATHOLOGY

## 2020-04-10 ENCOUNTER — Encounter: Payer: Self-pay | Admitting: Nurse Practitioner

## 2020-04-10 NOTE — Telephone Encounter (Signed)
Please see this

## 2020-05-26 DIAGNOSIS — Z23 Encounter for immunization: Secondary | ICD-10-CM | POA: Diagnosis not present

## 2020-05-28 ENCOUNTER — Ambulatory Visit (INDEPENDENT_AMBULATORY_CARE_PROVIDER_SITE_OTHER): Payer: Medicaid Other

## 2020-05-28 ENCOUNTER — Other Ambulatory Visit: Payer: Self-pay

## 2020-05-28 DIAGNOSIS — Z23 Encounter for immunization: Secondary | ICD-10-CM

## 2020-05-28 NOTE — Addendum Note (Signed)
Addended by: Kathlene Cote on: 05/28/2020 09:09 AM   Modules accepted: Orders

## 2020-05-28 NOTE — Addendum Note (Signed)
Addended by: Clement Husbands A on: 05/28/2020 09:21 AM   Modules accepted: Orders

## 2020-05-28 NOTE — Progress Notes (Signed)
Patient presents today for Gardisil #2. Given IM Right Deltoid. Patient tolerated well.

## 2020-05-31 ENCOUNTER — Ambulatory Visit: Payer: Medicaid Other | Admitting: Internal Medicine

## 2020-05-31 ENCOUNTER — Encounter: Payer: Self-pay | Admitting: Nurse Practitioner

## 2020-05-31 ENCOUNTER — Ambulatory Visit
Admission: RE | Admit: 2020-05-31 | Discharge: 2020-05-31 | Disposition: A | Discharge status: Home / Self Care | Payer: Medicaid Other | Source: Ambulatory Visit | Attending: Physician Assistant | Admitting: Physician Assistant

## 2020-05-31 ENCOUNTER — Encounter: Payer: Self-pay | Admitting: Internal Medicine

## 2020-05-31 ENCOUNTER — Other Ambulatory Visit: Payer: Self-pay

## 2020-05-31 VITALS — BP 127/62 | HR 97 | Temp 98.1°F | Resp 18 | Ht 63.0 in | Wt 259.7 lb

## 2020-05-31 DIAGNOSIS — R519 Headache, unspecified: Secondary | ICD-10-CM | POA: Insufficient documentation

## 2020-05-31 DIAGNOSIS — K625 Hemorrhage of anus and rectum: Secondary | ICD-10-CM | POA: Diagnosis not present

## 2020-05-31 DIAGNOSIS — Z23 Encounter for immunization: Secondary | ICD-10-CM

## 2020-05-31 DIAGNOSIS — Z20822 Contact with and (suspected) exposure to covid-19: Secondary | ICD-10-CM | POA: Insufficient documentation

## 2020-05-31 LAB — RESP PANEL BY RT-PCR (RSV, FLU A&B, COVID)  RVPGX2
Influenza A by PCR: NEGATIVE
Influenza B by PCR: NEGATIVE
Resp Syncytial Virus by PCR: NEGATIVE
SARS Coronavirus 2 by RT PCR: NEGATIVE

## 2020-05-31 MED ORDER — HYDROCORTISONE ACETATE 25 MG RE SUPP: RECTAL | 0 refills | Status: DC

## 2020-05-31 NOTE — Discharge Instructions (Addendum)
Covid test is negative today.  Consider retesting if you have any further symptoms such as fever, increased fatigue, sweats, chills, cough, sore throat, congestion, nausea/vomiting or diarrhea, changes in smell or taste, chest pain or breathing difficulty.  Tylenol and Motrin can help with your current headache.  If you feel you are congested then start over-the-counter Sudafed consider use of Flonase nasal spray.

## 2020-05-31 NOTE — ED Provider Notes (Signed)
[facility]    CSN: [id number] Arrival date & time: 05/31/20  1241      History   Chief Complaint Chief Complaint  Patient presents with  . Appointment  . Sinus Problem  . Covid Exposure    HPI Paula Sullivan is a 34 y.o. female presenting for 2-day history of sinus headaches.  Patient states that she has been exposed to Covid through her boss who tested positive today. She denies any associated fever, fatigue, chills, sweats, cough, sore throat or runny nose.  Denies any chest pain or difficulty.  No abdominal pain, nausea, vomiting or diarrhea.  No change in smell or taste.  Patient states she has a personal history of Covid about a year ago.  She says that her same boss gave for Covid last year and she is afraid of getting it again.  Patient not vaccinated for Covid.  She has not needed to take anything for symptoms.  She is otherwise healthy without any cardiopulmonary disease.  No other complaints or concerns today.   HPI  Past Medical History:  Diagnosis Date  . ADHD   . Type AB blood, Rh negative     Patient Active Problem List   Diagnosis Date Noted  . Cervical high risk human papillomavirus (HPV) DNA test positive 02/15/2020  . Abnormal weight gain 01/09/2020  . Attention deficit disorder (ADD) in adult 01/09/2020  . IUD check up 01/12/2017  . Headache 07/23/2016  . Lower abdominal pain 07/13/2016    Past Surgical History:  Procedure Laterality Date  . CHOLECYSTECTOMY    . GALLBLADDER SURGERY  7-06    OB History    Gravida  4   Para  2   Term  1   Preterm      AB  2   Living  1     SAB  1   TAB      Ectopic  1   Multiple  0   Live Births  1            Home Medications    Prior to Admission medications   Medication Sig Start Date End Date Taking? Authorizing Provider  amphetamine-dextroamphetamine (ADDERALL XR) 20 MG 24 hr capsule Take 1 t  2 capsules po daily. 02/10/20  Yes Carlean Jews, NP  hydrocortisone  (ANUSOL-HC) 25 MG suppository Insert one after EACH bm QD 05/31/20   Lyndon Code, MD  meloxicam (MOBIC) 7.5 MG tablet Take 7.5 mg by mouth 2 (two) times daily. 04/11/20   [provider]  norgestimate-ethinyl estradiol (ORTHO-CYCLEN) 0.25-35 MG-MCG tablet Take 1 tablet by mouth daily. 12/21/18 06/23/19  Nadara Mustard, MD    Family History Family History  Problem Relation Age of Onset  . Suicidality Father        2011  . Diabetes Maternal Grandfather   . Cancer Mother 1       hyst--? cervical    Social History Social History   Tobacco Use  . Smoking status: Never Smoker  . Smokeless tobacco: Never Used  Vaping Use  . Vaping Use: Never used  Substance Use Topics  . Alcohol use: Yes    Comment: Rarely  . Drug use: No     Allergies   Latex   Review of Systems Review of Systems  Constitutional: Negative for chills, diaphoresis, fatigue and fever.  HENT: Positive for sinus pressure. Negative for congestion, ear pain, rhinorrhea, sinus pain and sore throat.   Respiratory:  Negative for cough and shortness of breath.   Gastrointestinal: Negative for abdominal pain, nausea and vomiting.  Musculoskeletal: Negative for arthralgias and myalgias.  Skin: Negative for rash.  Neurological: Positive for headaches. Negative for weakness.  Hematological: Negative for adenopathy.     Physical Exam Triage Vital Signs ED Triage Vitals  Enc Vitals Group     BP 05/31/20 1322 127/62     Pulse Rate 05/31/20 1322 97     Resp 05/31/20 1322 18     Temp 05/31/20 1322 98.1 F (36.7 C)     Temp Source 05/31/20 1322 Oral     SpO2 05/31/20 1322 99 %     Weight 05/31/20 1319 259 lb 11.2 oz (117.8 kg)     Height 05/31/20 1319 5\' 3"  (1.6 m)     Head Circumference --      Peak Flow --      Pain Score 05/31/20 1319 0     Pain Loc --      Pain Edu? --      Excl. in GC? --    No data found.  Updated Vital Signs BP 127/62 (BP Location: Left Arm)   Pulse 97   Temp 98.1 F  (36.7 C) (Oral)   Resp 18   Ht 5\' 3"  (1.6 m)   Wt 259 lb 11.2 oz (117.8 kg)   LMP 05/02/2020 (Approximate)   SpO2 99%   BMI 46.00 kg/m    Physical Exam Vitals and nursing note reviewed.  Constitutional:      General: She is not in acute distress.    Appearance: Normal appearance. She is not ill-appearing or toxic-appearing.  HENT:     Head: Normocephalic and atraumatic.     Nose: No congestion or rhinorrhea.     Mouth/Throat:     Mouth: Mucous membranes are moist.     Pharynx: Oropharynx is clear.  Eyes:     General: No scleral icterus.       Right eye: No discharge.        Left eye: No discharge.     Conjunctiva/sclera: Conjunctivae normal.  Cardiovascular:     Rate and Rhythm: Normal rate and regular rhythm.     Heart sounds: Normal heart sounds.  Pulmonary:     Effort: Pulmonary effort is normal. No respiratory distress.     Breath sounds: Normal breath sounds.  Musculoskeletal:     Cervical back: Neck supple.  Skin:    General: Skin is dry.  Neurological:     General: No focal deficit present.     Mental Status: She is alert. Mental status is at baseline.     Motor: No weakness.     Gait: Gait normal.  Psychiatric:        Mood and Affect: Mood normal.        Behavior: Behavior normal.        Thought Content: Thought content normal.      UC Treatments / Results  Labs (all labs ordered are listed, but only abnormal results are displayed) Labs Reviewed  RESP PANEL BY RT-PCR (RSV, FLU A&B, COVID)  [id number]    EKG   Radiology No results found.  Procedures Procedures (including critical care time)  Medications Ordered in UC Medications - No data to display  Initial Impression / Assessment and Plan / UC Course  I have reviewed the triage vital signs and the nursing notes.  Pertinent labs & imaging results that were available during my care  of the patient were reviewed by me and considered in my medical decision making (see chart for details).    Negative respiratory panel.  CDC guidelines reviewed with patient and printed handout.  Advised to follow-up with her department or elsewhere for repeat Covid testing if she develops any other symptoms.   Final Clinical Impressions(s) / UC Diagnoses   Final diagnoses:  Acute nonintractable headache, unspecified headache type  Exposure to COVID-19 virus     Discharge Instructions     Covid test is negative today.  Consider retesting if you have any further symptoms such as fever, increased fatigue, sweats, chills, cough, sore throat, congestion, nausea/vomiting or diarrhea, changes in smell or taste, chest pain or breathing difficulty.  Tylenol and Motrin can help with your current headache.  If you feel you are congested then start over-the-counter Sudafed consider use of Flonase nasal spray.    ED Prescriptions    None     PDMP not reviewed this encounter.   Shirlee Latch, PA-C 05/31/20 1422

## 2020-05-31 NOTE — Progress Notes (Signed)
[facility] [address] [place], [place] [address]  Internal MEDICINE  Telephone Visit  Patient Name: Paula Sullivan  [id number]  [id number]  Date of Service: 05/31/2020  I connected with the patient at 1104 by telephone and verified the patients identity using two identifiers.   I discussed the limitations, risks, security and privacy concerns of performing an evaluation and management service by telephone and the availability of in person appointments. I also discussed with the patient that there may be a patient responsible charge related to the service.  The patient expressed understanding and agrees to proceed.    Chief Complaint  Patient presents with  . Follow-up  . ADHD  . Rectal Bleeding    on and off, x 1 month, painfull and can shoot up pt's back, tried to go to ER but was not seen in 18 hours and left  . policy update form    received    HPI After phone conversation, pt is brought back with ongoing rectal bleed, she feels like there is something in her rectum, denies any constipation or diarrhea , does have lower abdominal discomfort. Did go to ED with these complaints but left after 18 hours of wait without being seen. She notices blood after her BM and on wipes. She has treated this problem before with OTC meds however this time it is not helping. Does have fhx of colon cancer     Current Medication: Outpatient Encounter Medications as of 05/31/2020  Medication Sig  . amphetamine-dextroamphetamine (ADDERALL XR) 20 MG 24 hr capsule Take 1 t  2 capsules po daily.  . hydrocortisone (ANUSOL-HC) 25 MG suppository Insert one after EACH bm QD  . [DISCONTINUED] norgestimate-ethinyl estradiol (ORTHO-CYCLEN) 0.25-35 MG-MCG tablet Take 1 tablet by mouth daily.   No facility-administered encounter medications on file as of 05/31/2020.    Surgical History: Past Surgical History:  Procedure Laterality Date  . CHOLECYSTECTOMY    . GALLBLADDER SURGERY  7-06     Medical History: Past Medical History:  Diagnosis Date  . ADHD   . Type AB blood, Rh negative     Family History: Family History  Problem Relation Age of Onset  . Suicidality Father        2011  . Diabetes Maternal Grandfather   . Cancer Mother 35       hyst--? cervical    Social History   Socioeconomic History  . Marital status: Single    Spouse name: Not on file  . Number of children: 1  . Years of education: Not on file  . Highest education level: Not on file  Occupational History  . Not on file  Tobacco Use  . Smoking status: Never Smoker  . Smokeless tobacco: Never Used  Vaping Use  . Vaping Use: Never used  Substance and Sexual Activity  . Alcohol use: Yes    Comment: Rarely  . Drug use: No  . Sexual activity: Yes    Birth control/protection: None  Other Topics Concern  . Not on file  Social History Narrative  . Not on file   Social Determinants of Health   Financial Resource Strain:   . Difficulty of Paying Living Expenses: Not on file  Food Insecurity:   . Worried About Programme researcher, broadcasting/film/video in the Last Year: Not on file  . Ran Out of Food in the Last Year: Not on file  Transportation Needs:   . Lack of Transportation (Medical): Not on file  .  Lack of Transportation (Non-Medical): Not on file  Physical Activity:   . Days of Exercise per Week: Not on file  . Minutes of Exercise per Session: Not on file  Stress:   . Feeling of Stress : Not on file  Social Connections:   . Frequency of Communication with Friends and Family: Not on file  . Frequency of Social Gatherings with Friends and Family: Not on file  . Attends Religious Services: Not on file  . Active Member of Clubs or Organizations: Not on file  . Attends Banker Meetings: Not on file  . Marital Status: Not on file  Intimate Partner Violence:   . Fear of Current or Ex-Partner: Not on file  . Emotionally Abused: Not on file  . Physically Abused: Not on file  . Sexually  Abused: Not on file      Review of Systems  Constitutional: Negative for chills and fatigue.  HENT: Negative for ear pain, postnasal drip and sinus pressure.   Eyes: Negative for photophobia, discharge, redness, itching and visual disturbance.  Respiratory: Negative for cough, shortness of breath and wheezing.   Cardiovascular: Negative for chest pain, palpitations and leg swelling.  Gastrointestinal: Positive for anal bleeding and rectal pain. Negative for abdominal pain, constipation, diarrhea, nausea and vomiting.  Genitourinary: Positive for flank pain.  Musculoskeletal: Negative for arthralgias, back pain, gait problem and neck pain.  Skin: Negative for color change.  Allergic/Immunologic: Negative for environmental allergies and food allergies.  Neurological: Negative for dizziness and headaches.  Hematological: Does not bruise/bleed easily.  Psychiatric/Behavioral: Negative for agitation, behavioral problems (depression) and hallucinations.    Vital Signs: BP (!) 120/94   Pulse 90   Temp 97.7 F (36.5 C)   Resp 16   Ht 5\' 3"  (1.6 m)   Wt 259 lb 12.8 oz (117.8 kg)   SpO2 99%   BMI 46.02 kg/m    Physical Exam Abdominal:     General: Abdomen is flat.     Palpations: Abdomen is soft.     Comments: No thrombosed or external hemorrhoids are seen      Assessment/Plan: 1. Rectal bleeding Pt needs further work up  - hydrocortisone (ANUSOL-HC) 25 MG suppository; Insert one after EACH bm QD  Dispense: 24 suppository; Refill: 0 - CT ABDOMEN PELVIS W WO CONTRAST; Future - Ambulatory referral to Gastroenterology  2. Flu vaccine need - Flu Vaccine MDCK QUAD PF  General Counseling: Louisa verbalizes understanding of the findings of today's phone visit and agrees with plan of treatment. I have discussed any further diagnostic evaluation that may be needed or ordered today. We also reviewed her medications today. she has been encouraged to call the office with any questions or  concerns that should arise related to todays visit.  Orders Placed This Encounter  Procedures  . CT ABDOMEN PELVIS W WO CONTRAST  . Flu Vaccine MDCK QUAD PF    Meds ordered this encounter  Medications  . hydrocortisone (ANUSOL-HC) 25 MG suppository    Sig: Insert one after EACH bm QD    Dispense:  24 suppository    Refill:  0    Time spent: 70 Minutes    Dr 22 Internal medicine

## 2020-05-31 NOTE — ED Triage Notes (Signed)
Pt c/o sinus pain, pressure. Started about 2 days ago. She states her boss just tested positive today for covid.

## 2020-06-03 ENCOUNTER — Ambulatory Visit: Payer: Self-pay

## 2020-06-12 ENCOUNTER — Encounter: Payer: Self-pay | Admitting: Nurse Practitioner

## 2020-06-12 NOTE — Telephone Encounter (Signed)
Please check on CT

## 2020-06-19 ENCOUNTER — Telehealth: Payer: Self-pay

## 2020-06-19 NOTE — Telephone Encounter (Signed)
Does she have app with GI??

## 2020-06-19 NOTE — Telephone Encounter (Signed)
Thanks

## 2020-06-30 ENCOUNTER — Other Ambulatory Visit: Payer: Self-pay | Admitting: Nurse Practitioner

## 2020-06-30 ENCOUNTER — Other Ambulatory Visit: Payer: Self-pay | Admitting: Internal Medicine

## 2020-06-30 DIAGNOSIS — F988 Other specified behavioral and emotional disorders with onset usually occurring in childhood and adolescence: Secondary | ICD-10-CM

## 2020-06-30 DIAGNOSIS — K625 Hemorrhage of anus and rectum: Secondary | ICD-10-CM

## 2020-07-01 MED ORDER — HYDROCORTISONE ACETATE 25 MG RE SUPP: RECTAL | 0 refills | Status: DC

## 2020-07-10 ENCOUNTER — Telehealth: Payer: Self-pay

## 2020-07-10 ENCOUNTER — Ambulatory Visit: Payer: Medicaid Other | Admitting: Nurse Practitioner

## 2020-07-10 NOTE — Telephone Encounter (Signed)
lmom to reschedule missed ov from 07-10-20.

## 2020-07-24 ENCOUNTER — Encounter: Payer: Self-pay | Admitting: Gastroenterology

## 2020-07-24 ENCOUNTER — Ambulatory Visit (INDEPENDENT_AMBULATORY_CARE_PROVIDER_SITE_OTHER): Payer: Medicaid Other | Admitting: Gastroenterology

## 2020-07-24 ENCOUNTER — Other Ambulatory Visit: Payer: Self-pay

## 2020-07-24 VITALS — BP 122/87 | HR 84 | Temp 97.5°F | Ht 63.0 in | Wt 264.5 lb

## 2020-07-24 DIAGNOSIS — K601 Chronic anal fissure: Secondary | ICD-10-CM | POA: Diagnosis not present

## 2020-07-24 DIAGNOSIS — K625 Hemorrhage of anus and rectum: Secondary | ICD-10-CM

## 2020-07-24 DIAGNOSIS — K641 Second degree hemorrhoids: Secondary | ICD-10-CM

## 2020-07-24 NOTE — Progress Notes (Signed)
Paula Darby, MD [address]  [place]  [place], [place] [address]  Main: [phone]  Fax: [phone]    Gastroenterology Consultation  Referring Provider:     Lavera Guise, MD Primary Care Physician:  Paula Freshwater, NP Primary Gastroenterologist:  Dr. Cephas Sullivan Reason for Consultation:     Rectal bleeding        HPI:   Paula Sullivan is a 35 y.o. female referred by Dr. Ronnell Freshwater, NP  for consultation & management of rectal bleeding.  Patient reports that since end of October, she developed rectal pain with pressure and swelling as well as intermittent bright red blood per rectum, mostly on toilet paper and sometimes associated with blood clots.  Her rectal pain is worse towards the end of the day, radiating to the tailbone, lower back and unable to sit down.  She reports urge to have a bowel movement.  She also reports that her bowel movements have changed from regular consistency to soft and mushy since then.  Usually about once or twice daily.  Associated with abdominal bloating, sensation of incomplete emptying.  She does have to strain despite having soft bowel movements.  She also noticed protrusion of soft tissue outside the anal canal couple of weeks ago which she applied Preparation H, witch hazel as she could not afford hydrocortisone suppository.  The size of this soft tissue has improved since then.  Patient reports that she has gained about 60 pounds within last 1 year.  Her thyroid profile is normal.  She denies carbonated beverages, does admit to increased intake of more dairy products  Hemoglobin is normal on 05/26/2020, mildly elevated lipase, normal ESR and CRP, normal CMP and PT/INR  NSAIDs: None  Antiplts/Anticoagulants/Anti thrombotics: None  GI Procedures: None No known family history of GI malignancy, IBD  Past Medical History:  Diagnosis Date  . ADHD   . Type AB blood, Rh negative     Past Surgical History:  Procedure  Laterality Date  . CHOLECYSTECTOMY    . GALLBLADDER SURGERY  7-06   No current outpatient medications on file.   Family History  Problem Relation Age of Onset  . Suicidality Father        2011  . Diabetes Maternal Grandfather   . Cancer Mother 25       hyst--? cervical     Social History   Tobacco Use  . Smoking status: Never Smoker  . Smokeless tobacco: Never Used  Vaping Use  . Vaping Use: Never used  Substance Use Topics  . Alcohol use: Yes    Comment: Rarely  . Drug use: No    Allergies as of 07/24/2020 - Review Complete 07/24/2020  Allergen Reaction Noted  . Latex Rash 10/20/2011    Review of Systems:    All systems reviewed and negative except where noted in HPI.   Physical Exam:  BP 122/87 (BP Location: Left Arm, Patient Position: Sitting, Cuff Size: Large)   Pulse 84   Temp (!) 97.5 F (36.4 C) (Oral)   Ht _0  (1.6 m)   Wt 264 lb 8 oz (120 kg)   BMI 46.85 kg/m  No LMP recorded.  General:   Alert,  Well-developed, well-nourished, pleasant and cooperative in NAD Head:  Normocephalic and atraumatic. Eyes:  Sclera clear, no icterus.   Conjunctiva pink. Ears:  Normal auditory acuity. Nose:  No deformity, discharge, or lesions. Mouth:  No deformity or lesions,oropharynx pink &  moist. Neck:  Supple; no masses or thyromegaly. Lungs:  Respirations even and unlabored.  Clear throughout to auscultation.   No wheezes, crackles, or rhonchi. No acute distress. Heart:  Regular rate and rhythm; no murmurs, clicks, rubs, or gallops. Abdomen:  Normal bowel sounds. Soft, non-tender and non-distended without masses, hepatosplenomegaly or hernias noted.  No guarding or rebound tenderness.   Rectal: Small prolapsed external hemorrhoid, no perianal lesions, tender posterior wall of anal canal Msk:  Symmetrical without gross deformities. Good, equal movement & strength bilaterally. Pulses:  Normal pulses noted. Extremities:  No clubbing or edema.  No  cyanosis. Neurologic:  Alert and oriented x3;  grossly normal neurologically. Skin:  Intact without significant lesions or rashes. No jaundice. Psych:  Alert and cooperative. Normal mood and affect.  Imaging Studies: None  Assessment and Plan:   KENDALLYN LIPPOLD is a 35 y.o. 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

## 2020-07-24 NOTE — Patient Instructions (Signed)
[facility] [address] [place] [address]. Phone number [phone]. Please allow at least 24 hours before picking up the compounded cream because the pharmacy has to make the medication.   High-Fiber Eating Plan Fiber, also called dietary fiber, is a type of carbohydrate. It is found foods such as fruits, vegetables, whole grains, and beans. A high-fiber diet can have many health benefits. Your health care provider may recommend a high-fiber diet to help:  Prevent constipation. Fiber can make your bowel movements more regular.  Lower your cholesterol.  Relieve the following conditions: ? Inflammation of veins in the anus (hemorrhoids). ? Inflammation of specific areas of the digestive tract (uncomplicated diverticulosis). ? A problem of the large intestine, also called the colon, that sometimes causes pain and diarrhea (irritable bowel syndrome, or IBS).  Prevent overeating as part of a weight-loss plan.  Prevent heart disease, type 2 diabetes, and certain cancers. What are tips for following this plan? Reading food labels  Check the nutrition facts label on food products for the amount of dietary fiber. Choose foods that have 5 grams of fiber or more per serving.  The goals for recommended daily fiber intake include: ? Men (age 64 or younger): 34-38 g. ? Men (over age 36): 28-34 g. ? Women (age 72 or younger): 25-28 g. ? Women (over age 2): 22-25 g. Your daily fiber goal is _____________ g.   Shopping  Choose whole fruits and vegetables instead of processed forms, such as apple juice or applesauce.  Choose a wide variety of high-fiber foods such as avocados, lentils, oats, and kidney beans.  Read the nutrition facts label of the foods you choose. Be aware of foods with added fiber. These foods often have high sugar and sodium amounts per serving. Cooking  Use whole-grain flour for baking and cooking.  Cook with brown rice instead of white rice. Meal  planning  Start the day with a breakfast that is high in fiber, such as a cereal that contains 5 g of fiber or more per serving.  Eat breads and cereals that are made with whole-grain flour instead of refined flour or white flour.  Eat brown rice, bulgur wheat, or millet instead of white rice.  Use beans in place of meat in soups, salads, and pasta dishes.  Be sure that half of the grains you eat each day are whole grains. General information  You can get the recommended daily intake of dietary fiber by: ? Eating a variety of fruits, vegetables, grains, nuts, and beans. ? Taking a fiber supplement if you are not able to take in enough fiber in your diet. It is better to get fiber through food than from a supplement.  Gradually increase how much fiber you consume. If you increase your intake of dietary fiber too quickly, you may have bloating, cramping, or gas.  Drink plenty of water to help you digest fiber.  Choose high-fiber snacks, such as berries, raw vegetables, nuts, and popcorn. What foods should I eat? Fruits Berries. Pears. Apples. Oranges. Avocado. Prunes and raisins. Dried figs. Vegetables Sweet potatoes. Spinach. Kale. Artichokes. Cabbage. Broccoli. Cauliflower. Green peas. Carrots. Squash. Grains Whole-grain breads. Multigrain cereal. Oats and oatmeal. Brown rice. Barley. Bulgur wheat. Millet. Quinoa. Bran muffins. Popcorn. Rye wafer crackers. Meats and other proteins Navy beans, kidney beans, and pinto beans. Soybeans. Split peas. Lentils. Nuts and seeds. Dairy Fiber-fortified yogurt. Beverages Fiber-fortified soy milk. Fiber-fortified orange juice. Other foods Fiber bars. The items listed above may  not be a complete list of recommended foods and beverages. Contact a dietitian for more information. What foods should I avoid? Fruits Fruit juice. Cooked, strained fruit. Vegetables Fried potatoes. Canned vegetables. Well-cooked vegetables. Grains White bread.  Pasta made with refined flour. White rice. Meats and other proteins Fatty cuts of meat. Fried chicken or fried fish. Dairy Milk. Yogurt. Cream cheese. Sour cream. Fats and oils Butters. Beverages Soft drinks. Other foods Cakes and pastries. The items listed above may not be a complete list of foods and beverages to avoid. Talk with your dietitian about what choices are best for you. Summary  Fiber is a type of carbohydrate. It is found in foods such as fruits, vegetables, whole grains, and beans.  A high-fiber diet has many benefits. It can help to prevent constipation, lower blood cholesterol, aid weight loss, and reduce your risk of heart disease, diabetes, and certain cancers.  Increase your intake of fiber gradually. Increasing fiber too quickly may cause cramping, bloating, and gas. Drink plenty of water while you increase the amount of fiber you consume.  The best sources of fiber include whole fruits and vegetables, whole grains, nuts, seeds, and beans. This information is not intended to replace advice given to you by your health care provider. Make sure you discuss any questions you have with your health care provider. Document Revised: 11/03/2019 Document Reviewed: 11/03/2019 Elsevier Patient Education  2021 [employer].

## 2020-08-03 ENCOUNTER — Encounter: Payer: Self-pay | Admitting: Nurse Practitioner

## 2020-08-09 ENCOUNTER — Ambulatory Visit: Payer: Medicaid Other | Admitting: Hospice and Palliative Medicine

## 2020-08-15 ENCOUNTER — Encounter: Payer: Self-pay | Admitting: Hospice and Palliative Medicine

## 2020-08-15 ENCOUNTER — Ambulatory Visit: Payer: Medicaid Other | Admitting: Hospice and Palliative Medicine

## 2020-08-15 ENCOUNTER — Other Ambulatory Visit: Payer: Self-pay

## 2020-08-15 VITALS — BP 120/84 | HR 80 | Temp 97.4°F | Resp 16 | Ht 63.0 in | Wt 271.0 lb

## 2020-08-15 DIAGNOSIS — F411 Generalized anxiety disorder: Secondary | ICD-10-CM | POA: Diagnosis not present

## 2020-08-15 DIAGNOSIS — F988 Other specified behavioral and emotional disorders with onset usually occurring in childhood and adolescence: Secondary | ICD-10-CM

## 2020-08-15 DIAGNOSIS — Z79899 Other long term (current) drug therapy: Secondary | ICD-10-CM | POA: Diagnosis not present

## 2020-08-15 DIAGNOSIS — R5383 Other fatigue: Secondary | ICD-10-CM

## 2020-08-15 DIAGNOSIS — R635 Abnormal weight gain: Secondary | ICD-10-CM

## 2020-08-15 MED ORDER — HYDROXYZINE PAMOATE 25 MG PO CAPS: ORAL_CAPSULE | ORAL | 0 refills | Status: DC

## 2020-08-15 MED ORDER — AMPHETAMINE-DEXTROAMPHETAMINE 20 MG PO TABS: 20.0000 mg | ORAL_TABLET | Freq: Two times a day (BID) | ORAL | 0 refills | Status: DC

## 2020-08-15 MED ORDER — ESCITALOPRAM OXALATE 5 MG PO TABS: 5.0000 mg | ORAL_TABLET | Freq: Every day | ORAL | 1 refills | Status: DC

## 2020-08-15 NOTE — Progress Notes (Signed)
[facility] [address] [place], [place] [address]  Internal MEDICINE  Office Visit Note  Patient Name: Paula Sullivan  [id number]  [id number]  Date of Service: 08/18/2020  Chief Complaint  Patient presents with  . Follow-up    Refill request, not sleeping, maybe gets an hour a night  . ADHD    HPI Patient is here for routine follow-up C/o insomnia today--has trouble falling and staying asleep, explains this is not related to taking Adderall as she has been without medication for several weeks and insomnia has not improved She also feels insomnia is related to uncontrolled anxiety--feels anxious and stressed most days  She is requesting refills of Adderall today--has been taking Adderall for ADHD since she was a teenager and diagnosed at that time with ADHD Current dose of 20 mg BID works well at controlling her symptoms--most days she only takes one dose and routinely does not take medication on the weekends when she is not working  Recently had rectal bleeding and found to have anal fissure--evaluated and treated by GI--bleeding has resolved, no further episodes  Current Medication: Outpatient Encounter Medications as of 08/15/2020  Medication Sig  . amphetamine-dextroamphetamine (ADDERALL) 20 MG tablet Take 1 tablet (20 mg total) by mouth 2 (two) times daily.  Marland Kitchen escitalopram (LEXAPRO) 5 MG tablet Take 1 tablet (5 mg total) by mouth daily.  . [DISCONTINUED] hydrOXYzine (VISTARIL) 25 MG capsule Take 1-2 tablets as needed at bedtime for sleep.  . [DISCONTINUED] norgestimate-ethinyl estradiol (ORTHO-CYCLEN) 0.25-35 MG-MCG tablet Take 1 tablet by mouth daily.   No facility-administered encounter medications on file as of 08/15/2020.    Surgical History: Past Surgical History:  Procedure Laterality Date  . CHOLECYSTECTOMY    . GALLBLADDER SURGERY  7-06    Medical History: Past Medical History:  Diagnosis Date  . ADHD   . Type AB blood, Rh negative     Family  History: Family History  Problem Relation Age of Onset  . Suicidality Father        2011  . Diabetes Maternal Grandfather   . Cancer Mother 57       hyst--? cervical    Social History   Socioeconomic History  . Marital status: Single    Spouse name: Not on file  . Number of children: 1  . Years of education: Not on file  . Highest education level: Not on file  Occupational History  . Not on file  Tobacco Use  . Smoking status: Never Smoker  . Smokeless tobacco: Never Used  Vaping Use  . Vaping Use: Never used  Substance and Sexual Activity  . Alcohol use: Yes    Comment: Rarely  . Drug use: No  . Sexual activity: Yes    Birth control/protection: None  Other Topics Concern  . Not on file  Social History Narrative  . Not on file   Social Determinants of Health   Financial Resource Strain: Not on file  Food Insecurity: Not on file  Transportation Needs: Not on file  Physical Activity: Not on file  Stress: Not on file  Social Connections: Not on file  Intimate Partner Violence: Not on file      Review of Systems  Constitutional: Negative for chills, diaphoresis and fatigue.  HENT: Negative for ear pain, postnasal drip and sinus pressure.   Eyes: Negative for photophobia, discharge, redness, itching and visual disturbance.  Respiratory: Negative for cough, shortness of breath and wheezing.   Cardiovascular: Negative for chest  pain, palpitations and leg swelling.  Gastrointestinal: Negative for abdominal pain, constipation, diarrhea, nausea and vomiting.  Genitourinary: Negative for dysuria and flank pain.  Musculoskeletal: Negative for arthralgias, back pain, gait problem and neck pain.  Skin: Negative for color change.  Allergic/Immunologic: Negative for environmental allergies and food allergies.  Neurological: Negative for dizziness and headaches.  Hematological: Does not bruise/bleed easily.  Psychiatric/Behavioral: Positive for suicidal ideas. Negative  for agitation, behavioral problems (depression) and hallucinations. The patient is nervous/anxious.     Vital Signs: BP 120/84   Pulse 80   Temp (!) 97.4 F (36.3 C)   Resp 16   Ht 5\' 3"  (1.6 m)   Wt 271 lb (122.9 kg)   SpO2 97%   BMI 48.01 kg/m    Physical Exam Vitals reviewed.  Constitutional:      Appearance: Normal appearance. She is obese.  Cardiovascular:     Rate and Rhythm: Normal rate and regular rhythm.     Pulses: Normal pulses.     Heart sounds: Normal heart sounds.  Pulmonary:     Effort: Pulmonary effort is normal.     Breath sounds: Normal breath sounds.  Abdominal:     General: Abdomen is flat.  Musculoskeletal:        General: Normal range of motion.     Cervical back: Normal range of motion.  Skin:    General: Skin is warm.  Neurological:     General: No focal deficit present.     Mental Status: She is alert and oriented to person, place, and time. Mental status is at baseline.  Psychiatric:        Mood and Affect: Mood normal.        Behavior: Behavior normal.        Thought Content: Thought content normal.        Judgment: Judgment normal.    Assessment/Plan: 1. Abnormal weight gain Will review labs Consider PSG  2. Attention deficit disorder (ADD) in adult May continue with current dose of Adderall Encouraged to take one tablet and only second dose as needed and to continue skipping doses when not working - amphetamine-dextroamphetamine (ADDERALL) 20 MG tablet; Take 1 tablet (20 mg total) by mouth 2 (two) times daily.  Dispense: 60 tablet; Refill: 0  3. GAD (generalized anxiety disorder) Start low dose Lexapro for anxiety symptoms Once anxiety better controlled assess symptoms of insomnia - escitalopram (LEXAPRO) 5 MG tablet; Take 1 tablet (5 mg total) by mouth daily.  Dispense: 30 tablet; Refill: 1  4. High risk medication use - Comprehensive Metabolic Panel (CMET) - Lipid Panel With LDL/HDL Ratio  5. Other fatigue - CBC  w/Diff/Platelet - TSH + free T4 - B12 - Vitamin D (25 hydroxy)  General Counseling: Anavey verbalizes understanding of the findings of todays visit and agrees with plan of treatment. I have discussed any further diagnostic evaluation that may be needed or ordered today. We also reviewed her medications today. she has been encouraged to call the office with any questions or concerns that should arise related to todays visit.  [place] Controlled Substance Database was reviewed by me for overdose risk score (ORS)  Orders Placed This Encounter  Procedures  . CBC w/Diff/Platelet  . Comprehensive Metabolic Panel (CMET)  . Lipid Panel With LDL/HDL Ratio  . TSH + free T4  . B12  . Vitamin D (25 hydroxy)    Meds ordered this encounter  Medications  . escitalopram (LEXAPRO) 5 MG tablet  Sig: Take 1 tablet (5 mg total) by mouth daily.    Dispense:  30 tablet    Refill:  1  . DISCONTD: hydrOXYzine (VISTARIL) 25 MG capsule    Sig: Take 1-2 tablets as needed at bedtime for sleep.    Dispense:  30 capsule    Refill:  0  . amphetamine-dextroamphetamine (ADDERALL) 20 MG tablet    Sig: Take 1 tablet (20 mg total) by mouth 2 (two) times daily.    Dispense:  60 tablet    Refill:  0    Time spent: 30 Minutes Time spent includes review of chart, medications, test results and follow-up plan with the patient.  This patient was seen by Leeanne Deed AGNP-C in Collaboration with Dr Lyndon Code as a part of collaborative care agreement     Lubertha Basque. Mercadez Heitman AGNP-C Internal medicine

## 2020-08-17 ENCOUNTER — Other Ambulatory Visit: Payer: Self-pay | Admitting: Hospice and Palliative Medicine

## 2020-08-17 ENCOUNTER — Encounter: Payer: Self-pay | Admitting: Hospice and Palliative Medicine

## 2020-08-17 LAB — CBC WITH DIFFERENTIAL/PLATELET
Basophils Absolute: 0 10*3/uL (ref 0.0–0.2)
Basos: 1 %
EOS (ABSOLUTE): 0.2 10*3/uL (ref 0.0–0.4)
Eos: 3 %
Hematocrit: 40.8 % (ref 34.0–46.6)
Hemoglobin: 13.9 g/dL (ref 11.1–15.9)
Immature Grans (Abs): 0 10*3/uL (ref 0.0–0.1)
Immature Granulocytes: 0 %
Lymphocytes Absolute: 1.5 10*3/uL (ref 0.7–3.1)
Lymphs: 21 %
MCH: 30.8 pg (ref 26.6–33.0)
MCHC: 34.1 g/dL (ref 31.5–35.7)
MCV: 91 fL (ref 79–97)
Monocytes Absolute: 0.5 10*3/uL (ref 0.1–0.9)
Monocytes: 7 %
Neutrophils Absolute: 4.9 10*3/uL (ref 1.4–7.0)
Neutrophils: 68 %
Platelets: 263 10*3/uL (ref 150–450)
RBC: 4.51 x10E6/uL (ref 3.77–5.28)
RDW: 13.2 % (ref 11.7–15.4)
WBC: 7.2 10*3/uL (ref 3.4–10.8)

## 2020-08-17 LAB — LIPID PANEL WITH LDL/HDL RATIO
Cholesterol, Total: 212 mg/dL — ABNORMAL HIGH (ref 100–199)
HDL: 60 mg/dL (ref >39)
LDL Chol Calc (NIH): 130 mg/dL — ABNORMAL HIGH (ref 0–99)
LDL/HDL Ratio: 2.2 ratio (ref 0.0–3.2)
Triglycerides: 126 mg/dL (ref 0–149)
VLDL Cholesterol Cal: 22 mg/dL (ref 5–40)

## 2020-08-17 LAB — COMPREHENSIVE METABOLIC PANEL
ALT: 25 IU/L (ref 0–32)
AST: 19 IU/L (ref 0–40)
Albumin/Globulin Ratio: 1.4 (ref 1.2–2.2)
Albumin: 4.3 g/dL (ref 3.8–4.8)
Alkaline Phosphatase: 70 IU/L (ref 44–121)
BUN/Creatinine Ratio: 16 (ref 9–23)
BUN: 12 mg/dL (ref 6–20)
Bilirubin Total: 0.4 mg/dL (ref 0.0–1.2)
CO2: 21 mmol/L (ref 20–29)
Calcium: 8.9 mg/dL (ref 8.7–10.2)
Chloride: 102 mmol/L (ref 96–106)
Creatinine, Ser: 0.77 mg/dL (ref 0.57–1.00)
GFR calc Af Amer: 115 mL/min/{1.73_m2} (ref >59)
GFR calc non Af Amer: 100 mL/min/{1.73_m2} (ref >59)
Globulin, Total: 3 g/dL (ref 1.5–4.5)
Glucose: 89 mg/dL (ref 65–99)
Potassium: 4.8 mmol/L (ref 3.5–5.2)
Sodium: 140 mmol/L (ref 134–144)
Total Protein: 7.3 g/dL (ref 6.0–8.5)

## 2020-08-17 LAB — TSH+FREE T4
Free T4: 1.01 ng/dL (ref 0.82–1.77)
TSH: 2.47 u[IU]/mL (ref 0.450–4.500)

## 2020-08-17 LAB — VITAMIN B12: Vitamin B-12: 1176 pg/mL (ref 232–1245)

## 2020-08-17 LAB — VITAMIN D 25 HYDROXY (VIT D DEFICIENCY, FRACTURES): Vit D, 25-Hydroxy: 21.3 ng/mL — ABNORMAL LOW (ref 30.0–100.0)

## 2020-08-17 MED ORDER — VITAMIN D (ERGOCALCIFEROL) 1.25 MG (50000 UNIT) PO CAPS
50000.0000 [IU] | ORAL_CAPSULE | ORAL | 0 refills | Status: DC
Start: 2020-08-17 — End: 2020-09-19

## 2020-08-17 MED ORDER — TRAZODONE HCL 50 MG PO TABS: 25.0000 mg | ORAL_TABLET | Freq: Every evening | ORAL | 0 refills | Status: DC | PRN

## 2020-08-17 NOTE — Progress Notes (Signed)
Called and discussed labs with patient--start on weekly vitamin D as levels remain low and has been taking OTC daily. Discussed slightly elevated lipid panel--will discuss in more detail at follow-up visit.

## 2020-08-18 ENCOUNTER — Encounter: Payer: Self-pay | Admitting: Hospice and Palliative Medicine

## 2020-09-03 ENCOUNTER — Ambulatory Visit: Payer: Medicaid Other | Admitting: Gastroenterology

## 2020-09-05 ENCOUNTER — Encounter: Payer: Self-pay | Admitting: Hospice and Palliative Medicine

## 2020-09-05 ENCOUNTER — Other Ambulatory Visit: Payer: Self-pay | Admitting: Hospice and Palliative Medicine

## 2020-09-05 MED ORDER — AZITHROMYCIN 250 MG PO TABS
ORAL_TABLET | ORAL | 0 refills | Status: DC
Start: 2020-09-05 — End: 2020-09-19

## 2020-09-07 ENCOUNTER — Other Ambulatory Visit: Payer: Self-pay | Admitting: Hospice and Palliative Medicine

## 2020-09-07 DIAGNOSIS — F411 Generalized anxiety disorder: Secondary | ICD-10-CM

## 2020-09-08 ENCOUNTER — Other Ambulatory Visit: Payer: Self-pay | Admitting: Hospice and Palliative Medicine

## 2020-09-19 ENCOUNTER — Ambulatory Visit: Payer: Medicaid Other | Admitting: Hospice and Palliative Medicine

## 2020-09-19 ENCOUNTER — Encounter: Payer: Self-pay | Admitting: Hospice and Palliative Medicine

## 2020-09-19 VITALS — BP 112/78 | HR 88 | Temp 98.5°F | Resp 16 | Ht 63.0 in | Wt 264.0 lb

## 2020-09-19 DIAGNOSIS — H1010 Acute atopic conjunctivitis, unspecified eye: Secondary | ICD-10-CM

## 2020-09-19 DIAGNOSIS — J309 Allergic rhinitis, unspecified: Secondary | ICD-10-CM | POA: Diagnosis not present

## 2020-09-19 DIAGNOSIS — F988 Other specified behavioral and emotional disorders with onset usually occurring in childhood and adolescence: Secondary | ICD-10-CM | POA: Diagnosis not present

## 2020-09-19 DIAGNOSIS — F5101 Primary insomnia: Secondary | ICD-10-CM

## 2020-09-19 DIAGNOSIS — F411 Generalized anxiety disorder: Secondary | ICD-10-CM

## 2020-09-19 MED ORDER — AZELASTINE HCL 0.1 % NA SOLN: 1.0000 | Freq: Two times a day (BID) | NASAL | 1 refills | Status: DC

## 2020-09-19 MED ORDER — ESCITALOPRAM OXALATE 10 MG PO TABS: 10.0000 mg | ORAL_TABLET | Freq: Every day | ORAL | 1 refills | Status: DC

## 2020-09-19 MED ORDER — TRAZODONE HCL 50 MG PO TABS: 25.0000 mg | ORAL_TABLET | Freq: Every evening | ORAL | 1 refills | Status: DC | PRN

## 2020-09-19 MED ORDER — CETIRIZINE HCL 10 MG PO TABS: 10.0000 mg | ORAL_TABLET | Freq: Every day | ORAL | 3 refills | Status: DC

## 2020-09-19 MED ORDER — AMPHETAMINE-DEXTROAMPHET ER 30 MG PO CP24: 30.0000 mg | ORAL_CAPSULE | ORAL | 0 refills | Status: DC

## 2020-09-19 MED ORDER — AMPHETAMINE-DEXTROAMPHETAMINE 10 MG PO TABS: 10.0000 mg | ORAL_TABLET | Freq: Every day | ORAL | 0 refills | Status: DC | PRN

## 2020-09-19 NOTE — Progress Notes (Signed)
[facility] [address] [place], [place] [address]  Internal MEDICINE  Office Visit Note  Patient Name: Paula Sullivan  [id number]  [id number]  Date of Service: 09/21/2020  Chief Complaint  Patient presents with  . Anxiety    1 month f-up  . abnormal weight gain  . ADD  . Ear Pain    Right ear pain still there from sinus infection    HPI Patient is here for routine follow-up Started on Lexapro at last visit for uncontrolled GAD, feels that medication has improved her overall anxiety, tolerating medication well without negative side effects, wanting to try an increased dose Sleep has also improved since starting Lexapro but continues to struggle some nights with falling asleep Continues to take Adderall BID as needed, frequently only takes single dose and skips medications on off days and weekends--finds it difficult with BID dosing as the time her first dose is starting to wear off it is too late to take second dose as she does not want to interrupt her sleep  C/o ongoing allergy symptoms, PND, rhinorrhea, watery eyes and ear pain Treated with ZPAK, symptoms did improve Has struggled with allergies for many years, currently taking Zyrtec and Flonase--has been using combination for many years Has considered allergy testing in the past   Current Medication: Outpatient Encounter Medications as of 09/19/2020  Medication Sig  . amphetamine-dextroamphetamine (ADDERALL XR) 30 MG 24 hr capsule Take 1 capsule (30 mg total) by mouth every morning.  Marland Kitchen amphetamine-dextroamphetamine (ADDERALL) 10 MG tablet Take 1 tablet (10 mg total) by mouth daily as needed.  Marland Kitchen azelastine (ASTELIN) 0.1 % nasal spray Place 1 spray into both nostrils 2 (two) times daily. Use in each nostril as directed  . cetirizine (ZYRTEC) 10 MG tablet Take 1 tablet (10 mg total) by mouth daily.  Marland Kitchen escitalopram (LEXAPRO) 10 MG tablet Take 1 tablet (10 mg total) by mouth daily.  . [DISCONTINUED]  amphetamine-dextroamphetamine (ADDERALL) 20 MG tablet Take 1 tablet (20 mg total) by mouth 2 (two) times daily.  . [DISCONTINUED] escitalopram (LEXAPRO) 5 MG tablet TAKE 1 TABLET (5 MG TOTAL) BY MOUTH DAILY.  . [DISCONTINUED] traZODone (DESYREL) 50 MG tablet TAKE 1/2 TO 1 TABLET BY MOUTH AT BEDTIME AS NEEDED FOR SLEEP.  . [DISCONTINUED] Vitamin D, Ergocalciferol, (DRISDOL) 1.25 MG (50000 UNIT) CAPS capsule Take 1 capsule (50,000 Units total) by mouth every 7 (seven) days.  . traZODone (DESYREL) 50 MG tablet Take 0.5-1 tablets (25-50 mg total) by mouth at bedtime as needed. for sleep  . [DISCONTINUED] azithromycin (ZITHROMAX Z-PAK) 250 MG tablet Take two 250 mg tablets on day one followed by one 250 mg tablet each day for four days. (Patient not taking: Reported on 09/19/2020)  . [DISCONTINUED] norgestimate-ethinyl estradiol (ORTHO-CYCLEN) 0.25-35 MG-MCG tablet Take 1 tablet by mouth daily.   No facility-administered encounter medications on file as of 09/19/2020.    Surgical History: Past Surgical History:  Procedure Laterality Date  . CHOLECYSTECTOMY    . GALLBLADDER SURGERY  7-06    Medical History: Past Medical History:  Diagnosis Date  . ADHD   . Type AB blood, Rh negative     Family History: Family History  Problem Relation Age of Onset  . Suicidality Father        2011  . Diabetes Maternal Grandfather   . Cancer Mother 30       hyst--? cervical    Social History   Socioeconomic History  . Marital status: Single  Spouse name: Not on file  . Number of children: 1  . Years of education: Not on file  . Highest education level: Not on file  Occupational History  . Not on file  Tobacco Use  . Smoking status: Never Smoker  . Smokeless tobacco: Never Used  Vaping Use  . Vaping Use: Never used  Substance and Sexual Activity  . Alcohol use: Yes    Comment: Rarely  . Drug use: No  . Sexual activity: Yes    Birth control/protection: None  Other Topics Concern  . Not  on file  Social History Narrative  . Not on file   Social Determinants of Health   Financial Resource Strain: Not on file  Food Insecurity: Not on file  Transportation Needs: Not on file  Physical Activity: Not on file  Stress: Not on file  Social Connections: Not on file  Intimate Partner Violence: Not on file   Review of Systems  Constitutional: Negative for chills, diaphoresis and fatigue.  HENT: Positive for postnasal drip, rhinorrhea and sinus pressure. Negative for ear pain.   Eyes: Negative for photophobia, discharge, redness, itching and visual disturbance.  Respiratory: Negative for cough, shortness of breath and wheezing.   Cardiovascular: Negative for chest pain, palpitations and leg swelling.  Gastrointestinal: Negative for abdominal pain, constipation, diarrhea, nausea and vomiting.  Genitourinary: Negative for dysuria and flank pain.  Musculoskeletal: Negative for arthralgias, back pain, gait problem and neck pain.  Skin: Negative for color change.  Allergic/Immunologic: Negative for environmental allergies and food allergies.  Neurological: Negative for dizziness and headaches.  Hematological: Does not bruise/bleed easily.  Psychiatric/Behavioral: Positive for sleep disturbance. Negative for agitation, behavioral problems (depression) and hallucinations. The patient is nervous/anxious.     Vital Signs: BP 112/78   Pulse 88   Temp 98.5 F (36.9 C)   Resp 16   Ht 5\' 3"  (1.6 m)   Wt 264 lb (119.7 kg)   SpO2 98%   BMI 46.77 kg/m    Physical Exam Vitals reviewed.  Constitutional:      Appearance: Normal appearance. She is obese.  Cardiovascular:     Rate and Rhythm: Normal rate and regular rhythm.     Pulses: Normal pulses.     Heart sounds: Normal heart sounds.  Pulmonary:     Effort: Pulmonary effort is normal.     Breath sounds: Normal breath sounds.  Abdominal:     General: Abdomen is flat.     Palpations: Abdomen is soft.  Musculoskeletal:         General: Normal range of motion.     Cervical back: Normal range of motion.  Skin:    General: Skin is warm.  Neurological:     General: No focal deficit present.     Mental Status: She is alert and oriented to person, place, and time. Mental status is at baseline.  Psychiatric:        Mood and Affect: Mood normal.        Behavior: Behavior normal.        Thought Content: Thought content normal.        Judgment: Judgment normal.    Assessment/Plan: 1. Allergic rhinoconjunctivitis Add Astelin to regmien Due to ongoing chronic symptoms and severity will schedule for allergy testing - azelastine (ASTELIN) 0.1 % nasal spray; Place 1 spray into both nostrils 2 (two) times daily. Use in each nostril as directed  Dispense: 30 mL; Refill: 1 - Allergy Test - cetirizine (ZYRTEC)  10 MG tablet; Take 1 tablet (10 mg total) by mouth daily.  Dispense: 90 tablet; Refill: 3  2. GAD (generalized anxiety disorder) Increase dose of Lexapro to 10 mg for further control of GAD - escitalopram (LEXAPRO) 10 MG tablet; Take 1 tablet (10 mg total) by mouth daily.  Dispense: 90 tablet; Refill: 1  3. Attention deficit disorder (ADD) in adult Will try XR Adderall for better control of her symptoms, may take IR as needed - amphetamine-dextroamphetamine (ADDERALL XR) 30 MG 24 hr capsule; Take 1 capsule (30 mg total) by mouth every morning.  Dispense: 30 capsule; Refill: 0 - amphetamine-dextroamphetamine (ADDERALL) 10 MG tablet; Take 1 tablet (10 mg total) by mouth daily as needed.  Dispense: 30 tablet; Refill: 0  4. Morbid obesity (HCC) BMI 46 Consider PSG Obesity Counseling: Risk Assessment: An assessment of behavioral risk factors was made today and includes lack of exercise sedentary lifestyle, lack of portion control and poor dietary habits.  Risk Modification Advice: She was counseled on portion control guidelines. Restricting daily caloric intake to 1800. The detrimental long term effects of obesity on  her health and ongoing poor compliance was also discussed with the patient.  5. Primary insomnia Try trazodone as needed to help with insomnia, advised to avoid adderall dosing after 2pm to avoid risk of causing insomnia - traZODone (DESYREL) 50 MG tablet; Take 0.5-1 tablets (25-50 mg total) by mouth at bedtime as needed. for sleep  Dispense: 90 tablet; Refill: 1  General Counseling: Paula Sullivan verbalizes understanding of the findings of todays visit and agrees with plan of treatment. I have discussed any further diagnostic evaluation that may be needed or ordered today. We also reviewed her medications today. she has been encouraged to call the office with any questions or concerns that should arise related to todays visit.    Orders Placed This Encounter  Procedures  . Allergy Test    Meds ordered this encounter  Medications  . escitalopram (LEXAPRO) 10 MG tablet    Sig: Take 1 tablet (10 mg total) by mouth daily.    Dispense:  90 tablet    Refill:  1  . amphetamine-dextroamphetamine (ADDERALL XR) 30 MG 24 hr capsule    Sig: Take 1 capsule (30 mg total) by mouth every morning.    Dispense:  30 capsule    Refill:  0  . amphetamine-dextroamphetamine (ADDERALL) 10 MG tablet    Sig: Take 1 tablet (10 mg total) by mouth daily as needed.    Dispense:  30 tablet    Refill:  0    Take 30 mg XR daily and may take 10 mg IR as needed daily.  . traZODone (DESYREL) 50 MG tablet    Sig: Take 0.5-1 tablets (25-50 mg total) by mouth at bedtime as needed. for sleep    Dispense:  90 tablet    Refill:  1  . azelastine (ASTELIN) 0.1 % nasal spray    Sig: Place 1 spray into both nostrils 2 (two) times daily. Use in each nostril as directed    Dispense:  30 mL    Refill:  1  . cetirizine (ZYRTEC) 10 MG tablet    Sig: Take 1 tablet (10 mg total) by mouth daily.    Dispense:  90 tablet    Refill:  3    Time spent: 30 Minutes Time spent includes review of chart, medications, test results and  follow-up plan with the patient.  This patient was seen by Leeanne Deed AGNP-C  in Collaboration with Dr Lyndon Code as a part of collaborative care agreement     Lubertha Basque. Harris AGNP-C Internal medicine

## 2020-09-21 ENCOUNTER — Encounter: Payer: Self-pay | Admitting: Hospice and Palliative Medicine

## 2020-09-25 ENCOUNTER — Ambulatory Visit: Payer: Medicaid Other

## 2020-09-27 ENCOUNTER — Ambulatory Visit: Payer: Medicaid Other

## 2020-09-28 ENCOUNTER — Other Ambulatory Visit: Payer: Self-pay

## 2020-09-28 ENCOUNTER — Ambulatory Visit (INDEPENDENT_AMBULATORY_CARE_PROVIDER_SITE_OTHER): Payer: BC Managed Care – PPO

## 2020-09-28 DIAGNOSIS — Z23 Encounter for immunization: Secondary | ICD-10-CM | POA: Diagnosis not present

## 2020-09-28 NOTE — Progress Notes (Signed)
Patient presents today for Gardisil #3. LMP 09/22/20 . Given IM Left Deltoid. Patient tolerated well.

## 2020-10-11 ENCOUNTER — Other Ambulatory Visit: Payer: Self-pay | Admitting: Hospice and Palliative Medicine

## 2020-10-11 DIAGNOSIS — H1010 Acute atopic conjunctivitis, unspecified eye: Secondary | ICD-10-CM

## 2020-10-11 DIAGNOSIS — J309 Allergic rhinitis, unspecified: Secondary | ICD-10-CM

## 2020-10-12 ENCOUNTER — Other Ambulatory Visit: Payer: Self-pay

## 2020-10-12 ENCOUNTER — Encounter: Payer: Self-pay | Admitting: Physician Assistant

## 2020-10-12 ENCOUNTER — Ambulatory Visit: Payer: Medicaid Other | Admitting: Physician Assistant

## 2020-10-12 DIAGNOSIS — F988 Other specified behavioral and emotional disorders with onset usually occurring in childhood and adolescence: Secondary | ICD-10-CM | POA: Diagnosis not present

## 2020-10-12 DIAGNOSIS — Z6841 Body Mass Index (BMI) 40.0 and over, adult: Secondary | ICD-10-CM

## 2020-10-12 DIAGNOSIS — F411 Generalized anxiety disorder: Secondary | ICD-10-CM

## 2020-10-12 DIAGNOSIS — Z79899 Other long term (current) drug therapy: Secondary | ICD-10-CM | POA: Diagnosis not present

## 2020-10-12 DIAGNOSIS — F5101 Primary insomnia: Secondary | ICD-10-CM | POA: Diagnosis not present

## 2020-10-12 HISTORY — PX: SLEEVE GASTROPLASTY: SHX1101

## 2020-10-12 LAB — POCT URINE DRUG SCREEN
Methylenedioxyamphetamine: NOT DETECTED
POC Amphetamine UR: POSITIVE — AB
POC BENZODIAZEPINES UR: NOT DETECTED
POC Barbiturate UR: NOT DETECTED
POC Cocaine UR: NOT DETECTED
POC Ecstasy UR: NOT DETECTED
POC Marijuana UR: NOT DETECTED
POC Methadone UR: NOT DETECTED
POC Methamphetamine UR: NOT DETECTED
POC Opiate Ur: NOT DETECTED
POC Oxycodone UR: NOT DETECTED
POC PHENCYCLIDINE UR: NOT DETECTED
POC TRICYCLICS UR: NOT DETECTED

## 2020-10-12 MED ORDER — AMPHETAMINE-DEXTROAMPHETAMINE 20 MG PO TABS: 20.0000 mg | ORAL_TABLET | Freq: Two times a day (BID) | ORAL | 0 refills | Status: DC

## 2020-10-12 MED ORDER — AMPHETAMINE-DEXTROAMPHETAMINE 20 MG PO TABS
20.0000 mg | ORAL_TABLET | Freq: Two times a day (BID) | ORAL | 0 refills | Status: DC
Start: 2020-10-12 — End: 2020-11-14

## 2020-10-12 NOTE — Progress Notes (Signed)
[facility] [address] [place], [place] [address]  Internal MEDICINE  Office Visit Note  Patient Name: Paula Sullivan  [id number]  [id number]  Date of Service: 10/12/2020  Chief Complaint  Patient presents with  . Acute Visit    Refill request      HPI Pt is here for a medication refill.  -Pt changed to 30mg  ER and 10mg  IR adderall last visit, but it is keeping her up at night. Even without the 10mg  IR she would be kept up. -She gets up at 5:30 and starts work at 7 and if she takes her second dose at lunch she is good and able to sleep. She was changed last visit because she kept forgetting to take her second dose of the 20mg  tab until later in afternoon and would affect sleep then.  -She wants to go back to 20mg  BID and will set alarm to take at lunch to prevent forgetting and taking too late in evening. Also suggested she trial taking 20mg  in AM and her few remaining 10mg  IR tabs at lunch and see if that dose works for her, otherwise may take 20mg  BID. -Works in for and works from home. -Lexapro is helping and trazodone at night helps when she's not on the 30mg  adderall. -Discussed possible sleep study in future. She is unaware if she really snores, and has not noticed that she wakes gasping or with morning headaches. She does report feeling sleepy during the day on the weekends when she skips her adderall. -HR fast on intake, but normalized on exam  Current Medication:  Outpatient Encounter Medications as of 10/12/2020  Medication Sig  . amphetamine-dextroamphetamine (ADDERALL) 20 MG tablet Take 1 tablet (20 mg total) by mouth 2 (two) times daily.  azelastine (ASTELIN) 0.1 % nasal spray PLACE 1 SPRAY INTO BOTH NOSTRILS 2 (TWO) TIMES DAILY. USE IN EACH NOSTRIL AS DIRECTED  . cetirizine (ZYRTEC) 10 MG tablet Take 1 tablet (10 mg total) by mouth daily.  escitalopram (LEXAPRO) 10 MG tablet Take 1 tablet (10 mg total) by mouth daily.  . traZODone  (DESYREL) 50 MG tablet Take 0.5-1 tablets (25-50 mg total) by mouth at bedtime as needed. for sleep  . [DISCONTINUED] amphetamine-dextroamphetamine (ADDERALL XR) 30 MG 24 hr capsule Take 1 capsule (30 mg total) by mouth every morning.  . [DISCONTINUED] amphetamine-dextroamphetamine (ADDERALL) 10 MG tablet Take 1 tablet (10 mg total) by mouth daily as needed.  . [DISCONTINUED] norgestimate-ethinyl estradiol (ORTHO-CYCLEN) 0.25-35 MG-MCG tablet Take 1 tablet by mouth daily.   No facility-administered encounter medications on file as of 10/12/2020.      Medical History: Past Medical History:  Diagnosis Date  . ADHD   . Type AB blood, Rh negative      Vital Signs: BP 130/86   Pulse 99   Temp (!) 97.5 F (36.4 C)   Resp 16   Ht 5\' 3"  (1.6 m)   Wt 261 lb (118.4 kg)   SpO2 97%   BMI 46.23 kg/m    Review of Systems  Constitutional: Negative for chills, fatigue and unexpected weight change.  HENT: Negative for congestion, postnasal drip, rhinorrhea, sneezing and sore throat.   Eyes: Negative for redness.  Respiratory: Negative for cough, chest tightness and shortness of breath.   Cardiovascular: Negative for chest pain and palpitations.  Gastrointestinal: Negative for abdominal pain, constipation, diarrhea, nausea and vomiting.  Genitourinary: Negative for dysuria and frequency.  Musculoskeletal: Negative for arthralgias, back pain,  joint swelling and neck pain.  Skin: Negative for rash.  Neurological: Negative.  Negative for tremors and numbness.  Hematological: Negative for adenopathy. Does not bruise/bleed easily.  Psychiatric/Behavioral: Positive for decreased concentration and sleep disturbance. Negative for behavioral problems (Depression) and suicidal ideas. The patient is not nervous/anxious.     Physical Exam Constitutional:      General: She is not in acute distress.    Appearance: She is well-developed. She is obese. She is not diaphoretic.  HENT:     Head:  Normocephalic and atraumatic.     Mouth/Throat:     Pharynx: No oropharyngeal exudate.  Eyes:     Pupils: Pupils are equal, round, and reactive to light.  Neck:     Thyroid: No thyromegaly.     Vascular: No JVD.     Trachea: No tracheal deviation.  Cardiovascular:     Rate and Rhythm: Normal rate and regular rhythm.     Heart sounds: Normal heart sounds. No murmur heard. No friction rub. No gallop.   Pulmonary:     Effort: Pulmonary effort is normal. No respiratory distress.     Breath sounds: No wheezing or rales.  Chest:     Chest wall: No tenderness.  Abdominal:     General: Bowel sounds are normal.     Palpations: Abdomen is soft.  Musculoskeletal:        General: Normal range of motion.     Cervical back: Normal range of motion and neck supple.  Lymphadenopathy:     Cervical: No cervical adenopathy.  Skin:    General: Skin is warm and dry.  Neurological:     Mental Status: She is alert and oriented to person, place, and time.     Cranial Nerves: No cranial nerve deficit.  Psychiatric:        Behavior: Behavior normal.        Thought Content: Thought content normal.        Judgment: Judgment normal.       Assessment/Plan: 1. Attention deficit disorder (ADD) in adult Pt reports 30mg  ER with 10mg  IR did not work well for her and interrupted her sleep. She tried just taking the 30mg  ER on its own and still had sleep disturbed. Would like to go back to 20mg  BID and will set an alarm so that she remembers second dose at lunch rather than waiting too late in PM and it affecting sleep. Recomended she try 20mg  in AM and 10mg  at lunch to see if it worked well for her. - amphetamine-dextroamphetamine (ADDERALL) 20 MG tablet; Take 1 tablet (20 mg total) by mouth 2 (two) times daily.  Dispense: 60 tablet; Refill: 0  2. GAD (generalized anxiety disorder) Doing well on Lexapro.  3. Primary insomnia Continue trazodone, reports benefit before the 30mg  ER adderall started impacted  sleep. Will also consider CPAP  4. Encounter for long-term (current) use of high-risk medication - POCT Urine Drug Screen + Amp  5. Morbid obesity with BMI of 45.0-49.9, adult (HCC) Consider PSG in future Obesity Counseling: Risk Assessment: An assessment of behavioral risk factors was made today and includes lack of exercise sedentary lifestyle, lack of portion control and poor dietary habits.  Risk Modification Advice: She was counseled on portion control guidelines. Restricting daily caloric intake to 1800. The detrimental long term effects of obesity on her health and ongoing poor compliance was also discussed with the patient.     General Counseling: Wadie verbalizes understanding of the findings  of todays visit and agrees with plan of treatment. I have discussed any further diagnostic evaluation that may be needed or ordered today. We also reviewed her medications today. she has been encouraged to call the office with any questions or concerns that should arise related to todays visit.    Counseling:    Orders Placed This Encounter  Procedures  . POCT Urine Drug Screen    Meds ordered this encounter  Medications  . amphetamine-dextroamphetamine (ADDERALL) 20 MG tablet    Sig: Take 1 tablet (20 mg total) by mouth 2 (two) times daily.    Dispense:  60 tablet    Refill:  0    Time spent:30 Minutes

## 2020-10-12 NOTE — Patient Instructions (Signed)

## 2020-10-15 ENCOUNTER — Encounter: Payer: Self-pay | Admitting: Hospice and Palliative Medicine

## 2020-10-15 ENCOUNTER — Other Ambulatory Visit: Payer: Self-pay

## 2020-10-16 ENCOUNTER — Ambulatory Visit: Payer: Medicaid Other | Admitting: Hospice and Palliative Medicine

## 2020-11-13 ENCOUNTER — Other Ambulatory Visit: Payer: Self-pay | Admitting: Obstetrics and Gynecology

## 2020-11-13 DIAGNOSIS — Z30011 Encounter for initial prescription of contraceptive pills: Secondary | ICD-10-CM

## 2020-11-14 ENCOUNTER — Encounter: Payer: Self-pay | Admitting: Hospice and Palliative Medicine

## 2020-11-14 ENCOUNTER — Other Ambulatory Visit: Payer: Self-pay | Admitting: Hospice and Palliative Medicine

## 2020-11-14 DIAGNOSIS — F988 Other specified behavioral and emotional disorders with onset usually occurring in childhood and adolescence: Secondary | ICD-10-CM

## 2020-11-14 DIAGNOSIS — F411 Generalized anxiety disorder: Secondary | ICD-10-CM

## 2020-11-14 MED ORDER — AMPHETAMINE-DEXTROAMPHETAMINE 20 MG PO TABS: 20.0000 mg | ORAL_TABLET | Freq: Two times a day (BID) | ORAL | 0 refills | Status: DC

## 2020-11-14 MED ORDER — ESCITALOPRAM OXALATE 10 MG PO TABS: 10.0000 mg | ORAL_TABLET | Freq: Every day | ORAL | 1 refills | Status: DC

## 2020-11-16 ENCOUNTER — Other Ambulatory Visit: Payer: Self-pay | Admitting: Obstetrics and Gynecology

## 2020-11-16 DIAGNOSIS — Z30011 Encounter for initial prescription of contraceptive pills: Secondary | ICD-10-CM

## 2020-12-17 ENCOUNTER — Other Ambulatory Visit: Payer: Self-pay

## 2020-12-17 ENCOUNTER — Ambulatory Visit: Payer: Medicaid Other | Admitting: Physician Assistant

## 2020-12-17 ENCOUNTER — Encounter: Payer: Self-pay | Admitting: Physician Assistant

## 2020-12-17 DIAGNOSIS — F5101 Primary insomnia: Secondary | ICD-10-CM

## 2020-12-17 DIAGNOSIS — F988 Other specified behavioral and emotional disorders with onset usually occurring in childhood and adolescence: Secondary | ICD-10-CM

## 2020-12-17 DIAGNOSIS — F411 Generalized anxiety disorder: Secondary | ICD-10-CM | POA: Diagnosis not present

## 2020-12-17 DIAGNOSIS — R5383 Other fatigue: Secondary | ICD-10-CM

## 2020-12-17 DIAGNOSIS — Z903 Acquired absence of stomach [part of]: Secondary | ICD-10-CM | POA: Diagnosis not present

## 2020-12-17 MED ORDER — AMPHETAMINE-DEXTROAMPHETAMINE 20 MG PO TABS: 20.0000 mg | ORAL_TABLET | Freq: Two times a day (BID) | ORAL | 0 refills | Status: DC

## 2020-12-17 NOTE — Progress Notes (Signed)
[facility] [address] [place], [place] [address]  Internal MEDICINE  Office Visit Note  Patient Name: Paula Sullivan  [id number]  [id number]  Date of Service: 12/17/2020  Chief Complaint  Patient presents with  . ADHD    refills  . Follow-up  . Quality Metric Gaps    pneumovax    HPI Pt is here for routine follow up -She had a Gastric sleeve surgery in April done in [place] and has a list of follow up labs to have done here. Able to eat solid foods now, just in small increments of 4 oz. States she is doing well since procedure. She has lost 29lbs since last visit. -She is doing well on current dose of Adderall and is taking 20mg  BID with second dose around lunch--no later. -She is sleeping well and continues to take trazodone at night. Lexapro in AM and feels well controlled.  Current Medication: Outpatient Encounter Medications as of 12/17/2020  Medication Sig  . [START ON 01/16/2021] amphetamine-dextroamphetamine (ADDERALL) 20 MG tablet Take 1 tablet (20 mg total) by mouth 2 (two) times daily.  03/19/2021 ON 02/16/2021] amphetamine-dextroamphetamine (ADDERALL) 20 MG tablet Take 1 tablet (20 mg total) by mouth 2 (two) times daily.  04/18/2021 azelastine (ASTELIN) 0.1 % nasal spray PLACE 1 SPRAY INTO BOTH NOSTRILS 2 (TWO) TIMES DAILY. USE IN EACH NOSTRIL AS DIRECTED  . cetirizine (ZYRTEC) 10 MG tablet Take 1 tablet (10 mg total) by mouth daily.  Marland Kitchen escitalopram (LEXAPRO) 10 MG tablet Take 1 tablet (10 mg total) by mouth daily.  . traZODone (DESYREL) 50 MG tablet Take 0.5-1 tablets (25-50 mg total) by mouth at bedtime as needed. for sleep  . [DISCONTINUED] amphetamine-dextroamphetamine (ADDERALL) 20 MG tablet Take 1 tablet (20 mg total) by mouth 2 (two) times daily.  Marland Kitchen amphetamine-dextroamphetamine (ADDERALL) 20 MG tablet Take 1 tablet (20 mg total) by mouth 2 (two) times daily.  . [DISCONTINUED] norgestimate-ethinyl estradiol (ORTHO-CYCLEN) 0.25-35 MG-MCG tablet Take 1 tablet by mouth  daily.   No facility-administered encounter medications on file as of 12/17/2020.    Surgical History: Past Surgical History:  Procedure Laterality Date  . CHOLECYSTECTOMY    . GALLBLADDER SURGERY  7-06  . SLEEVE GASTROPLASTY      Medical History: Past Medical History:  Diagnosis Date  . ADHD   . Type AB blood, Rh negative     Family History: Family History  Problem Relation Age of Onset  . Suicidality Father        2011  . Diabetes Maternal Grandfather   . Cancer Mother 78       hyst--? cervical    Social History   Socioeconomic History  . Marital status: Single    Spouse name: Not on file  . Number of children: 1  . Years of education: Not on file  . Highest education level: Not on file  Occupational History  . Not on file  Tobacco Use  . Smoking status: Never Smoker  . Smokeless tobacco: Never Used  Vaping Use  . Vaping Use: Never used  Substance and Sexual Activity  . Alcohol use: Yes    Comment: Rarely  . Drug use: No  . Sexual activity: Yes    Birth control/protection: None  Other Topics Concern  . Not on file  Social History Narrative  . Not on file   Social Determinants of Health   Financial Resource Strain: Not on file  Food Insecurity: Not on file  Transportation Needs:  Not on file  Physical Activity: Not on file  Stress: Not on file  Social Connections: Not on file  Intimate Partner Violence: Not on file      Review of Systems  Constitutional: Negative for chills, fatigue and unexpected weight change.  HENT: Negative for congestion, postnasal drip, rhinorrhea, sneezing and sore throat.   Eyes: Negative for redness.  Respiratory: Negative for cough, chest tightness and shortness of breath.   Cardiovascular: Negative for chest pain and palpitations.  Gastrointestinal: Negative for abdominal pain, constipation, diarrhea, nausea and vomiting.  Genitourinary: Negative for dysuria and frequency.  Musculoskeletal: Negative for  arthralgias, back pain, joint swelling and neck pain.  Skin: Negative for rash.  Neurological: Negative.  Negative for tremors and numbness.  Hematological: Negative for adenopathy. Does not bruise/bleed easily.  Psychiatric/Behavioral: Negative for behavioral problems (Depression), sleep disturbance and suicidal ideas. The patient is not nervous/anxious.     Vital Signs: BP 118/86   Pulse 82   Temp (!) 97.4 F (36.3 C)   Resp 16   Ht 5\' 3"  (1.6 m)   Wt 232 lb 3.2 oz (105.3 kg)   SpO2 97%   BMI 41.13 kg/m    Physical Exam Vitals and nursing note reviewed.  Constitutional:      General: She is not in acute distress.    Appearance: She is well-developed. She is obese. She is not diaphoretic.  HENT:     Head: Normocephalic and atraumatic.     Mouth/Throat:     Pharynx: No oropharyngeal exudate.  Eyes:     Pupils: Pupils are equal, round, and reactive to light.  Neck:     Thyroid: No thyromegaly.     Vascular: No JVD.     Trachea: No tracheal deviation.  Cardiovascular:     Rate and Rhythm: Normal rate and regular rhythm.     Heart sounds: Normal heart sounds. No murmur heard. No friction rub. No gallop.   Pulmonary:     Effort: Pulmonary effort is normal. No respiratory distress.     Breath sounds: No wheezing or rales.  Chest:     Chest wall: No tenderness.  Abdominal:     General: Bowel sounds are normal.     Palpations: Abdomen is soft.  Musculoskeletal:        General: Normal range of motion.     Cervical back: Normal range of motion and neck supple.  Lymphadenopathy:     Cervical: No cervical adenopathy.  Skin:    General: Skin is warm and dry.  Neurological:     Mental Status: She is alert and oriented to person, place, and time.     Cranial Nerves: No cranial nerve deficit.  Psychiatric:        Behavior: Behavior normal.        Thought Content: Thought content normal.        Judgment: Judgment normal.        Assessment/Plan: 1. Attention deficit  disorder (ADD) in adult Stable, continue Adderall 1-2 times per day as needed. - amphetamine-dextroamphetamine (ADDERALL) 20 MG tablet; Take 1 tablet (20 mg total) by mouth 2 (two) times daily.  Dispense: 60 tablet; Refill: 0 - amphetamine-dextroamphetamine (ADDERALL) 20 MG tablet; Take 1 tablet (20 mg total) by mouth 2 (two) times daily.  Dispense: 60 tablet; Refill: 0 - amphetamine-dextroamphetamine (ADDERALL) 20 MG tablet; Take 1 tablet (20 mg total) by mouth 2 (two) times daily.  Dispense: 60 tablet; Refill: 0 [place] Controlled Substance Database was  reviewed by me for overdose risk score (ORS) Refilled Controlled medications today. Reviewed risks and possible side effects associated with taking Stimulants. Combination of these drugs with other psychotropic medications could cause dizziness and drowsiness. Pt needs to Monitor symptoms and exercise caution in driving and operating heavy machinery to avoid damages to oneself, to others and to the surroundings. Patient verbalized understanding in this matter. Dependence and abuse for these drugs will be monitored closely. A Controlled substance policy and procedure is on file which allows [facility] to order a urine drug screen test at any visit. Patient understands and agrees with the plan..  2. GAD (generalized anxiety disorder) Stable, continue Lexapro  3. Primary insomnia Improved, continue trazodone  4. S/P gastric sleeve procedure Will order follow up lab work at patient's request - CBC w/Diff/Platelet - Comprehensive metabolic panel - Lipid Panel With LDL/HDL Ratio - Iron, TIBC and Ferritin Panel - B12 and Folate Panel - VITAMIN D 25 Hydroxy (Vit-D Deficiency, Fractures) - TSH + free T4 - Magnesium - Vitamin A - Vitamin E  5. Other fatigue - CBC w/Diff/Platelet - Comprehensive metabolic panel - Lipid Panel With LDL/HDL Ratio - Iron, TIBC and Ferritin Panel - B12 and Folate Panel - VITAMIN D 25 Hydroxy (Vit-D  Deficiency, Fractures) - TSH + free T4 - Magnesium - Vitamin A - Vitamin E   General Counseling: Hanako verbalizes understanding of the findings of todays visit and agrees with plan of treatment. I have discussed any further diagnostic evaluation that may be needed or ordered today. We also reviewed her medications today. she has been encouraged to call the office with any questions or concerns that should arise related to todays visit.    Orders Placed This Encounter  Procedures  . CBC w/Diff/Platelet  . Comprehensive metabolic panel  . Lipid Panel With LDL/HDL Ratio  . Iron, TIBC and Ferritin Panel  . B12 and Folate Panel  . VITAMIN D 25 Hydroxy (Vit-D Deficiency, Fractures)  . TSH + free T4  . Magnesium  . Vitamin A  . Vitamin E    Meds ordered this encounter  Medications  . amphetamine-dextroamphetamine (ADDERALL) 20 MG tablet    Sig: Take 1 tablet (20 mg total) by mouth 2 (two) times daily.    Dispense:  60 tablet    Refill:  0  . amphetamine-dextroamphetamine (ADDERALL) 20 MG tablet    Sig: Take 1 tablet (20 mg total) by mouth 2 (two) times daily.    Dispense:  60 tablet    Refill:  0    Do not fill prior to 01/16/21  . amphetamine-dextroamphetamine (ADDERALL) 20 MG tablet    Sig: Take 1 tablet (20 mg total) by mouth 2 (two) times daily.    Dispense:  60 tablet    Refill:  0    Do not refill until 02/16/21    This patient was seen by Lynn Ito, PA-C in collaboration with Dr. Beverely Risen as a part of collaborative care agreement.   Total time spent:30 Minutes Time spent includes review of chart, medications, test results, and follow up plan with the patient.      Dr Lyndon Code Internal medicine

## 2020-12-26 ENCOUNTER — Encounter: Payer: Self-pay | Admitting: Obstetrics and Gynecology

## 2020-12-26 ENCOUNTER — Other Ambulatory Visit (HOSPITAL_COMMUNITY)
Admission: RE | Admit: 2020-12-26 | Discharge: 2020-12-26 | Disposition: A | Discharge status: Home / Self Care | Payer: BLUE CROSS/BLUE SHIELD | Source: Ambulatory Visit | Attending: Obstetrics and Gynecology | Admitting: Obstetrics and Gynecology

## 2020-12-26 ENCOUNTER — Other Ambulatory Visit: Payer: Self-pay

## 2020-12-26 ENCOUNTER — Ambulatory Visit (INDEPENDENT_AMBULATORY_CARE_PROVIDER_SITE_OTHER): Payer: BC Managed Care – PPO | Admitting: Obstetrics and Gynecology

## 2020-12-26 VITALS — BP 120/80 | Ht 63.0 in | Wt 227.0 lb

## 2020-12-26 DIAGNOSIS — Z113 Encounter for screening for infections with a predominantly sexual mode of transmission: Secondary | ICD-10-CM

## 2020-12-26 DIAGNOSIS — B9689 Other specified bacterial agents as the cause of diseases classified elsewhere: Secondary | ICD-10-CM

## 2020-12-26 DIAGNOSIS — R399 Unspecified symptoms and signs involving the genitourinary system: Secondary | ICD-10-CM

## 2020-12-26 DIAGNOSIS — N898 Other specified noninflammatory disorders of vagina: Secondary | ICD-10-CM | POA: Diagnosis not present

## 2020-12-26 DIAGNOSIS — N760 Acute vaginitis: Secondary | ICD-10-CM

## 2020-12-26 LAB — POCT WET PREP (WET MOUNT)
Clue Cells Wet Prep Whiff POC: NEGATIVE
Trichomonas Wet Prep HPF POC: ABSENT

## 2020-12-26 LAB — POCT URINALYSIS DIPSTICK
Bilirubin, UA: 1
Blood, UA: NEGATIVE
Glucose, UA: NEGATIVE
Ketones, UA: 2
Leukocytes, UA: NEGATIVE
Nitrite, UA: NEGATIVE
Protein, UA: POSITIVE — AB
Spec Grav, UA: >=1.03 — AB (ref 1.010–1.025)
Urobilinogen, UA: 0.2 E.U./dL
pH, UA: 5 (ref 5.0–8.0)

## 2020-12-26 MED ORDER — METRONIDAZOLE 500 MG PO TABS: 500.0000 mg | ORAL_TABLET | Freq: Two times a day (BID) | ORAL | 0 refills | Status: DC

## 2020-12-26 MED ORDER — NITROFURANTOIN MONOHYD MACRO 100 MG PO CAPS: 100.0000 mg | ORAL_CAPSULE | Freq: Two times a day (BID) | ORAL | 0 refills | Status: AC

## 2020-12-26 NOTE — Progress Notes (Signed)
Patient ID: Paula Sullivan, female   DOB: 1985-10-17, 35 y.o.   MRN: [id number]  Reason for Visit: Pelvic Pain (Frequent urination, center of pelvic area, lower back pain, would like STD testing/) and Vaginal Odor (Garlic odor)   Referred by Lyndon Code, MD  Subjective:     Pelvic Pain The patient's primary symptoms include pelvic pain. :  Paula Sullivan is a 35 y.o. female who presents with concern for lower abdominal pain and back pain, pelvic pressure, frequent urination with small amount of urine despite adequate hydration. The patient reports these symptoms have been present for about a week. She denies vaginal discharge but notes a "garlic" odor. She denies symptoms of vaginal itching or irritation. She reports she is currently sexually active and noted dyspareunia once last week associated with her recent onset of symptoms.  She is requesting STI screening today.  Gynecological History Menopause: n/a LMP: 05/02/20 Describes periods as irregular Last pap smear: 02/2020 Last Mammogram: n/a History of STDs: yes Sexually Active: yes  Obstetrical History [id number]  Past Medical History:  Diagnosis Date   ADHD    Type AB blood, Rh negative    Family History  Problem Relation Age of Onset   Suicidality Father        2011   Diabetes Maternal Grandfather    Cancer Mother 70       hyst--? cervical   Past Surgical History:  Procedure Laterality Date   CHOLECYSTECTOMY     GALLBLADDER SURGERY  7-06   SLEEVE GASTROPLASTY      Short Social History:  Social History   Tobacco Use   Smoking status: Never   Smokeless tobacco: Never  Substance Use Topics   Alcohol use: Yes    Comment: Rarely    Allergies  Allergen Reactions   Latex Rash    Paula childhood reaction.     Current Outpatient Medications  Medication Sig Dispense Refill   amphetamine-dextroamphetamine (ADDERALL) 20 MG tablet Take 1 tablet (20 mg total) by mouth 2 (two) times daily. 60 tablet 0   [START  ON 01/16/2021] amphetamine-dextroamphetamine (ADDERALL) 20 MG tablet Take 1 tablet (20 mg total) by mouth 2 (two) times daily. 60 tablet 0   [START ON 02/16/2021] amphetamine-dextroamphetamine (ADDERALL) 20 MG tablet Take 1 tablet (20 mg total) by mouth 2 (two) times daily. 60 tablet 0   azelastine (ASTELIN) 0.1 % nasal spray PLACE 1 SPRAY INTO BOTH NOSTRILS 2 (TWO) TIMES DAILY. USE IN EACH NOSTRIL AS DIRECTED 30 mL 1   cetirizine (ZYRTEC) 10 MG tablet Take 1 tablet (10 mg total) by mouth daily. 90 tablet 3   escitalopram (LEXAPRO) 10 MG tablet Take 1 tablet (10 mg total) by mouth daily. 90 tablet 1   metroNIDAZOLE (FLAGYL) 500 MG tablet Take 1 tablet (500 mg total) by mouth 2 (two) times daily for 7 days. 14 tablet 0   nitrofurantoin, macrocrystal-monohydrate, (MACROBID) 100 MG capsule Take 1 capsule (100 mg total) by mouth 2 (two) times daily for 5 days. 10 capsule 0   traZODone (DESYREL) 50 MG tablet Take 0.5-1 tablets (25-50 mg total) by mouth at bedtime as needed. for sleep 90 tablet 1   No current facility-administered medications for this visit.    Review of Systems  All other systems reviewed and are negative      Objective:  Objective   Vitals:   12/26/20 1429  BP: 120/80  Weight: 227 lb (103 kg)  Height: 5\' 3"  (1.6 m)  Body mass index is 40.21 kg/m.  Physical Exam Vitals reviewed.  Constitutional:      Appearance: Normal appearance. She is obese.  Pulmonary:     Effort: Pulmonary effort is normal.  Abdominal:     General: Abdomen is flat.     Palpations: Abdomen is soft.  Genitourinary:    Comments: External: Normal appearing vulva. No lesions noted.  Speculum examination: Normal appearing cervix. No blood in the vaginal vault. Small amount of grey discharge noted in vaginal vault.  Bimanual examination: Uterus midline, non-tender, normal in size, shape and contour.  No CMT. No adnexal masses. No adnexal tenderness. Pelvis not fixed.     Skin:    General: Skin is  warm and dry.  Neurological:     General: No focal deficit present.     Mental Status: She is alert and oriented to person, place, and time.  Psychiatric:        Mood and Affect: Mood normal.        Behavior: Behavior normal.    Assessment/Plan:     35 yo, [id number], presents with concern for urinary symptoms, vaginal odor, and request STI screening.  -Will treat presumptively for UTI - culture sent -Screen for STIs -Wet mount + for clue cells - will treat for BV -Follow-up pending lab results  Visit Diagnoses     UTI symptoms    -  Primary   Relevant Medications   nitrofurantoin, macrocrystal-monohydrate, (MACROBID) 100 MG capsule   Other Relevant Orders   POCT Urinalysis Dipstick (Completed)   Urine Culture   Screen for sexually transmitted diseases       Relevant Orders   Cervicovaginal ancillary only   HIV Antibody (routine testing w rflx)   Hepatitis B surface antigen   RPR   Bacterial vaginosis       Relevant Medications   metroNIDAZOLE (FLAGYL) 500 MG tablet   nitrofurantoin, macrocrystal-monohydrate, (MACROBID) 100 MG capsule   Vaginal discharge       Relevant Orders   POCT Wet Prep Mellody Drown [facility]) (Completed)         Zipporah Plants, CNM [facility], [facility] 12/26/2020 3:05 PM

## 2020-12-28 LAB — CERVICOVAGINAL ANCILLARY ONLY
Bacterial Vaginitis (gardnerella): POSITIVE — AB
Candida Glabrata: NEGATIVE
Candida Vaginitis: NEGATIVE
Chlamydia: NEGATIVE
Comment: NEGATIVE
Comment: NEGATIVE
Comment: NEGATIVE
Comment: NEGATIVE
Comment: NEGATIVE
Comment: NORMAL
Neisseria Gonorrhea: NEGATIVE
Trichomonas: NEGATIVE

## 2020-12-29 LAB — URINE CULTURE

## 2020-12-31 ENCOUNTER — Other Ambulatory Visit: Payer: Self-pay | Admitting: Advanced Practice Midwife

## 2020-12-31 DIAGNOSIS — N760 Acute vaginitis: Secondary | ICD-10-CM

## 2020-12-31 DIAGNOSIS — B9689 Other specified bacterial agents as the cause of diseases classified elsewhere: Secondary | ICD-10-CM

## 2020-12-31 MED ORDER — CLINDAMYCIN HCL 300 MG PO CAPS: 300.0000 mg | ORAL_CAPSULE | Freq: Two times a day (BID) | ORAL | 0 refills | Status: AC

## 2020-12-31 NOTE — Progress Notes (Signed)
Rx clindamycin sent to take instead of flagyl (patient unable to tolerate).

## 2021-01-03 ENCOUNTER — Telehealth: Payer: Self-pay

## 2021-01-03 NOTE — Telephone Encounter (Signed)
Unable to [facility], Amphetamine-Dextroamphetamine is not covered by insurance pt will have to pay out of pocket

## 2021-01-04 ENCOUNTER — Telehealth: Payer: Self-pay

## 2021-01-04 NOTE — Telephone Encounter (Signed)
Unable to [facility] again, Amphetamine-Dextroamphetamine is not covered by insurance pt will have to pay out of pocket for meds

## 2021-01-11 ENCOUNTER — Ambulatory Visit: Payer: Medicaid Other | Admitting: Physician Assistant

## 2021-01-15 ENCOUNTER — Other Ambulatory Visit: Payer: Self-pay | Admitting: Internal Medicine

## 2021-01-15 DIAGNOSIS — F988 Other specified behavioral and emotional disorders with onset usually occurring in childhood and adolescence: Secondary | ICD-10-CM

## 2021-01-16 ENCOUNTER — Telehealth: Payer: Self-pay

## 2021-01-16 ENCOUNTER — Other Ambulatory Visit: Payer: Self-pay

## 2021-01-16 DIAGNOSIS — F5101 Primary insomnia: Secondary | ICD-10-CM

## 2021-01-16 MED ORDER — TRAZODONE HCL 50 MG PO TABS: 25.0000 mg | ORAL_TABLET | Freq: Every evening | ORAL | 1 refills | Status: DC | PRN

## 2021-01-16 NOTE — Telephone Encounter (Signed)
Can you clear this since it was already sent to pharmacy

## 2021-01-16 NOTE — Telephone Encounter (Signed)
Pt called stating that the pharmacy said they didn't get her prescriptions for her adderall.  I called [facility] [address] [address] and spoke to pharmacist and he advised that they did receive all 3 rx's for 12/17/20, 01/16/21, and 02/16/21.  He is getting the one ready for pt for 01/16/21.  I called pt back and informed her that the pharmacy did have the rx and was getting it ready for her.

## 2021-02-26 ENCOUNTER — Encounter: Payer: Self-pay | Admitting: Obstetrics and Gynecology

## 2021-02-26 ENCOUNTER — Other Ambulatory Visit: Payer: Self-pay

## 2021-02-26 ENCOUNTER — Ambulatory Visit (INDEPENDENT_AMBULATORY_CARE_PROVIDER_SITE_OTHER): Payer: BC Managed Care – PPO | Admitting: Obstetrics and Gynecology

## 2021-02-26 VITALS — BP 110/80 | Ht 63.0 in | Wt 208.0 lb

## 2021-02-26 DIAGNOSIS — N870 Mild cervical dysplasia: Secondary | ICD-10-CM

## 2021-02-26 DIAGNOSIS — Z124 Encounter for screening for malignant neoplasm of cervix: Secondary | ICD-10-CM | POA: Diagnosis not present

## 2021-02-26 DIAGNOSIS — Z01419 Encounter for gynecological examination (general) (routine) without abnormal findings: Secondary | ICD-10-CM | POA: Diagnosis not present

## 2021-02-26 DIAGNOSIS — N926 Irregular menstruation, unspecified: Secondary | ICD-10-CM

## 2021-02-26 DIAGNOSIS — Z1151 Encounter for screening for human papillomavirus (HPV): Secondary | ICD-10-CM

## 2021-02-26 NOTE — Progress Notes (Signed)
PCP:  Lyndon Code, MD   Chief Complaint  Patient presents with   Gynecologic Exam    No concerns     HPI:      [name] is a 35 y.o. [phone] whose LMP was Patient's last menstrual period was 02/04/2021 (approximate)., presents today for her annual examination.  Her menses are irregular for the past 6 months since her bariatric surgery with wt loss; every 1-2 months, lasting 7-10 days. Flow is heavier in general but also if she misses a period, with 1/2 dollar sized clots, rare BTB. Dysmenorrhea mild to mod, occurring first 1-2 days of flow, uses heating pad.   Sex activity: single partner, contraception -condoms. Considering TL. OCPs started 5/21 but pt bled for 2 months and didn't feel well, so stopped them. Declines other BC since doesn't feel good with BC. Had bloating with IUD in past.  Last Pap: 02/15/20 Results were neg cells, POS HPV DNA. 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 There is no FH of ovarian cancer. The patient does self-breast exams.  Tobacco use: The patient denies current or previous tobacco use. Alcohol use: none No drug use.  Exercise: moderately active  She does get adequate calcium and Vitamin D in her diet. Had normal labs 5/21.  Past Medical History:  Diagnosis Date   ADHD    Type AB blood, Rh negative     Past Surgical History:  Procedure Laterality Date   CHOLECYSTECTOMY     GALLBLADDER SURGERY  7-06   SLEEVE GASTROPLASTY      Family History  Problem Relation Age of Onset   Suicidality Father        2011   Diabetes Maternal Grandfather    Cancer Mother 60       hyst--? cervical    Social History   Socioeconomic History   Marital status: Single    Spouse name: Not on file   Number of children: 1   Years of education: Not on  file   Highest education level: Not on file  Occupational History   Not on file  Tobacco Use   Smoking status: Never   Smokeless tobacco: Never  Vaping Use   Vaping Use: Never used  Substance and Sexual Activity   Alcohol use: Yes    Comment: Rarely   Drug use: No   Sexual activity: Yes    Birth control/protection: None  Other Topics Concern   Not on file  Social History Narrative   Not on file   Social Determinants of Health   Financial Resource Strain: Not on file  Food Insecurity: Not on file  Transportation Needs: Not on file  Physical Activity: Not on file  Stress: Not on file  Social Connections: Not on file  Intimate Partner Violence: Not on file     Current Outpatient Medications:    amphetamine-dextroamphetamine (ADDERALL) 20 MG tablet, Take 1 tablet (20 mg total) by mouth 2 (two) times daily., Disp: 60 tablet, Rfl: 0   amphetamine-dextroamphetamine (ADDERALL) 20 MG tablet, Take 1 tablet (20 mg total) by mouth 2 (two) times daily., Disp: 60 tablet, Rfl: 0   amphetamine-dextroamphetamine (ADDERALL) 20 MG tablet, Take 1 tablet (20 mg total) by mouth 2 (two) times daily., Disp: 60 tablet,  Rfl: 0   azelastine (ASTELIN) 0.1 % nasal spray, PLACE 1 SPRAY INTO BOTH NOSTRILS 2 (TWO) TIMES DAILY. USE IN EACH NOSTRIL AS DIRECTED, Disp: 30 mL, Rfl: 1   cetirizine (ZYRTEC) 10 MG tablet, Take 1 tablet (10 mg total) by mouth daily., Disp: 90 tablet, Rfl: 3   escitalopram (LEXAPRO) 10 MG tablet, Take 1 tablet (10 mg total) by mouth daily., Disp: 90 tablet, Rfl: 1   traZODone (DESYREL) 50 MG tablet, Take 0.5-1 tablets (25-50 mg total) by mouth at bedtime as needed. for sleep, Disp: 90 tablet, Rfl: 1    ROS:  Review of Systems  Constitutional:  Negative for fatigue, fever and unexpected weight change.  Respiratory:  Negative for cough, shortness of breath and wheezing.   Cardiovascular:  Negative for chest pain, palpitations and leg swelling.  Gastrointestinal:  Negative for  blood in stool, constipation, diarrhea, nausea and vomiting.  Endocrine: Negative for cold intolerance, heat intolerance and polyuria.  Genitourinary:  Negative for dyspareunia, dysuria, flank pain, frequency, genital sores, hematuria, menstrual problem, pelvic pain, urgency, vaginal bleeding, vaginal discharge and vaginal pain.  Musculoskeletal:  Negative for back pain, joint swelling and myalgias.  Skin:  Negative for rash.  Neurological:  Negative for dizziness, syncope, light-headedness, numbness and headaches.  Hematological:  Negative for adenopathy.  Psychiatric/Behavioral:  Negative for agitation, confusion, sleep disturbance and suicidal ideas. The patient is not nervous/anxious.   BREAST: No symptoms   Objective: BP 110/80   Ht 5\' 3"  (1.6 m)   Wt 208 lb (94.3 kg)   LMP 02/04/2021 (Approximate)   BMI 36.85 kg/m    Physical Exam Constitutional:      Appearance: She is well-developed.  Genitourinary:     Vulva normal.     Right Labia: No rash, tenderness or lesions.    Left Labia: No tenderness, lesions or rash.    No vaginal discharge, erythema or tenderness.      Right Adnexa: not tender and no mass present.    Left Adnexa: not tender and no mass present.    No cervical friability or polyp.     Uterus is not enlarged or tender.  Breasts:    Right: No mass, nipple discharge, skin change or tenderness.     Left: No mass, nipple discharge, skin change or tenderness.  Neck:     Thyroid: No thyromegaly.  Cardiovascular:     Rate and Rhythm: Normal rate and regular rhythm.     Heart sounds: Normal heart sounds. No murmur heard. Pulmonary:     Effort: Pulmonary effort is normal.     Breath sounds: Normal breath sounds.  Abdominal:     Palpations: Abdomen is soft.     Tenderness: There is no abdominal tenderness. There is no guarding or rebound.  Musculoskeletal:        General: Normal range of motion.     Cervical back: Normal range of motion.  Lymphadenopathy:      Cervical: No cervical adenopathy.  Neurological:     General: No focal deficit present.     Mental Status: She is alert and oriented to person, place, and time.     Cranial Nerves: No cranial nerve deficit.  Skin:    General: Skin is warm and dry.  Psychiatric:        Mood and Affect: Mood normal.        Behavior: Behavior normal.        Thought Content: Thought content normal.  Judgment: Judgment normal.  Vitals reviewed.    Assessment/Plan: Encounter for annual routine gynecological examination  Cervical cancer screening - Plan: IGP, Aptima HPV,   Screening for HPV (human papillomavirus) - Plan: IGP, Aptima HPV,   Dysplasia of cervix, low grade (CIN 1) - Plan: IGP, Aptima HPV,   Irregular menses--since bariatric surg. Reassurance, should resume to normal once wt stabilizes. Also discussed endometrial ablation with TL due to heavier flow (doesn't do well with hormonal BC/IUD). F/u  with MD prn.     GYN counsel adequate intake of calcium and vitamin D, diet and exercise     F/U  Return in about 1 year (around 02/26/2022).  Zain Bingman B. Shaleen Talamantez, PA-C 02/27/2021 9:26 AM

## 2021-02-26 NOTE — Patient Instructions (Signed)
I value your feedback and you entrusting us with your care. If you get a [facility] patient survey, I would appreciate you taking the time to let us know about your experience today. Thank you! ? ? ?

## 2021-02-27 DIAGNOSIS — N870 Mild cervical dysplasia: Secondary | ICD-10-CM | POA: Insufficient documentation

## 2021-02-28 LAB — IGP, APTIMA HPV: HPV Aptima: NEGATIVE

## 2021-03-22 ENCOUNTER — Encounter: Payer: Self-pay | Admitting: Physician Assistant

## 2021-03-22 ENCOUNTER — Ambulatory Visit: Payer: Medicaid Other | Admitting: Physician Assistant

## 2021-03-22 ENCOUNTER — Other Ambulatory Visit: Payer: Self-pay

## 2021-03-22 DIAGNOSIS — F988 Other specified behavioral and emotional disorders with onset usually occurring in childhood and adolescence: Secondary | ICD-10-CM | POA: Diagnosis not present

## 2021-03-22 DIAGNOSIS — F411 Generalized anxiety disorder: Secondary | ICD-10-CM | POA: Diagnosis not present

## 2021-03-22 DIAGNOSIS — Z79899 Other long term (current) drug therapy: Secondary | ICD-10-CM

## 2021-03-22 DIAGNOSIS — Z23 Encounter for immunization: Secondary | ICD-10-CM

## 2021-03-22 LAB — POCT URINE DRUG SCREEN
POC Amphetamine UR: NOT DETECTED
POC BENZODIAZEPINES UR: NOT DETECTED
POC Barbiturate UR: NOT DETECTED
POC Cocaine UR: NOT DETECTED
POC Ecstasy UR: NOT DETECTED
POC Marijuana UR: NOT DETECTED
POC Methadone UR: NOT DETECTED
POC Methamphetamine UR: NOT DETECTED
POC Opiate Ur: NOT DETECTED
POC Oxycodone UR: NOT DETECTED
POC PHENCYCLIDINE UR: NOT DETECTED
POC TRICYCLICS UR: NOT DETECTED

## 2021-03-22 MED ORDER — AMPHETAMINE-DEXTROAMPHETAMINE 20 MG PO TABS: 20.0000 mg | ORAL_TABLET | Freq: Two times a day (BID) | ORAL | 0 refills | Status: DC

## 2021-03-22 MED ORDER — ESCITALOPRAM OXALATE 10 MG PO TABS: 10.0000 mg | ORAL_TABLET | Freq: Every day | ORAL | 1 refills | Status: DC

## 2021-03-22 NOTE — Progress Notes (Signed)
[facility] [address] [place], [place] [address]  Internal MEDICINE  Office Visit Note  Patient Name: Paula Sullivan  [id number]  [id number]  Date of Service: 03/22/2021  Chief Complaint  Patient presents with   Follow-up   ADHD    HPI Pt is here for routine follow up and med refill -Had her well woman exam with OBGYN with pap 2 weeks ago -Appetite is stable, though reduced post gastric sleeve and continues to lose weight as desired (28lbs since last visit) -She did not have a chance to get labs done and will get them done prior to CPE next visit -Continued to take adderall BID. Skips on weekends or off days and did not take it today. Denies any palpitations and is sleeping well with trazodone and with setting alarm for second dose of adderall so that she doesn't take it too late in afternoon. Requesting refills today -requests flu vaccine today  Current Medication: Outpatient Encounter Medications as of 03/22/2021  Medication Sig   azelastine (ASTELIN) 0.1 % nasal spray PLACE 1 SPRAY INTO BOTH NOSTRILS 2 (TWO) TIMES DAILY. USE IN EACH NOSTRIL AS DIRECTED   cetirizine (ZYRTEC) 10 MG tablet Take 1 tablet (10 mg total) by mouth daily.   traZODone (DESYREL) 50 MG tablet Take 0.5-1 tablets (25-50 mg total) by mouth at bedtime as needed. for sleep   [DISCONTINUED] amphetamine-dextroamphetamine (ADDERALL) 20 MG tablet Take 1 tablet (20 mg total) by mouth 2 (two) times daily.   [DISCONTINUED] amphetamine-dextroamphetamine (ADDERALL) 20 MG tablet Take 1 tablet (20 mg total) by mouth 2 (two) times daily.   [DISCONTINUED] amphetamine-dextroamphetamine (ADDERALL) 20 MG tablet Take 1 tablet (20 mg total) by mouth 2 (two) times daily.   [DISCONTINUED] escitalopram (LEXAPRO) 10 MG tablet Take 1 tablet (10 mg total) by mouth daily.   amphetamine-dextroamphetamine (ADDERALL) 20 MG tablet Take 1 tablet (20 mg total) by mouth 2 (two) times daily.   [START ON 04/21/2021]  amphetamine-dextroamphetamine (ADDERALL) 20 MG tablet Take 1 tablet (20 mg total) by mouth 2 (two) times daily.   [START ON 05/22/2021] amphetamine-dextroamphetamine (ADDERALL) 20 MG tablet Take 1 tablet (20 mg total) by mouth 2 (two) times daily.   escitalopram (LEXAPRO) 10 MG tablet Take 1 tablet (10 mg total) by mouth daily.   [DISCONTINUED] norgestimate-ethinyl estradiol (ORTHO-CYCLEN) 0.25-35 MG-MCG tablet Take 1 tablet by mouth daily.   No facility-administered encounter medications on file as of 03/22/2021.    Surgical History: Past Surgical History:  Procedure Laterality Date   CHOLECYSTECTOMY     GALLBLADDER SURGERY  7-06   SLEEVE GASTROPLASTY      Medical History: Past Medical History:  Diagnosis Date   ADHD    Type AB blood, Rh negative     Family History: Family History  Problem Relation Age of Onset   Suicidality Father        2011   Diabetes Maternal Grandfather    Cancer Mother 37       hyst--? cervical    Social History   Socioeconomic History   Marital status: Single    Spouse name: Not on file   Number of children: 1   Years of education: Not on file   Highest education level: Not on file  Occupational History   Not on file  Tobacco Use   Smoking status: Never   Smokeless tobacco: Never  Vaping Use   Vaping Use: Never used  Substance and Sexual Activity   Alcohol use: Yes  Comment: Rarely   Drug use: No   Sexual activity: Yes    Birth control/protection: None  Other Topics Concern   Not on file  Social History Narrative   Not on file   Social Determinants of Health   Financial Resource Strain: Not on file  Food Insecurity: Not on file  Transportation Needs: Not on file  Physical Activity: Not on file  Stress: Not on file  Social Connections: Not on file  Intimate Partner Violence: Not on file      Review of Systems  Constitutional:  Negative for chills, fatigue and unexpected weight change.  HENT:  Negative for congestion,  postnasal drip, rhinorrhea, sneezing and sore throat.   Eyes:  Negative for redness.  Respiratory:  Negative for cough, chest tightness and shortness of breath.   Cardiovascular:  Negative for chest pain and palpitations.  Gastrointestinal:  Negative for abdominal pain, constipation, diarrhea, nausea and vomiting.  Genitourinary:  Negative for dysuria and frequency.  Musculoskeletal:  Negative for arthralgias, back pain, joint swelling and neck pain.  Skin:  Negative for rash.  Neurological: Negative.  Negative for tremors and numbness.  Hematological:  Negative for adenopathy. Does not bruise/bleed easily.  Psychiatric/Behavioral:  Negative for behavioral problems (Depression), sleep disturbance and suicidal ideas. The patient is not nervous/anxious.    Vital Signs: BP 110/80   Pulse 65   Temp 97.8 F (36.6 C)   Resp 16   Ht 5\' 3"  (1.6 m)   Wt 204 lb (92.5 kg)   SpO2 98%   BMI 36.14 kg/m    Physical Exam Vitals and nursing note reviewed.  Constitutional:      General: She is not in acute distress.    Appearance: She is well-developed. She is obese. She is not diaphoretic.  HENT:     Head: Normocephalic and atraumatic.     Mouth/Throat:     Pharynx: No oropharyngeal exudate.  Eyes:     Pupils: Pupils are equal, round, and reactive to light.  Neck:     Thyroid: No thyromegaly.     Vascular: No JVD.     Trachea: No tracheal deviation.  Cardiovascular:     Rate and Rhythm: Normal rate and regular rhythm.     Heart sounds: Normal heart sounds. No murmur heard.   No friction rub. No gallop.  Pulmonary:     Effort: Pulmonary effort is normal. No respiratory distress.     Breath sounds: No wheezing or rales.  Chest:     Chest wall: No tenderness.  Abdominal:     General: Bowel sounds are normal.     Palpations: Abdomen is soft.  Musculoskeletal:        General: Normal range of motion.     Cervical back: Normal range of motion and neck supple.  Lymphadenopathy:      Cervical: No cervical adenopathy.  Skin:    General: Skin is warm and dry.  Neurological:     Mental Status: She is alert and oriented to person, place, and time.     Cranial Nerves: No cranial nerve deficit.  Psychiatric:        Behavior: Behavior normal.        Thought Content: Thought content normal.        Judgment: Judgment normal.       Assessment/Plan: 1. Attention deficit disorder (ADD) in adult May continue adderall up to BID as needed - amphetamine-dextroamphetamine (ADDERALL) 20 MG tablet; Take 1 tablet (20 mg total)  by mouth 2 (two) times daily.  Dispense: 60 tablet; Refill: 0 - amphetamine-dextroamphetamine (ADDERALL) 20 MG tablet; Take 1 tablet (20 mg total) by mouth 2 (two) times daily.  Dispense: 60 tablet; Refill: 0 - amphetamine-dextroamphetamine (ADDERALL) 20 MG tablet; Take 1 tablet (20 mg total) by mouth 2 (two) times daily.  Dispense: 60 tablet; Refill: 0 [place] Controlled Substance Database was reviewed by me for overdose risk score (ORS) Refilled Controlled medications today. Reviewed risks and possible side effects associated with taking Stimulants. Combination of these drugs with other psychotropic medications could cause dizziness and drowsiness. Pt needs to Monitor symptoms and exercise caution in driving and operating heavy machinery to avoid damages to oneself, to others and to the surroundings. Patient verbalized understanding in this matter. Dependence and abuse for these drugs will be monitored closely. A Controlled substance policy and procedure is on file which allows [facility] to order a urine drug screen test at any visit. Patient understands and agrees with the plan..  2. GAD (generalized anxiety disorder) Stable, continue Lexapro daily - escitalopram (LEXAPRO) 10 MG tablet; Take 1 tablet (10 mg total) by mouth daily.  Dispense: 90 tablet; Refill: 1  3. Flu vaccine need - Flu Vaccine MDCK QUAD PF  4. Encounter for long-term (current) use  of high-risk medication - POCT Urine Drug Screen   General Counseling: Raianna verbalizes understanding of the findings of todays visit and agrees with plan of treatment. I have discussed any further diagnostic evaluation that may be needed or ordered today. We also reviewed her medications today. she has been encouraged to call the office with any questions or concerns that should arise related to todays visit.    Orders Placed This Encounter  Procedures   Flu Vaccine MDCK QUAD PF   POCT Urine Drug Screen    Meds ordered this encounter  Medications   escitalopram (LEXAPRO) 10 MG tablet    Sig: Take 1 tablet (10 mg total) by mouth daily.    Dispense:  90 tablet    Refill:  1   amphetamine-dextroamphetamine (ADDERALL) 20 MG tablet    Sig: Take 1 tablet (20 mg total) by mouth 2 (two) times daily.    Dispense:  60 tablet    Refill:  0   amphetamine-dextroamphetamine (ADDERALL) 20 MG tablet    Sig: Take 1 tablet (20 mg total) by mouth 2 (two) times daily.    Dispense:  60 tablet    Refill:  0    Do not fill prior to 04/18/21   amphetamine-dextroamphetamine (ADDERALL) 20 MG tablet    Sig: Take 1 tablet (20 mg total) by mouth 2 (two) times daily.    Dispense:  60 tablet    Refill:  0    Do not refill until 05/22/21    This patient was seen by Lynn Ito, PA-C in collaboration with Dr. Beverely Risen as a part of collaborative care agreement.   Total time spent:30 Minutes Time spent includes review of chart, medications, test results, and follow up plan with the patient.      Dr Lyndon Code Internal medicine

## 2021-04-01 ENCOUNTER — Other Ambulatory Visit: Payer: Self-pay

## 2021-04-01 ENCOUNTER — Telehealth: Payer: Medicaid Other | Admitting: Physician Assistant

## 2021-04-01 ENCOUNTER — Encounter: Payer: Self-pay | Admitting: Physician Assistant

## 2021-04-01 DIAGNOSIS — H6693 Otitis media, unspecified, bilateral: Secondary | ICD-10-CM

## 2021-04-01 DIAGNOSIS — J029 Acute pharyngitis, unspecified: Secondary | ICD-10-CM

## 2021-04-01 MED ORDER — AMOXICILLIN-POT CLAVULANATE 875-125 MG PO TABS: 1.0000 | ORAL_TABLET | Freq: Two times a day (BID) | ORAL | 0 refills | Status: DC

## 2021-04-01 NOTE — Progress Notes (Signed)
[facility] [address] [place], [place] [address]  Internal MEDICINE  Telephone Visit  Patient Name: Paula Sullivan  [id number]  [id number]  Date of Service: 04/01/2021  I connected with the patient at 1:24 by telephone and verified the patients identity using two identifiers.   I discussed the limitations, risks, security and privacy concerns of performing an evaluation and management service by telephone and the availability of in person appointments. I also discussed with the patient that there may be a patient responsible charge related to the service.  The patient expressed understanding and agrees to proceed.    Chief Complaint  Patient presents with   Telephone Assessment    [id number]   Telephone Screen    Home covid test negative    Sore Throat    Going on for 4 days    Ear Pain    both    HPI  Pt is here for virtual sick visit -For the past 4 days has had a sore throat and now has discomfort up to ears. A little cough at night. No SOB or wheezing. -She took two home covid tests which were negative -Tried mucinex, nyquil/dayquil, and ibuprofen -painful to swallow and does feel a little swollen but no signs of any exudates per patient -last time this happened she had bilateral ear infections despite ears not hurting too much.  Current Medication: Outpatient Encounter Medications as of 04/01/2021  Medication Sig   amoxicillin-clavulanate (AUGMENTIN) 875-125 MG tablet Take 1 tablet by mouth 2 (two) times daily. With food   amphetamine-dextroamphetamine (ADDERALL) 20 MG tablet Take 1 tablet (20 mg total) by mouth 2 (two) times daily.   [START ON 04/21/2021] amphetamine-dextroamphetamine (ADDERALL) 20 MG tablet Take 1 tablet (20 mg total) by mouth 2 (two) times daily.   [START ON 05/22/2021] amphetamine-dextroamphetamine (ADDERALL) 20 MG tablet Take 1 tablet (20 mg total) by mouth 2 (two) times daily.   azelastine (ASTELIN) 0.1 % nasal spray PLACE 1 SPRAY INTO  BOTH NOSTRILS 2 (TWO) TIMES DAILY. USE IN EACH NOSTRIL AS DIRECTED   cetirizine (ZYRTEC) 10 MG tablet Take 1 tablet (10 mg total) by mouth daily.   escitalopram (LEXAPRO) 10 MG tablet Take 1 tablet (10 mg total) by mouth daily.   traZODone (DESYREL) 50 MG tablet Take 0.5-1 tablets (25-50 mg total) by mouth at bedtime as needed. for sleep   [DISCONTINUED] norgestimate-ethinyl estradiol (ORTHO-CYCLEN) 0.25-35 MG-MCG tablet Take 1 tablet by mouth daily.   No facility-administered encounter medications on file as of 04/01/2021.    Surgical History: Past Surgical History:  Procedure Laterality Date   CHOLECYSTECTOMY     GALLBLADDER SURGERY  7-06   SLEEVE GASTROPLASTY      Medical History: Past Medical History:  Diagnosis Date   ADHD    Type AB blood, Rh negative     Family History: Family History  Problem Relation Age of Onset   Suicidality Father        2011   Diabetes Maternal Grandfather    Cancer Mother 35       hyst--? cervical    Social History   Socioeconomic History   Marital status: Single    Spouse name: Not on file   Number of children: 1   Years of education: Not on file   Highest education level: Not on file  Occupational History   Not on file  Tobacco Use   Smoking status: Never   Smokeless tobacco: Never  Vaping Use  Vaping Use: Never used  Substance and Sexual Activity   Alcohol use: Yes    Comment: Rarely   Drug use: No   Sexual activity: Yes    Birth control/protection: None  Other Topics Concern   Not on file  Social History Narrative   Not on file   Social Determinants of Health   Financial Resource Strain: Not on file  Food Insecurity: Not on file  Transportation Needs: Not on file  Physical Activity: Not on file  Stress: Not on file  Social Connections: Not on file  Intimate Partner Violence: Not on file      Review of Systems  Constitutional:  Negative for fatigue and fever.  HENT:  Positive for ear pain, postnasal drip,  sore throat and trouble swallowing. Negative for congestion and mouth sores.   Respiratory:  Positive for cough. Negative for shortness of breath and wheezing.   Cardiovascular:  Negative for chest pain.  Genitourinary:  Negative for flank pain.  Psychiatric/Behavioral: Negative.     Vital Signs: Temp 97.8 F (36.6 C)   Ht 5\' 3"  (1.6 m)   Wt 200 lb (90.7 kg)   BMI 35.43 kg/m    Observation/Objective:  Pt is able to carry out conversation   Assessment/Plan: 1. Acute infection of both ears Will start on augmentin due to likely ear infection/sinusitis contributing to postnasal drip and sore throat. - amoxicillin-clavulanate (AUGMENTIN) 875-125 MG tablet; Take 1 tablet by mouth 2 (two) times daily. With food  Dispense: 20 tablet; Refill: 0  2. Sore throat Will start on augmentin due to likely ear infection/sinusitis contributing to postnasal drip and sore throat. May continue mucinex, ibuprofen and sooth throat with warm tea/honey. Patient will call if not improving or any worsening. - amoxicillin-clavulanate (AUGMENTIN) 875-125 MG tablet; Take 1 tablet by mouth 2 (two) times daily. With food  Dispense: 20 tablet; Refill: 0   General Counseling: Emelly verbalizes understanding of the findings of today's phone visit and agrees with plan of treatment. I have discussed any further diagnostic evaluation that may be needed or ordered today. We also reviewed her medications today. she has been encouraged to call the office with any questions or concerns that should arise related to todays visit.    No orders of the defined types were placed in this encounter.   Meds ordered this encounter  Medications   amoxicillin-clavulanate (AUGMENTIN) 875-125 MG tablet    Sig: Take 1 tablet by mouth 2 (two) times daily. With food    Dispense:  20 tablet    Refill:  0    Time spent:25 Minutes    Dr Internal medicine

## 2021-04-18 ENCOUNTER — Encounter: Payer: Self-pay | Admitting: Physician Assistant

## 2021-04-18 ENCOUNTER — Other Ambulatory Visit: Payer: Self-pay

## 2021-04-18 MED ORDER — FLUCONAZOLE 150 MG PO TABS: ORAL_TABLET | ORAL | 0 refills | Status: DC

## 2021-04-18 NOTE — Telephone Encounter (Signed)
DIFLUCAN 150 MG PO QD X 3 DAYS # 3

## 2021-04-23 DIAGNOSIS — D2361 Other benign neoplasm of skin of right upper limb, including shoulder: Secondary | ICD-10-CM | POA: Diagnosis not present

## 2021-04-23 DIAGNOSIS — D485 Neoplasm of uncertain behavior of skin: Secondary | ICD-10-CM | POA: Diagnosis not present

## 2021-04-23 DIAGNOSIS — L298 Other pruritus: Secondary | ICD-10-CM | POA: Diagnosis not present

## 2021-05-10 ENCOUNTER — Telehealth: Payer: Self-pay

## 2021-05-10 NOTE — Telephone Encounter (Signed)
Rec'd call from pt requesting to have a BTL. I offered Harris since he delivered her and she was fine with that. I advised that I would speak with him today and get back in touch with her this afternoon. I did advise that Medicaid requires a consent be signed and a wait period of 30 days must pass.  RPH - does she need a consult appt with you in order to sign consent. Then schedule surgery and H&P according to OR availability once 30 day wait is completed?

## 2021-05-10 NOTE — Telephone Encounter (Signed)
You can have her come by for a workin visit and sign consent (must be informed so would need to discuss w her myself, or Helmut Muster or a provider), then sch procedure 30+ days later.  I can see her today or next week

## 2021-05-10 NOTE — Telephone Encounter (Signed)
Surgery Booking Request Patient Full Name:  Paula Sullivan  MRN: [id number]  DOB: 1986-06-15  Surgeon: Letitia Libra, MD  Requested Surgery Date and Time: 06/13/21 Primary Diagnosis AND Code: Sterility Secondary Diagnosis and Code:  Surgical Procedure: Laparoscopy with Tubal Partial Salingectomy RNFA Requested?: No L&D Notification: No Admission Status: same day surgery Length of Surgery: 25 min Special Case Needs: No H&P: Yes Phone Interview???:  Yes Interpreter: No Medical Clearance:  No Special Scheduling Instructions: No Any known health/anesthesia issues, diabetes, sleep apnea, latex allergy, defibrillator/pacemaker?: No Acuity: P3   (P1 highest, P2 delay may cause harm, P3 low, elective gyn, P4 lowest) Post op follow up visits: yes

## 2021-05-13 ENCOUNTER — Other Ambulatory Visit: Payer: Self-pay

## 2021-05-13 ENCOUNTER — Ambulatory Visit (INDEPENDENT_AMBULATORY_CARE_PROVIDER_SITE_OTHER): Payer: BC Managed Care – PPO | Admitting: Obstetrics & Gynecology

## 2021-05-13 ENCOUNTER — Encounter: Payer: Self-pay | Admitting: Obstetrics & Gynecology

## 2021-05-13 VITALS — BP 120/80 | Ht 63.0 in | Wt 189.0 lb

## 2021-05-13 DIAGNOSIS — Z3009 Encounter for other general counseling and advice on contraception: Secondary | ICD-10-CM | POA: Diagnosis not present

## 2021-05-13 NOTE — Patient Instructions (Signed)
Surgery to Prevent Pregnancy °Sterilization is surgery to prevent pregnancy. Sterilization is permanent. It should only be done if you are sure that you do not want to have children. °For females, the fallopian tubes are either blocked or closed off. When the fallopian tubes are closed, the eggs that the ovaries release cannot enter the uterus, sperm cannot reach the eggs, and pregnancy is prevented. °For males, the vas deferens is cut and then tied or burned (cauterized). The vas deferens is a tube that carries sperm from the testicles. This procedure prevents pregnancy by blocking sperm from going through the vas deferens and penis during ejaculation. °Types of sterilization °  °For females, the surgeries include: °Laparoscopic tubal ligation. In this surgery, the fallopian tubes are tied off, sealed with heat, or blocked with a clip, ring, or clamp. A small portion of each fallopian tube may also be removed. This surgery is done through several small cuts (incisions) with special instruments that are inserted into the abdomen. °Postpartum tubal ligation. This is also called a mini-laparotomy. This surgery is done right after childbirth or 1 or 2 days after childbirth. In this surgery, the fallopian tubes are tied off, sealed with heat, or blocked with a clip, ring, or clamp. A small portion of each fallopian tube may also be removed. The surgery is done through a single incision in the abdomen. °Tubal ligation during a C-section. In this surgery, the fallopian tubes are tied off, sealed with heat, or blocked with a clip, ring, or clamp. A small portion of each fallopian tube may also be removed. The surgery is done at the same time as a C-section delivery. °For males, the surgeries include: °Incision vasectomy. In this surgery, one or two small incisions are made in the scrotum. The vas deferens will be pulled out of the scrotum and cut. The vas deferens will be tied off or sealed with heat and placed back into  your scrotum. The incision will be closed with absorbable stitches (sutures). °No scalpel vasectomy. In this surgery, a punctured opening is made in the scrotum. The vas deferens will be pulled out of the scrotum and cut. The vas deferens will be tied off or sealed with heat and placed back into your scrotum. The opening is small and will not require sutures. °What are the benefits of sterilization? °It is usually effective for a lifetime. °The procedures are generally safe. °For females, sterilization does not affect the hormones, like other types of birth control. Because of this, menstrual periods will not be affected. °For both males and females, sexual desire and sexual performance will not be affected. °What are the disadvantages of sterilization? °Risks from the surgery °Generally, sterilization is safe. Complications are rare. However, there are some risks. They include: °Bleeding. °Infection. °Reaction to medicine used during the procedure. °Injury to surrounding organs. °Risks after sterilization °After a successful surgery, you may have other problems. Female sterilization risks may include: °Failure of the procedure. Sterilization is nearly 100% effective, but it can fail. In rare cases, the fallopian tubes can grow back together over time. If this happens, a female will be able to get pregnant again. °A higher risk of having an ectopic pregnancy. An ectopic pregnancy is a pregnancy that grows outside of the uterus. This kind of pregnancy can lead to serious bleeding if it is not treated. °Female sterilization risks may include: °Bleeding and swelling of the scrotum. °Failure of the procedure. There is a very small chance that the tied or cauterized   ends of the vas deferens may reconnect (recanalization). If this happens, a female could still make a female pregnant. °Other risks may include: °A risk that you may change your mind and decide you want have children. Sterilization may be reversed, but a reversal  is not always successful. °Lack of protection against sexually transmitted infections (STIs). °What happens during the procedure? °The steps of the procedure depend on the type of sterilization you are having. The procedure may vary among health care providers and hospitals. °Questions to ask your health care provider °How effective are sterilization procedures? °What type of procedure is right for me? °Is it possible to reverse the procedure if I change my mind? °What can I expect after the procedure? °Where to find more information °American College of Obstetricians and Gynecologists: www.acog.org/ °[place] Department of Health and Human Services: www.womenshealth.gov/ °[facility]: www.urologyhealth.org °Summary °Sterilization is surgery to prevent pregnancy. °There are different types of sterilization surgeries. °Sterilization may be reversed, but a reversal is not always successful. °Sterilization does not protect against STIs. °This information is not intended to replace advice given to you by your health care provider. Make sure you discuss any questions you have with your health care provider. °Document Revised: 03/31/2020 Document Reviewed: 03/31/2020 °Elsevier Patient Education © 2022 Elsevier Inc. ° °

## 2021-05-13 NOTE — Progress Notes (Signed)
Contraception Counseling Patient is a [id number] WF who presents for contraception counseling. The patient has no complaints today. The patient is sexually active. Pertinent past medical history: none.   Does not desire hormonal therapy.  BTB on OCPs.   PMHx: She  has a past medical history of ADHD and Type AB blood, Rh negative. Also,  has a past surgical history that includes Gallbladder surgery (7-06); Cholecystectomy; and Sleeve Gastroplasty., family history includes Cancer (age of onset: 86) in her mother; Diabetes in her maternal grandfather; Suicidality in her father.,  reports that she has never smoked. She has never used smokeless tobacco. She reports current alcohol use. She reports that she does not use drugs.  She has a current medication list which includes the following prescription(s): amphetamine-dextroamphetamine, azelastine, cetirizine, escitalopram, fluconazole, trazodone, amoxicillin-clavulanate, amphetamine-dextroamphetamine, [START ON 05/22/2021] amphetamine-dextroamphetamine, and [DISCONTINUED] norgestimate-ethinyl estradiol. Also, is allergic to latex.  Review of Systems  Constitutional:  Negative for chills, fever and malaise/fatigue.  HENT:  Negative for congestion, sinus pain and sore throat.   Eyes:  Negative for blurred vision and pain.  Respiratory:  Negative for cough and wheezing.   Cardiovascular:  Negative for chest pain and leg swelling.  Gastrointestinal:  Negative for abdominal pain, constipation, diarrhea, heartburn, nausea and vomiting.  Genitourinary:  Negative for dysuria, frequency, hematuria and urgency.  Musculoskeletal:  Negative for back pain, joint pain, myalgias and neck pain.  Skin:  Negative for itching and rash.  Neurological:  Negative for dizziness, tremors and weakness.  Endo/Heme/Allergies:  Does not bruise/bleed easily.  Psychiatric/Behavioral:  Negative for depression. The patient is not nervous/anxious and does not have insomnia.     Objective: BP 120/80   Ht 5\' 3"  (1.6 m)   Wt 189 lb (85.7 kg)   BMI 33.48 kg/m  Physical Exam Constitutional:      General: She is not in acute distress.    Appearance: She is well-developed.  HENT:     Head: Normocephalic and atraumatic. No laceration.     Right Ear: Hearing normal.     Left Ear: Hearing normal.     Mouth/Throat:     Pharynx: Uvula midline.  Eyes:     Pupils: Pupils are equal, round, and reactive to light.  Neck:     Thyroid: No thyromegaly.  Cardiovascular:     Rate and Rhythm: Normal rate and regular rhythm.     Heart sounds: No murmur heard.   No friction rub. No gallop.  Pulmonary:     Effort: Pulmonary effort is normal. No respiratory distress.     Breath sounds: Normal breath sounds. No wheezing.  Abdominal:     General: Bowel sounds are normal. There is no distension.     Palpations: Abdomen is soft.     Tenderness: There is no abdominal tenderness. There is no rebound.  Musculoskeletal:        General: Normal range of motion.     Cervical back: Normal range of motion and neck supple.  Neurological:     Mental Status: She is alert and oriented to person, place, and time.     Cranial Nerves: No cranial nerve deficit.  Skin:    General: Skin is warm and dry.  Psychiatric:        Judgment: Judgment normal.  Vitals reviewed.   ASSESSMENT/PLAN:    Visit Diagnoses     Sterilization consult    -  Primary   Birth Control I discussed multiple birth control options and methods with  the patient.  The risks and benefits of each were reviewed.  The possible side effects including deep venous thrombosis, breast tenderness, fluid retention, mood changes and abnormal vaginal bleeding were discussed.  Combination as well as progesterone-only options, pros and cons counseled.  The patient has been fully informed about all methods of contraception, both temporary and permanent. She understands that tubal ligation is meant to be permanent, absolute and  irreversible. She was told that there is an approximately 1 in 400 chance of a pregnancy in the future after tubal ligation. She was told the short and long term complications of tubal ligation. She understands the risks from this surgery include, but are not limited to, the risks of anesthesia, hemorrhage, infection, perforation, and injury to adjacent structures, bowel, bladder and blood vessels.   Plan Laparoscopy and partial salpingectomy Consent signed today   Annamarie Major, MD, [facility], [facility] 05/13/2021  3:49 PM

## 2021-05-13 NOTE — Telephone Encounter (Signed)
DOS 06/13/21  H&P 10/31 @ 3:30   Pre-admit phone call appointment to be requested - date and time will be included on H&P paper work. Also all appointments will be updated on pt MyChart. Explained that this appointment has a call window. Based on the time scheduled will indicate if the call will be received within a 4 hour window before 1:00 or after.  Advised that pt may also receive calls from the hospital pharmacy and pre-service center.  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

## 2021-05-13 NOTE — Progress Notes (Signed)
PRE-OPERATIVE HISTORY AND PHYSICAL EXAM  HPI:  Paula Sullivan is a 35 y.o. [id number] No LMP recorded. (Menstrual status: Irregular Periods).; she is being admitted for surgery related to requested sterilization.  PMHx: Past Medical History:  Diagnosis Date   ADHD    Type AB blood, Rh negative    Past Surgical History:  Procedure Laterality Date   CHOLECYSTECTOMY     GALLBLADDER SURGERY  7-06   SLEEVE GASTROPLASTY     Family History  Problem Relation Age of Onset   Suicidality Father        2011   Diabetes Maternal Grandfather    Cancer Mother 14       hyst--? cervical   Social History   Tobacco Use   Smoking status: Never   Smokeless tobacco: Never  Vaping Use   Vaping Use: Never used  Substance Use Topics   Alcohol use: Yes    Comment: Rarely   Drug use: No    Current Outpatient Medications:    amphetamine-dextroamphetamine (ADDERALL) 20 MG tablet, Take 1 tablet (20 mg total) by mouth 2 (two) times daily., Disp: 60 tablet, Rfl: 0   azelastine (ASTELIN) 0.1 % nasal spray, PLACE 1 SPRAY INTO BOTH NOSTRILS 2 (TWO) TIMES DAILY. USE IN EACH NOSTRIL AS DIRECTED, Disp: 30 mL, Rfl: 1   cetirizine (ZYRTEC) 10 MG tablet, Take 1 tablet (10 mg total) by mouth daily., Disp: 90 tablet, Rfl: 3   escitalopram (LEXAPRO) 10 MG tablet, Take 1 tablet (10 mg total) by mouth daily., Disp: 90 tablet, Rfl: 1   fluconazole (DIFLUCAN) 150 MG tablet, Take 1 tablet by mouth once and may repeat in 3 days if symptoms persist, Disp: 3 tablet, Rfl: 0   traZODone (DESYREL) 50 MG tablet, Take 0.5-1 tablets (25-50 mg total) by mouth at bedtime as needed. for sleep, Disp: 90 tablet, Rfl: 1   amoxicillin-clavulanate (AUGMENTIN) 875-125 MG tablet, Take 1 tablet by mouth 2 (two) times daily. With food, Disp: 20 tablet, Rfl: 0   amphetamine-dextroamphetamine (ADDERALL) 20 MG tablet, Take 1 tablet (20 mg total) by mouth 2 (two) times daily., Disp: 60 tablet, Rfl: 0   [START ON 05/22/2021]  amphetamine-dextroamphetamine (ADDERALL) 20 MG tablet, Take 1 tablet (20 mg total) by mouth 2 (two) times daily., Disp: 60 tablet, Rfl: 0 Allergies: Latex  Review of Systems  Constitutional:  Negative for chills, fever and malaise/fatigue.  HENT:  Negative for congestion, sinus pain and sore throat.   Eyes:  Negative for blurred vision and pain.  Respiratory:  Negative for cough and wheezing.   Cardiovascular:  Negative for chest pain and leg swelling.  Gastrointestinal:  Negative for abdominal pain, constipation, diarrhea, heartburn, nausea and vomiting.  Genitourinary:  Negative for dysuria, frequency, hematuria and urgency.  Musculoskeletal:  Negative for back pain, joint pain, myalgias and neck pain.  Skin:  Negative for itching and rash.  Neurological:  Negative for dizziness, tremors and weakness.  Endo/Heme/Allergies:  Does not bruise/bleed easily.  Psychiatric/Behavioral:  Negative for depression. The patient is not nervous/anxious and does not have insomnia.    Objective: BP 120/80   Ht 5\' 3"  (1.6 m)   Wt 189 lb (85.7 kg)   BMI 33.48 kg/m   Filed Weights   05/13/21 1525  Weight: 189 lb (85.7 kg)   Physical Exam Constitutional:      General: She is not in acute distress.    Appearance: She is well-developed.  HENT:     Head:  Normocephalic and atraumatic. No laceration.     Right Ear: Hearing normal.     Left Ear: Hearing normal.     Mouth/Throat:     Pharynx: Uvula midline.  Eyes:     Pupils: Pupils are equal, round, and reactive to light.  Neck:     Thyroid: No thyromegaly.  Cardiovascular:     Rate and Rhythm: Normal rate and regular rhythm.     Heart sounds: No murmur heard.   No friction rub. No gallop.  Pulmonary:     Effort: Pulmonary effort is normal. No respiratory distress.     Breath sounds: Normal breath sounds. No wheezing.  Abdominal:     General: Bowel sounds are normal. There is no distension.     Palpations: Abdomen is soft.     Tenderness:  There is no abdominal tenderness. There is no rebound.  Musculoskeletal:        General: Normal range of motion.     Cervical back: Normal range of motion and neck supple.  Neurological:     Mental Status: She is alert and oriented to person, place, and time.     Cranial Nerves: No cranial nerve deficit.  Skin:    General: Skin is warm and dry.  Psychiatric:        Judgment: Judgment normal.  Vitals reviewed.    Assessment: 1. Sterilization consult   Plan Lap Partial Salpingectomy  I have had a careful discussion with this patient about all the options available and the risk/benefits of each. I have fully informed this patient that surgery may subject her to a variety of discomforts and risks: She understands that most patients have surgery with little difficulty, but problems can happen ranging from minor to fatal. These include nausea, vomiting, pain, bleeding, infection, poor healing, hernia, or formation of adhesions. Unexpected reactions may occur from any drug or anesthetic given. Unintended injury may occur to other pelvic or abdominal structures such as Fallopian tubes, ovaries, bladder, ureter (tube from kidney to bladder), or bowel. Nerves going from the pelvis to the legs may be injured. Any such injury may require immediate or later additional surgery to correct the problem. Excessive blood loss requiring transfusion is very unlikely but possible. Dangerous blood clots may form in the legs or lungs. Physical and sexual activity will be restricted in varying degrees for an indeterminate period of time but most often 2-6 weeks.  Finally, she understands that it is impossible to list every possible undesirable effect and that the condition for which surgery is done is not always cured or significantly improved, and in rare cases may be even worse.Ample time was given to answer all questions.  Annamarie Major, MD, [facility], [facility] 05/13/2021  3:50 PM

## 2021-05-13 NOTE — Telephone Encounter (Signed)
Thx  Please arrange post op 2 weeks after surgery

## 2021-06-03 ENCOUNTER — Encounter
Admission: RE | Admit: 2021-06-03 | Discharge: 2021-06-03 | Disposition: A | Discharge status: Home / Self Care | Payer: BC Managed Care – PPO | Source: Ambulatory Visit | Attending: Obstetrics & Gynecology | Admitting: Obstetrics & Gynecology

## 2021-06-03 ENCOUNTER — Other Ambulatory Visit: Payer: Self-pay

## 2021-06-03 HISTORY — DX: Other specified postprocedural states: Z98.890

## 2021-06-03 HISTORY — DX: Anemia, unspecified: D64.9

## 2021-06-03 HISTORY — DX: Nausea with vomiting, unspecified: R11.2

## 2021-06-03 NOTE — Patient Instructions (Signed)
Your procedure is scheduled on: 06/13/21 Report to DAY SURGERY DEPARTMENT LOCATED ON 2ND FLOOR MEDICAL MALL ENTRANCE. To find out your arrival time please call [phone] between 1PM - 3PM on 06/12/21.  Remember: Instructions that are not followed completely may result in serious medical risk, up to and including death, or upon the discretion of your surgeon and anesthesiologist your surgery may need to be rescheduled.     _X__ 1. Do not eat food or drink any liquids after midnight the night before your procedure.                 No gum chewing or hard candies.   __X__2.  On the morning of surgery brush your teeth with toothpaste and water, you                 may rinse your mouth with mouthwash if you wish.  Do not swallow any              toothpaste of mouthwash.     _X__ 3.  No Alcohol for 24 hours before or after surgery.   _X__ 4.  Do Not Smoke or use e-cigarettes For 24 Hours Prior to Your Surgery.                 Do not use any chewable tobacco products for at least 6 hours prior to                 surgery.  ____  5.  Bring all medications with you on the day of surgery if instructed.   __X__  6.  Notify your doctor if there is any change in your medical condition      (cold, fever, infections).     Do not wear jewelry, make-up, hairpins, clips or nail polish. Do not wear lotions, powders, or perfumes.  Do not shave body hair 48 hours prior to surgery. Men may shave face and neck. Do not bring valuables to the hospital.    [facility] is not responsible for any belongings or valuables.  Contacts, dentures/partials or body piercings may not be worn into surgery. Bring a case for your contacts, glasses or hearing aids, a denture cup will be supplied. Leave your suitcase in the car. After surgery it may be brought to your room. For patients admitted to the hospital, discharge time is determined by your treatment team.   Patients discharged the day of surgery will not be  allowed to drive home.   Please read over the following fact sheets that you were given:   CHG soap  __X__ Take these medicines the morning of surgery with A SIP OF WATER:    1. escitalopram (LEXAPRO) 10 MG tablet  2. cetirizine (ZYRTEC) 10 MG tablet  3.   4.  5.  6.  ____ Fleet Enema (as directed)   __X__ Use CHG Soap/SAGE wipes as directed  ____ Use inhalers on the day of surgery  ____ Stop metformin/Janumet/Farxiga 2 days prior to surgery    ____ Take 1/2 of usual insulin dose the night before surgery. No insulin the morning          of surgery.   ____ Stop Blood Thinners Coumadin/Plavix/Xarelto/Pleta/Pradaxa/Eliquis/Effient/Aspirin  on   Or contact your Surgeon, Cardiologist or Medical Doctor regarding  ability to stop your blood thinners  __X__ Stop Anti-inflammatories 7 days before surgery such as Advil, Ibuprofen, Motrin,  BC or Goodies Powder, Naprosyn, Naproxen, Aleve, Aspirin  __X__ Stop all herbals and supplements, fish oil and vitamins  until after surgery.    ____ Bring C-Pap to the hospital.    DO NOT TAKE YOUR ADDERALL THE MORNING OF SURGERY  YOU MAY USE TYLENOL IF NEEDED FOR PAIN

## 2021-06-04 ENCOUNTER — Other Ambulatory Visit
Admission: RE | Admit: 2021-06-04 | Discharge: 2021-06-04 | Disposition: A | Discharge status: Home / Self Care | Payer: BC Managed Care – PPO | Source: Ambulatory Visit | Attending: Obstetrics & Gynecology | Admitting: Obstetrics & Gynecology

## 2021-06-04 DIAGNOSIS — Z3009 Encounter for other general counseling and advice on contraception: Secondary | ICD-10-CM | POA: Diagnosis not present

## 2021-06-04 LAB — CBC
HCT: 41.5 % (ref 36.0–46.0)
Hemoglobin: 13.6 g/dL (ref 12.0–15.0)
MCH: 29.2 pg (ref 26.0–34.0)
MCHC: 32.8 g/dL (ref 30.0–36.0)
MCV: 89.2 fL (ref 80.0–100.0)
Platelets: 219 10*3/uL (ref 150–400)
RBC: 4.65 MIL/uL (ref 3.87–5.11)
RDW: 13.1 % (ref 11.5–15.5)
WBC: 6.6 10*3/uL (ref 4.0–10.5)
nRBC: 0 % (ref 0.0–0.2)

## 2021-06-04 LAB — TYPE AND SCREEN
ABO/RH(D): AB NEG
Antibody Screen: NEGATIVE

## 2021-06-13 ENCOUNTER — Ambulatory Visit
Admission: RE | Admit: 2021-06-13 | Discharge: 2021-06-13 | Disposition: A | Discharge status: Home / Self Care | Payer: BC Managed Care – PPO | Attending: Obstetrics & Gynecology | Admitting: Obstetrics & Gynecology

## 2021-06-13 ENCOUNTER — Encounter: Payer: Self-pay | Admitting: Obstetrics & Gynecology

## 2021-06-13 ENCOUNTER — Other Ambulatory Visit: Payer: Self-pay | Admitting: Obstetrics & Gynecology

## 2021-06-13 ENCOUNTER — Other Ambulatory Visit: Payer: Self-pay

## 2021-06-13 ENCOUNTER — Ambulatory Visit: Payer: BC Managed Care – PPO | Admitting: Anesthesiology

## 2021-06-13 ENCOUNTER — Encounter
Admission: RE | Disposition: A | Discharge status: Home / Self Care | Payer: Self-pay | Source: Home / Self Care | Attending: Obstetrics & Gynecology

## 2021-06-13 DIAGNOSIS — F909 Attention-deficit hyperactivity disorder, unspecified type: Secondary | ICD-10-CM | POA: Diagnosis not present

## 2021-06-13 DIAGNOSIS — Z9884 Bariatric surgery status: Secondary | ICD-10-CM | POA: Diagnosis not present

## 2021-06-13 DIAGNOSIS — N926 Irregular menstruation, unspecified: Secondary | ICD-10-CM | POA: Diagnosis not present

## 2021-06-13 DIAGNOSIS — Z3009 Encounter for other general counseling and advice on contraception: Secondary | ICD-10-CM

## 2021-06-13 DIAGNOSIS — Z302 Encounter for sterilization: Secondary | ICD-10-CM | POA: Diagnosis not present

## 2021-06-13 DIAGNOSIS — D649 Anemia, unspecified: Secondary | ICD-10-CM | POA: Diagnosis not present

## 2021-06-13 HISTORY — PX: LAPAROSCOPIC BILATERAL SALPINGECTOMY: SHX5889

## 2021-06-13 LAB — POCT PREGNANCY, URINE: Preg Test, Ur: NEGATIVE

## 2021-06-13 SURGERY — SALPINGECTOMY, BILATERAL, LAPAROSCOPIC
Anesthesia: General | Site: Pelvis | Laterality: Bilateral

## 2021-06-13 MED ORDER — ACETAMINOPHEN 325 MG PO TABS: 650.0000 mg | ORAL_TABLET | ORAL | Status: DC | PRN

## 2021-06-13 MED ORDER — BUPIVACAINE HCL (PF) 0.5 % IJ SOLN
INTRAMUSCULAR | Status: DC | PRN
  Administered 2021-06-13: 10 mL

## 2021-06-13 MED ORDER — LACTATED RINGERS IV SOLN: INTRAVENOUS | Status: DC

## 2021-06-13 MED ORDER — POVIDONE-IODINE 10 % EX SWAB: 2.0000 "application " | Freq: Once | CUTANEOUS | Status: DC

## 2021-06-13 MED ORDER — DEXAMETHASONE SODIUM PHOSPHATE 10 MG/ML IJ SOLN
INTRAMUSCULAR | Status: DC | PRN
  Administered 2021-06-13: 10 mg via INTRAVENOUS

## 2021-06-13 MED ORDER — OXYCODONE-ACETAMINOPHEN 5-325 MG PO TABS
ORAL_TABLET | ORAL | Status: AC
  Filled 2021-06-13: qty 1

## 2021-06-13 MED ORDER — SUGAMMADEX SODIUM 500 MG/5ML IV SOLN
INTRAVENOUS | Status: AC
  Filled 2021-06-13: qty 5

## 2021-06-13 MED ORDER — OXYCODONE-ACETAMINOPHEN 5-325 MG PO TABS
1.0000 | ORAL_TABLET | ORAL | Status: DC | PRN
  Administered 2021-06-13: 1 via ORAL

## 2021-06-13 MED ORDER — FENTANYL CITRATE (PF) 100 MCG/2ML IJ SOLN
INTRAMUSCULAR | Status: DC | PRN
  Administered 2021-06-13 (×2): 50 ug via INTRAVENOUS

## 2021-06-13 MED ORDER — APREPITANT 40 MG PO CAPS
ORAL_CAPSULE | ORAL | Status: AC
  Filled 2021-06-13: qty 1

## 2021-06-13 MED ORDER — LIDOCAINE HCL (CARDIAC) PF 100 MG/5ML IV SOSY
PREFILLED_SYRINGE | INTRAVENOUS | Status: DC | PRN
  Administered 2021-06-13: 80 mg via INTRAVENOUS

## 2021-06-13 MED ORDER — KETOROLAC TROMETHAMINE 30 MG/ML IJ SOLN
INTRAMUSCULAR | Status: AC
  Filled 2021-06-13: qty 1

## 2021-06-13 MED ORDER — FENTANYL CITRATE (PF) 100 MCG/2ML IJ SOLN: 25.0000 ug | INTRAMUSCULAR | Status: DC | PRN

## 2021-06-13 MED ORDER — FENTANYL CITRATE (PF) 100 MCG/2ML IJ SOLN
INTRAMUSCULAR | Status: AC
  Filled 2021-06-13: qty 2

## 2021-06-13 MED ORDER — LIDOCAINE HCL (PF) 2 % IJ SOLN
INTRAMUSCULAR | Status: AC
  Filled 2021-06-13: qty 5

## 2021-06-13 MED ORDER — PROPOFOL 10 MG/ML IV BOLUS
INTRAVENOUS | Status: DC | PRN
  Administered 2021-06-13: 150 mg via INTRAVENOUS

## 2021-06-13 MED ORDER — ONDANSETRON 4 MG PO TBDP: 4.0000 mg | ORAL_TABLET | Freq: Four times a day (QID) | ORAL | 0 refills | Status: DC | PRN

## 2021-06-13 MED ORDER — ROCURONIUM BROMIDE 10 MG/ML (PF) SYRINGE
PREFILLED_SYRINGE | INTRAVENOUS | Status: AC
  Filled 2021-06-13: qty 10

## 2021-06-13 MED ORDER — 0.9 % SODIUM CHLORIDE (POUR BTL) OPTIME
TOPICAL | Status: DC | PRN
  Administered 2021-06-13: 500 mL

## 2021-06-13 MED ORDER — SUGAMMADEX SODIUM 200 MG/2ML IV SOLN
INTRAVENOUS | Status: DC | PRN
  Administered 2021-06-13: 400 mg via INTRAVENOUS

## 2021-06-13 MED ORDER — BUPIVACAINE HCL (PF) 0.5 % IJ SOLN
INTRAMUSCULAR | Status: AC
  Filled 2021-06-13: qty 30

## 2021-06-13 MED ORDER — ONDANSETRON HCL 4 MG/2ML IJ SOLN
INTRAMUSCULAR | Status: DC | PRN
  Administered 2021-06-13: 4 mg via INTRAVENOUS

## 2021-06-13 MED ORDER — PROPOFOL 10 MG/ML IV BOLUS
INTRAVENOUS | Status: AC
  Filled 2021-06-13: qty 20

## 2021-06-13 MED ORDER — ROCURONIUM BROMIDE 100 MG/10ML IV SOLN
INTRAVENOUS | Status: DC | PRN
  Administered 2021-06-13: 40 mg via INTRAVENOUS

## 2021-06-13 MED ORDER — FAMOTIDINE 20 MG PO TABS
ORAL_TABLET | ORAL | Status: AC
  Filled 2021-06-13: qty 1

## 2021-06-13 MED ORDER — ORAL CARE MOUTH RINSE: 15.0000 mL | Freq: Once | OROMUCOSAL | Status: AC

## 2021-06-13 MED ORDER — CHLORHEXIDINE GLUCONATE 0.12 % MT SOLN
OROMUCOSAL | Status: AC
  Filled 2021-06-13: qty 15

## 2021-06-13 MED ORDER — MIDAZOLAM HCL 2 MG/2ML IJ SOLN
INTRAMUSCULAR | Status: DC | PRN
  Administered 2021-06-13: 2 mg via INTRAVENOUS

## 2021-06-13 MED ORDER — MORPHINE SULFATE (PF) 2 MG/ML IV SOLN: 1.0000 mg | INTRAVENOUS | Status: DC | PRN

## 2021-06-13 MED ORDER — MIDAZOLAM HCL 2 MG/2ML IJ SOLN
INTRAMUSCULAR | Status: AC
  Filled 2021-06-13: qty 2

## 2021-06-13 MED ORDER — CHLORHEXIDINE GLUCONATE 0.12 % MT SOLN
15.0000 mL | Freq: Once | OROMUCOSAL | Status: AC
  Administered 2021-06-13: 15 mL via OROMUCOSAL

## 2021-06-13 MED ORDER — APREPITANT 40 MG PO CAPS
40.0000 mg | ORAL_CAPSULE | Freq: Once | ORAL | Status: AC
  Administered 2021-06-13: 40 mg via ORAL

## 2021-06-13 MED ORDER — FAMOTIDINE 20 MG PO TABS
20.0000 mg | ORAL_TABLET | Freq: Once | ORAL | Status: AC
  Administered 2021-06-13: 20 mg via ORAL

## 2021-06-13 MED ORDER — ONDANSETRON HCL 4 MG/2ML IJ SOLN: 4.0000 mg | Freq: Once | INTRAMUSCULAR | Status: DC | PRN

## 2021-06-13 MED ORDER — OXYCODONE-ACETAMINOPHEN 5-325 MG PO TABS: 1.0000 | ORAL_TABLET | ORAL | 0 refills | Status: DC | PRN

## 2021-06-13 MED ORDER — ACETAMINOPHEN 650 MG RE SUPP
650.0000 mg | RECTAL | Status: DC | PRN
  Filled 2021-06-13: qty 1

## 2021-06-13 MED ORDER — ONDANSETRON HCL 4 MG/2ML IJ SOLN
INTRAMUSCULAR | Status: AC
  Filled 2021-06-13: qty 2

## 2021-06-13 MED ORDER — DEXAMETHASONE SODIUM PHOSPHATE 10 MG/ML IJ SOLN
INTRAMUSCULAR | Status: AC
  Filled 2021-06-13: qty 1

## 2021-06-13 MED ORDER — KETOROLAC TROMETHAMINE 30 MG/ML IJ SOLN
INTRAMUSCULAR | Status: DC | PRN
  Administered 2021-06-13: 30 mg via INTRAVENOUS

## 2021-06-13 SURGICAL SUPPLY — 45 items
ADH SKN CLS APL DERMABOND .7 (GAUZE/BANDAGES/DRESSINGS) ×1
APL PRP STRL LF DISP 70% ISPRP (MISCELLANEOUS) ×1
BAG SPEC RTRVL LRG 6X4 10 (ENDOMECHANICALS)
BLADE SURG SZ11 CARB STEEL (BLADE) ×2 IMPLANT
CATH ROBINSON RED A/P 16FR (CATHETERS) ×2 IMPLANT
CHLORAPREP W/TINT 26 (MISCELLANEOUS) ×2 IMPLANT
DERMABOND ADVANCED (GAUZE/BANDAGES/DRESSINGS) ×1
DERMABOND ADVANCED .7 DNX12 (GAUZE/BANDAGES/DRESSINGS) ×1 IMPLANT
DRSG TELFA 4X3 1S NADH ST (GAUZE/BANDAGES/DRESSINGS) IMPLANT
GAUZE 4X4 16PLY ~~LOC~~+RFID DBL (SPONGE) ×4 IMPLANT
GLOVE SURG ENC MOIS LTX SZ8 (GLOVE) ×4 IMPLANT
GLOVE SURG UNDER LTX SZ8 (GLOVE) ×2 IMPLANT
GOWN STRL REUS W/ TWL LRG LVL3 (GOWN DISPOSABLE) ×1 IMPLANT
GOWN STRL REUS W/ TWL XL LVL3 (GOWN DISPOSABLE) ×1 IMPLANT
GOWN STRL REUS W/TWL LRG LVL3 (GOWN DISPOSABLE) ×2
GOWN STRL REUS W/TWL XL LVL3 (GOWN DISPOSABLE) ×2
GRASPER SUT TROCAR 14GX15 (MISCELLANEOUS) ×2 IMPLANT
IRRIGATION STRYKERFLOW (MISCELLANEOUS) IMPLANT
IRRIGATOR STRYKERFLOW (MISCELLANEOUS)
IV LACTATED RINGERS 1000ML (IV SOLUTION) IMPLANT
KIT PINK PAD W/HEAD ARE REST (MISCELLANEOUS) ×2
KIT PINK PAD W/HEAD ARM REST (MISCELLANEOUS) ×1 IMPLANT
LABEL OR SOLS (LABEL) ×2 IMPLANT
MANIFOLD NEPTUNE II (INSTRUMENTS) ×2 IMPLANT
NEEDLE VERESS 14GA 120MM (NEEDLE) ×2 IMPLANT
NS IRRIG 500ML POUR BTL (IV SOLUTION) ×2 IMPLANT
PACK GYN LAPAROSCOPIC (MISCELLANEOUS) ×2 IMPLANT
PAD PREP 24X41 OB/GYN DISP (PERSONAL CARE ITEMS) ×2 IMPLANT
POUCH SPECIMEN RETRIEVAL 10MM (ENDOMECHANICALS) IMPLANT
SCISSORS METZENBAUM CVD 33 (INSTRUMENTS) ×2 IMPLANT
SCRUB EXIDINE 4% CHG 4OZ (MISCELLANEOUS) ×2 IMPLANT
SET TUBE SMOKE EVAC HIGH FLOW (TUBING) ×2 IMPLANT
SHEARS HARMONIC ACE PLUS 36CM (ENDOMECHANICALS) ×2 IMPLANT
SLEEVE ENDOPATH XCEL 5M (ENDOMECHANICALS) IMPLANT
SOL PREP PROV IODINE SCRUB 4OZ (MISCELLANEOUS) ×2 IMPLANT
SPONGE GAUZE 2X2 8PLY STRL LF (GAUZE/BANDAGES/DRESSINGS) ×2 IMPLANT
STRAP SAFETY 5IN WIDE (MISCELLANEOUS) ×2 IMPLANT
SUT VIC AB 0 CT1 36 (SUTURE) ×2 IMPLANT
SUT VIC AB 2-0 UR6 27 (SUTURE) IMPLANT
SUT VIC AB 4-0 PS2 18 (SUTURE) IMPLANT
SYR 10ML LL (SYRINGE) ×2 IMPLANT
SYSTEM WECK SHIELD CLOSURE (TROCAR) IMPLANT
TROCAR ENDO BLADELESS 11MM (ENDOMECHANICALS) IMPLANT
TROCAR XCEL NON-BLD 5MMX100MML (ENDOMECHANICALS) ×2 IMPLANT
WATER STERILE IRR 500ML POUR (IV SOLUTION) IMPLANT

## 2021-06-13 NOTE — Transfer of Care (Signed)
Immediate Anesthesia Transfer of Care Note  Patient: Paula Sullivan  Procedure(s) Performed: LAPAROSCOPIC BILATERAL SALPINGECTOMY (Bilateral: Pelvis)  Patient Location: PACU  Anesthesia Type:General  Level of Consciousness: awake and drowsy  Airway & Oxygen Therapy: Patient Spontanous Breathing and Patient connected to nasal cannula oxygen  Post-op Assessment: Report given to RN and Post -op Vital signs reviewed and stable  Post vital signs: stable  Last Vitals:  Vitals Value Taken Time  BP 118/76 06/13/21 0952  Temp 36.2 C 06/13/21 0952  Pulse 62 06/13/21 0954  Resp 12 06/13/21 0954  SpO2 100 % 06/13/21 0954  Vitals shown include unvalidated device data.  Last Pain:  Vitals:   06/13/21 0808  TempSrc: Temporal  PainSc: 0-No pain         Complications: No notable events documented.

## 2021-06-13 NOTE — Anesthesia Procedure Notes (Signed)
Procedure Name: Intubation Date/Time: 06/13/2021 9:02 AM Performed by: Doreen Salvage, CRNA Pre-anesthesia Checklist: Patient identified, Patient being monitored, Timeout performed, Emergency Drugs available and Suction available Patient Re-evaluated:Patient Re-evaluated prior to induction Oxygen Delivery Method: Circle system utilized Preoxygenation: Pre-oxygenation with 100% oxygen Induction Type: IV induction Ventilation: Mask ventilation without difficulty Laryngoscope Size: Mac and 3 Grade View: Grade I Tube type: Oral Tube size: 7.0 mm Number of attempts: 1 Airway Equipment and Method: Stylet Placement Confirmation: ETT inserted through vocal cords under direct vision, positive ETCO2 and breath sounds checked- equal and bilateral Secured at: 22 cm Tube secured with: Tape Dental Injury: Teeth and Oropharynx as per pre-operative assessment

## 2021-06-13 NOTE — Op Note (Signed)
  Operative Note   06/13/2021  PRE-OP DIAGNOSIS: Desire for permanent sterilization  POST-OP DIAGNOSIS: same   PROCEDURE: Procedure(s): LAPAROSCOPIC BILATERAL SALPINGECTOMY   SURGEON: Annamarie Major, MD, FACOG  ANESTHESIA: General   ESTIMATED BLOOD LOSS: min  COMPLICATIONS: none  DISPOSITION: PACU - hemodynamically stable.  CONDITION: stable  FINDINGS: Laparoscopic survey of the abdomen revealed a grossly normal uterus, tubes, ovaries, liver edge, gallbladder edge and appendix, No intra-abdominal adhesions were noted.  PROCEDURE IN DETAIL: The patient was taken to the OR where anesthesia was administed. The patient was positioned in dorsal lithotomy in the [place] stirrups. The patient was then examined under anesthesia with the above noted findings. The patient was prepped and draped in the normal sterile fashion and bladder was drained using a red rubber cathater. Speculum exam normal, and a sponge stick was placed for manipulation purposes.  Attention was turned to the patient's abdomen where a 5 mm skin incision was made in the umbilical fold, after injection of local anesthesia. The Veress step needle was carefully introduced into the peritoneal cavity with placement confirmed using the hanging drop technique.  Pneumoperitoneum was obtained. The 5 mm port was then placed under direct visualization with the operative laparoscope  The above noted findings.  Trendelenburg.  A 5 mm trocar was then placed in the right lower quadrant under direct visualization with the laparoscope.  Right and left fallopian tubes are identified and followed out to their fimbria.  Each tube is excised utilizing the Harmonic scapel to include the fibria.  No injuries or bleeding was noted.  All instruments and ports were then removed from the abdomen after gas was expelled and patient was leveled.   The skin was closed with skin adhesive. The patient tolerated the procedure well. All counts were correct x 2.  The patient was transferred to the recovery room awake, alert and breathing independently.  Annamarie Major, MD, [facility], [facility] 06/13/2021  9:46 AM

## 2021-06-13 NOTE — Anesthesia Preprocedure Evaluation (Signed)
Anesthesia Evaluation  Patient identified by MRN, date of birth, ID band Patient awake    Reviewed: Allergy & Precautions, NPO status , Patient's Chart, lab work & pertinent test results  History of Anesthesia Complications (+) PONV  Airway Mallampati: II  TM Distance: >3 FB Neck ROM: full    Dental  (+) Teeth Intact   Pulmonary neg pulmonary ROS,    Pulmonary exam normal breath sounds clear to auscultation       Cardiovascular Exercise Tolerance: Good negative cardio ROS Normal cardiovascular exam Rhythm:Regular Rate:Normal     Neuro/Psych  Headaches, PSYCHIATRIC DISORDERS negative neurological ROS  negative psych ROS   GI/Hepatic negative GI ROS, Neg liver ROS,   Endo/Other  negative endocrine ROS  Renal/GU negative Renal ROS  negative genitourinary   Musculoskeletal negative musculoskeletal ROS (+)   Abdominal Normal abdominal exam  (+)   Peds negative pediatric ROS (+)  Hematology negative hematology ROS (+) Blood dyscrasia, anemia ,   Anesthesia Other Findings Past Medical History: No date: ADHD No date: Anemia No date: PONV (postoperative nausea and vomiting) No date: Type AB blood, Rh negative  Past Surgical History: No date: CHOLECYSTECTOMY 01/2005: GALLBLADDER SURGERY No date: SLEEVE GASTROPLASTY     Reproductive/Obstetrics negative OB ROS                             Anesthesia Physical Anesthesia Plan  ASA: 2  Anesthesia Plan: General   Post-op Pain Management:    Induction: Intravenous  PONV Risk Score and Plan: 1 and Ondansetron  Airway Management Planned: Oral ETT  Additional Equipment:   Intra-op Plan:   Post-operative Plan: Extubation in OR  Informed Consent: I have reviewed the patients History and Physical, chart, labs and discussed the procedure including the risks, benefits and alternatives for the proposed anesthesia with the patient or  authorized representative who has indicated his/her understanding and acceptance.     Dental Advisory Given  Plan Discussed with: CRNA and Surgeon  Anesthesia Plan Comments:         Anesthesia Quick Evaluation

## 2021-06-13 NOTE — Discharge Instructions (Addendum)
AMBULATORY SURGERY  DISCHARGE INSTRUCTIONS   The drugs that you were given will stay in your system until tomorrow so for the next 24 hours you should not:  Drive an automobile Make any legal decisions Drink any alcoholic beverage   You may resume regular meals tomorrow.  Today it is better to start with liquids and gradually work up to solid foods.  You may eat anything you prefer, but it is better to start with liquids, then soup and crackers, and gradually work up to solid foods.   Please notify your doctor immediately if you have any unusual bleeding, trouble breathing, redness and pain at the surgery site, drainage, fever, or pain not relieved by medication.    Additional Instructions: May alternate tylenol 500mg  every 8 hours with ibuprofen 800mg  every 8 hours (one or the other every 4 )    Please contact your physician with any problems or Same Day Surgery at [phone], Monday through Friday 6 am to 4 pm, or [place] at [facility] number at [phone].

## 2021-06-13 NOTE — H&P (Signed)
Signed                                           PRE-OPERATIVE HISTORY AND PHYSICAL EXAM   HPI:  Paula Sullivan is a 35 y.o. [id number] No LMP recorded. (Menstrual status: Irregular Periods).; she is being admitted for surgery related to requested sterilization.   PMHx:     Past Medical History:  Diagnosis Date   ADHD     Type AB blood, Rh negative           Past Surgical History:  Procedure Laterality Date   CHOLECYSTECTOMY       GALLBLADDER SURGERY   7-06   SLEEVE GASTROPLASTY             Family History  Problem Relation Age of Onset   Suicidality Father          2011   Diabetes Maternal Grandfather     Cancer Mother 16        hyst--? cervical    Social History         Tobacco Use   Smoking status: Never   Smokeless tobacco: Never  Vaping Use   Vaping Use: Never used  Substance Use Topics   Alcohol use: Yes      Comment: Rarely   Drug use: No      Current Outpatient Medications:    amphetamine-dextroamphetamine (ADDERALL) 20 MG tablet, Take 1 tablet (20 mg total) by mouth 2 (two) times daily., Disp: 60 tablet, Rfl: 0   azelastine (ASTELIN) 0.1 % nasal spray, PLACE 1 SPRAY INTO BOTH NOSTRILS 2 (TWO) TIMES DAILY. USE IN EACH NOSTRIL AS DIRECTED, Disp: 30 mL, Rfl: 1   cetirizine (ZYRTEC) 10 MG tablet, Take 1 tablet (10 mg total) by mouth daily., Disp: 90 tablet, Rfl: 3   escitalopram (LEXAPRO) 10 MG tablet, Take 1 tablet (10 mg total) by mouth daily., Disp: 90 tablet, Rfl: 1   fluconazole (DIFLUCAN) 150 MG tablet, Take 1 tablet by mouth once and may repeat in 3 days if symptoms persist, Disp: 3 tablet, Rfl: 0   traZODone (DESYREL) 50 MG tablet, Take 0.5-1 tablets (25-50 mg total) by mouth at bedtime as needed. for sleep, Disp: 90 tablet, Rfl: 1   amoxicillin-clavulanate (AUGMENTIN) 875-125 MG tablet, Take 1 tablet by mouth 2 (two) times daily. With food, Disp: 20 tablet, Rfl: 0   amphetamine-dextroamphetamine (ADDERALL) 20 MG tablet, Take 1 tablet (20 mg total) by  mouth 2 (two) times daily., Disp: 60 tablet, Rfl: 0   [START ON 05/22/2021] amphetamine-dextroamphetamine (ADDERALL) 20 MG tablet, Take 1 tablet (20 mg total) by mouth 2 (two) times daily., Disp: 60 tablet, Rfl: 0 Allergies: Latex   Review of Systems  Constitutional:  Negative for chills, fever and malaise/fatigue.  HENT:  Negative for congestion, sinus pain and sore throat.   Eyes:  Negative for blurred vision and pain.  Respiratory:  Negative for cough and wheezing.   Cardiovascular:  Negative for chest pain and leg swelling.  Gastrointestinal:  Negative for abdominal pain, constipation, diarrhea, heartburn, nausea and vomiting.  Genitourinary:  Negative for dysuria, frequency, hematuria and urgency.  Musculoskeletal:  Negative for back pain, joint pain, myalgias and neck pain.  Skin:  Negative for itching and rash.  Neurological:  Negative for dizziness, tremors and weakness.  Endo/Heme/Allergies:  Does not bruise/bleed easily.  Psychiatric/Behavioral:  Negative for  depression. The patient is not nervous/anxious and does not have insomnia.     Objective: BP 120/80   Ht 5\' 3"  (1.6 m)   Wt 189 lb (85.7 kg)   BMI 33.48 kg/m      Filed Weights    05/13/21 1525  Weight: 189 lb (85.7 kg)    Physical Exam Constitutional:      General: She is not in acute distress.    Appearance: She is well-developed.  HENT:     Head: Normocephalic and atraumatic. No laceration.     Right Ear: Hearing normal.     Left Ear: Hearing normal.     Mouth/Throat:     Pharynx: Uvula midline.  Eyes:     Pupils: Pupils are equal, round, and reactive to light.  Neck:     Thyroid: No thyromegaly.  Cardiovascular:     Rate and Rhythm: Normal rate and regular rhythm.     Heart sounds: No murmur heard.   No friction rub. No gallop.  Pulmonary:     Effort: Pulmonary effort is normal. No respiratory distress.     Breath sounds: Normal breath sounds. No wheezing.  Abdominal:     General: Bowel sounds are  normal. There is no distension.     Palpations: Abdomen is soft.     Tenderness: There is no abdominal tenderness. There is no rebound.  Musculoskeletal:        General: Normal range of motion.     Cervical back: Normal range of motion and neck supple.  Neurological:     Mental Status: She is alert and oriented to person, place, and time.     Cranial Nerves: No cranial nerve deficit.  Skin:    General: Skin is warm and dry.  Psychiatric:        Judgment: Judgment normal.  Vitals reviewed.      Assessment: 1. Sterilization consult   Plan Lap Partial Salpingectomy   I have had a careful discussion with this patient about all the options available and the risk/benefits of each. I have fully informed this patient that surgery may subject her to a variety of discomforts and risks: She understands that most patients have surgery with little difficulty, but problems can happen ranging from minor to fatal. These include nausea, vomiting, pain, bleeding, infection, poor healing, hernia, or formation of adhesions. Unexpected reactions may occur from any drug or anesthetic given. Unintended injury may occur to other pelvic or abdominal structures such as Fallopian tubes, ovaries, bladder, ureter (tube from kidney to bladder), or bowel. Nerves going from the pelvis to the legs may be injured. Any such injury may require immediate or later additional surgery to correct the problem. Excessive blood loss requiring transfusion is very unlikely but possible. Dangerous blood clots may form in the legs or lungs. Physical and sexual activity will be restricted in varying degrees for an indeterminate period of time but most often 2-6 weeks.  Finally, she understands that it is impossible to list every possible undesirable effect and that the condition for which surgery is done is not always cured or significantly improved, and in rare cases may be even worse.Ample time was given to answer all questions.   Barnett Applebaum, MD, [facility], [facility] 05/13/2021  3:50 PM

## 2021-06-13 NOTE — Anesthesia Postprocedure Evaluation (Signed)
Anesthesia Post Note  Patient: Paula Sullivan  Procedure(s) Performed: LAPAROSCOPIC BILATERAL SALPINGECTOMY (Bilateral: Pelvis)  Patient location during evaluation: PACU Anesthesia Type: General Level of consciousness: awake and awake and alert Pain management: pain level controlled Vital Signs Assessment: post-procedure vital signs reviewed and stable Respiratory status: spontaneous breathing and nonlabored ventilation Cardiovascular status: stable Anesthetic complications: no   No notable events documented.   Last Vitals:  Vitals:   06/13/21 1015 06/13/21 1020  BP:  118/79  Pulse: 61 62  Resp: 14 (!) 8  Temp:    SpO2: 99% 100%    Last Pain:  Vitals:   06/13/21 1010  TempSrc:   PainSc: 0-No pain                 VAN STAVEREN,Leshaun Biebel

## 2021-06-14 DIAGNOSIS — F4312 Post-traumatic stress disorder, chronic: Secondary | ICD-10-CM | POA: Diagnosis not present

## 2021-06-14 LAB — SURGICAL PATHOLOGY

## 2021-06-18 DIAGNOSIS — F4312 Post-traumatic stress disorder, chronic: Secondary | ICD-10-CM | POA: Diagnosis not present

## 2021-06-19 ENCOUNTER — Other Ambulatory Visit: Payer: Self-pay | Admitting: Physician Assistant

## 2021-06-19 DIAGNOSIS — F988 Other specified behavioral and emotional disorders with onset usually occurring in childhood and adolescence: Secondary | ICD-10-CM

## 2021-06-19 MED ORDER — AMPHETAMINE-DEXTROAMPHETAMINE 20 MG PO TABS: 20.0000 mg | ORAL_TABLET | Freq: Two times a day (BID) | ORAL | 0 refills | Status: DC

## 2021-06-21 DIAGNOSIS — F4312 Post-traumatic stress disorder, chronic: Secondary | ICD-10-CM | POA: Diagnosis not present

## 2021-06-26 ENCOUNTER — Encounter: Payer: Self-pay | Admitting: Obstetrics & Gynecology

## 2021-06-26 ENCOUNTER — Other Ambulatory Visit: Payer: Self-pay

## 2021-06-26 ENCOUNTER — Ambulatory Visit (INDEPENDENT_AMBULATORY_CARE_PROVIDER_SITE_OTHER): Payer: BC Managed Care – PPO | Admitting: Obstetrics & Gynecology

## 2021-06-26 VITALS — BP 120/80 | Ht 63.0 in | Wt 179.0 lb

## 2021-06-26 DIAGNOSIS — Z9851 Tubal ligation status: Secondary | ICD-10-CM

## 2021-06-26 NOTE — Progress Notes (Signed)
°  Postoperative Follow-up Patient presents post op from laparoscopy and tubal by salpingectomy  for requested sterilization, 2 weeks ago. Path: DIAGNOSIS:  A. FALLOPIAN TUBES, BILATERAL; LIGATION:  - COMPLETE CROSS-SECTIONS OF BILATERAL FALLOPIAN TUBES WITH NO  SIGNIFICANT PATHOLOGIC ALTERATION.  - NEGATIVE FOR MALIGNANCY. Images:    Subjective: Patient reports marked improvement in her preop symptoms. Eating a regular diet without difficulty. The patient is not having any pain.  Activity: normal activities of daily living. Patient reports additional symptom's since surgery of None.  Objective: BP 120/80    Ht 5\' 3"  (1.6 m)    Wt 179 lb (81.2 kg)    BMI 31.71 kg/m  Physical Exam Constitutional:      General: She is not in acute distress.    Appearance: She is well-developed.  Cardiovascular:     Rate and Rhythm: Normal rate.  Pulmonary:     Effort: Pulmonary effort is normal.  Abdominal:     General: There is no distension.     Palpations: Abdomen is soft.     Tenderness: There is no abdominal tenderness.     Comments: Incision Healing Well   Musculoskeletal:        General: Normal range of motion.  Neurological:     Mental Status: She is alert and oriented to person, place, and time.     Cranial Nerves: No cranial nerve deficit.  Skin:    General: Skin is warm and dry.    Assessment: s/p :  laparoscopy and tubal ligation progressing well  Plan: Patient has done well after surgery with no apparent complications.  I have discussed the post-operative course to date, and the expected progress moving forward.  The patient understands what complications to be concerned about.  I will see the patient in routine follow up, or sooner if needed.    Activity plan: No restriction.  06/26/2021, 8:40 AM

## 2021-07-03 ENCOUNTER — Telehealth: Payer: Self-pay

## 2021-07-03 NOTE — Telephone Encounter (Signed)
Left vm and sent mychart message to confirm 07/04/21 appointment-[name] °

## 2021-07-04 ENCOUNTER — Encounter: Payer: Medicaid Other | Admitting: Physician Assistant

## 2021-07-04 ENCOUNTER — Encounter: Payer: Self-pay | Admitting: Internal Medicine

## 2021-07-18 ENCOUNTER — Ambulatory Visit: Payer: BC Managed Care – PPO | Admitting: Physician Assistant

## 2021-07-18 ENCOUNTER — Encounter: Payer: Self-pay | Admitting: Physician Assistant

## 2021-07-18 ENCOUNTER — Other Ambulatory Visit: Payer: Self-pay

## 2021-07-18 DIAGNOSIS — F5101 Primary insomnia: Secondary | ICD-10-CM | POA: Diagnosis not present

## 2021-07-18 DIAGNOSIS — F411 Generalized anxiety disorder: Secondary | ICD-10-CM

## 2021-07-18 DIAGNOSIS — F988 Other specified behavioral and emotional disorders with onset usually occurring in childhood and adolescence: Secondary | ICD-10-CM | POA: Diagnosis not present

## 2021-07-18 MED ORDER — TRAZODONE HCL 50 MG PO TABS: 50.0000 mg | ORAL_TABLET | Freq: Every day | ORAL | 1 refills | Status: DC

## 2021-07-18 MED ORDER — AMPHETAMINE-DEXTROAMPHETAMINE 20 MG PO TABS: 20.0000 mg | ORAL_TABLET | Freq: Two times a day (BID) | ORAL | 0 refills | Status: DC

## 2021-07-18 MED ORDER — ESCITALOPRAM OXALATE 20 MG PO TABS: 20.0000 mg | ORAL_TABLET | Freq: Every day | ORAL | 2 refills | Status: DC

## 2021-07-18 NOTE — Progress Notes (Signed)
[facility] [address] [place], [place] [address]  Internal MEDICINE  Office Visit Note  Patient Name: Paula MalletMica M Sullivan  [birth year]  [id number]  Date of Service: 07/19/2021  Chief Complaint  Patient presents with   Follow-up   Anemia   ADHD    HPI Pt is here for routine follow up and med refill -Does note that she has been congested since last Wednesday--the whole family has, but are starting to improve. Did not take a covid test, but based on timeframe of symptoms was still seen in office -Also mentions some increased anxiety recently, had a panic attack one day. Thinks it may be stress from holidays and recent events and is wondering whether her Lexapro could be adjusted or an acute med added.  -Does well with 20mg  adderall BID and denies any side effects from this. She does have some difficulty with sleep normally and does well with trazodone at night. Knows not to take second tab of adderall too late in day as this will impact sleep further. -She is due for her CPE since she had to reschedule previous appt. Will complete fasting blood work previously ordered.  Current Medication: Outpatient Encounter Medications as of 07/18/2021  Medication Sig   azelastine (ASTELIN) 0.1 % nasal spray PLACE 1 SPRAY INTO BOTH NOSTRILS 2 (TWO) TIMES DAILY. USE IN EACH NOSTRIL AS DIRECTED   cetirizine (ZYRTEC) 10 MG tablet Take 1 tablet (10 mg total) by mouth daily.   escitalopram (LEXAPRO) 20 MG tablet Take 1 tablet (20 mg total) by mouth daily.   traZODone (DESYREL) 50 MG tablet Take 1 tablet (50 mg total) by mouth at bedtime.   [DISCONTINUED] amphetamine-dextroamphetamine (ADDERALL) 20 MG tablet Take 1 tablet (20 mg total) by mouth 2 (two) times daily.   [DISCONTINUED] escitalopram (LEXAPRO) 10 MG tablet Take 1 tablet (10 mg total) by mouth daily.   amphetamine-dextroamphetamine (ADDERALL) 20 MG tablet Take 1 tablet (20 mg total) by mouth 2 (two) times daily.   [DISCONTINUED]  norgestimate-ethinyl estradiol (ORTHO-CYCLEN) 0.25-35 MG-MCG tablet Take 1 tablet by mouth daily.   No facility-administered encounter medications on file as of 07/18/2021.    Surgical History: Past Surgical History:  Procedure Laterality Date   CHOLECYSTECTOMY     GALLBLADDER SURGERY  01/2005   LAPAROSCOPIC BILATERAL SALPINGECTOMY Bilateral 06/13/2021   Procedure: LAPAROSCOPIC BILATERAL SALPINGECTOMY;  Surgeon: Nadara MustardHarris, Robert P, MD;  Location: [facility] ORS;  Service: Gynecology;  Laterality: Bilateral;   SLEEVE GASTROPLASTY      Medical History: Past Medical History:  Diagnosis Date   ADHD    Anemia    PONV (postoperative nausea and vomiting)    Type AB blood, Rh negative     Family History: Family History  Problem Relation Age of Onset   Suicidality Father        2011   Diabetes Maternal Grandfather    Cancer Mother 126       hyst--? cervical    Social History   Socioeconomic History   Marital status: Single    Spouse name: Not on file   Number of children: 1   Years of education: Not on file   Highest education level: Not on file  Occupational History   Not on file  Tobacco Use   Smoking status: Never   Smokeless tobacco: Never  Vaping Use   Vaping Use: Never used  Substance and Sexual Activity   Alcohol use: Not Currently    Comment: Rarely   Drug use: No  Sexual activity: Yes    Birth control/protection: None  Other Topics Concern   Not on file  Social History Narrative   Not on file   Social Determinants of Health   Financial Resource Strain: Not on file  Food Insecurity: Not on file  Transportation Needs: Not on file  Physical Activity: Not on file  Stress: Not on file  Social Connections: Not on file  Intimate Partner Violence: Not on file      Review of Systems  Constitutional:  Negative for chills, fatigue and unexpected weight change.  HENT:  Negative for congestion, postnasal drip, rhinorrhea, sneezing and sore throat.   Eyes:  Negative  for redness.  Respiratory:  Negative for cough, chest tightness and shortness of breath.   Cardiovascular:  Negative for chest pain and palpitations.  Gastrointestinal:  Negative for abdominal pain, constipation, diarrhea, nausea and vomiting.  Genitourinary:  Negative for dysuria and frequency.  Musculoskeletal:  Negative for arthralgias, back pain, joint swelling and neck pain.  Skin:  Negative for rash.  Neurological: Negative.  Negative for tremors and numbness.  Hematological:  Negative for adenopathy. Does not bruise/bleed easily.  Psychiatric/Behavioral:  Negative for behavioral problems (Depression), sleep disturbance and suicidal ideas. The patient is nervous/anxious.    Vital Signs: BP 107/81    Pulse 92    Temp 98.7 F (37.1 C)    Resp 16    Ht 5\' 3"  (1.6 m)    Wt 173 lb 9.6 oz (78.7 kg)    SpO2 97%    BMI 30.75 kg/m    Physical Exam Vitals and nursing note reviewed.  Constitutional:      General: She is not in acute distress.    Appearance: She is well-developed. She is obese. She is not diaphoretic.  HENT:     Head: Normocephalic and atraumatic.     Mouth/Throat:     Pharynx: No oropharyngeal exudate.  Eyes:     Pupils: Pupils are equal, round, and reactive to light.  Neck:     Thyroid: No thyromegaly.     Vascular: No JVD.     Trachea: No tracheal deviation.  Cardiovascular:     Rate and Rhythm: Normal rate and regular rhythm.     Heart sounds: Normal heart sounds. No murmur heard.   No friction rub. No gallop.  Pulmonary:     Effort: Pulmonary effort is normal. No respiratory distress.     Breath sounds: No wheezing or rales.  Chest:     Chest wall: No tenderness.  Abdominal:     General: Bowel sounds are normal.     Palpations: Abdomen is soft.  Musculoskeletal:        General: Normal range of motion.     Cervical back: Normal range of motion and neck supple.  Lymphadenopathy:     Cervical: No cervical adenopathy.  Skin:    General: Skin is warm and  dry.  Neurological:     Mental Status: She is alert and oriented to person, place, and time.     Cranial Nerves: No cranial nerve deficit.  Psychiatric:        Behavior: Behavior normal.        Thought Content: Thought content normal.        Judgment: Judgment normal.       Assessment/Plan: 1. Attention deficit disorder (ADD) in adult May continue adderall as needed for concentration. - amphetamine-dextroamphetamine (ADDERALL) 20 MG tablet; Take 1 tablet (20 mg total) by mouth  2 (two) times daily.  Dispense: 60 tablet; Refill: 0 [place] Controlled Substance Database was reviewed by me for overdose risk score (ORS) Refilled Controlled medications today. Reviewed risks and possible side effects associated with taking Stimulants. Combination of these drugs with other psychotropic medications could cause dizziness and drowsiness. Pt needs to Monitor symptoms and exercise caution in driving and operating heavy machinery to avoid damages to oneself, to others and to the surroundings. Patient verbalized understanding in this matter. Dependence and abuse for these drugs will be monitored closely. A Controlled substance policy and procedure is on file which allows [facility] to order a urine drug screen test at any visit. Patient understands and agrees with the plan..  2. GAD (generalized anxiety disorder) Will increase lexapro to 20mg , may need to titrate up further or consider additional therapy if not improving - escitalopram (LEXAPRO) 20 MG tablet; Take 1 tablet (20 mg total) by mouth daily.  Dispense: 30 tablet; Refill: 2  3. Primary insomnia May continue trazodone before bed - traZODone (DESYREL) 50 MG tablet; Take 1 tablet (50 mg total) by mouth at bedtime.  Dispense: 90 tablet; Refill: 1   General Counseling: Luciel verbalizes understanding of the findings of todays visit and agrees with plan of treatment. I have discussed any further diagnostic evaluation that may be needed or  ordered today. We also reviewed her medications today. she has been encouraged to call the office with any questions or concerns that should arise related to todays visit.    No orders of the defined types were placed in this encounter.   Meds ordered this encounter  Medications   escitalopram (LEXAPRO) 20 MG tablet    Sig: Take 1 tablet (20 mg total) by mouth daily.    Dispense:  30 tablet    Refill:  2   amphetamine-dextroamphetamine (ADDERALL) 20 MG tablet    Sig: Take 1 tablet (20 mg total) by mouth 2 (two) times daily.    Dispense:  60 tablet    Refill:  0    Do not fill prior to 04/18/21   traZODone (DESYREL) 50 MG tablet    Sig: Take 1 tablet (50 mg total) by mouth at bedtime.    Dispense:  90 tablet    Refill:  1    This patient was seen by 06/18/21, PA-C in collaboration with Dr. Lynn Ito as a part of collaborative care agreement.   Total time spent:30 Minutes Time spent includes review of chart, medications, test results, and follow up plan with the patient.      Dr Beverely Risen Internal medicine

## 2021-07-25 ENCOUNTER — Other Ambulatory Visit: Payer: Self-pay | Admitting: Physician Assistant

## 2021-07-25 ENCOUNTER — Encounter: Payer: Self-pay | Admitting: Physician Assistant

## 2021-07-25 DIAGNOSIS — J0100 Acute maxillary sinusitis, unspecified: Secondary | ICD-10-CM

## 2021-07-25 MED ORDER — AZITHROMYCIN 250 MG PO TABS: ORAL_TABLET | ORAL | 0 refills | Status: AC

## 2021-07-30 ENCOUNTER — Encounter: Payer: Self-pay | Admitting: Physician Assistant

## 2021-07-30 DIAGNOSIS — F4312 Post-traumatic stress disorder, chronic: Secondary | ICD-10-CM | POA: Diagnosis not present

## 2021-08-06 DIAGNOSIS — F4312 Post-traumatic stress disorder, chronic: Secondary | ICD-10-CM | POA: Diagnosis not present

## 2021-08-09 ENCOUNTER — Other Ambulatory Visit: Payer: Self-pay | Admitting: Physician Assistant

## 2021-08-09 DIAGNOSIS — F411 Generalized anxiety disorder: Secondary | ICD-10-CM

## 2021-08-12 ENCOUNTER — Telehealth: Payer: BC Managed Care – PPO | Admitting: Physician Assistant

## 2021-08-12 ENCOUNTER — Encounter: Payer: Self-pay | Admitting: Physician Assistant

## 2021-08-12 VITALS — Temp 98.1°F | Resp 16 | Ht 63.0 in

## 2021-08-12 DIAGNOSIS — D510 Vitamin B12 deficiency anemia due to intrinsic factor deficiency: Secondary | ICD-10-CM | POA: Diagnosis not present

## 2021-08-12 DIAGNOSIS — E559 Vitamin D deficiency, unspecified: Secondary | ICD-10-CM | POA: Diagnosis not present

## 2021-08-12 DIAGNOSIS — J0101 Acute recurrent maxillary sinusitis: Secondary | ICD-10-CM

## 2021-08-12 DIAGNOSIS — R5383 Other fatigue: Secondary | ICD-10-CM | POA: Diagnosis not present

## 2021-08-12 DIAGNOSIS — E782 Mixed hyperlipidemia: Secondary | ICD-10-CM | POA: Diagnosis not present

## 2021-08-12 MED ORDER — AZITHROMYCIN 250 MG PO TABS: ORAL_TABLET | ORAL | 0 refills | Status: DC

## 2021-08-12 NOTE — Progress Notes (Signed)
[facility] [address] [place], [place] [address]  Internal MEDICINE  Telephone Visit  Patient Name: Paula Sullivan  [id number]  [id number]  Date of Service: 08/12/2021  I connected with the patient at 3:48 by telephone and verified the patients identity using two identifiers.   I discussed the limitations, risks, security and privacy concerns of performing an evaluation and management service by telephone and the availability of in person appointments. I also discussed with the patient that there may be a patient responsible charge related to the service.  The patient expressed understanding and agrees to proceed.    Chief Complaint  Patient presents with   Telephone Screen   Telephone Assessment   sinus infection    Congestion, sinus pressure, headache     HPI Pt is here for virtual sick visit -Did a round of zpak for 5 day course about 2-3 weeks ago for sinus infection and states it resolved for a few days, but then came back again. Has a sinus infection this time of year every year -Continued to have sinus pressure and congestion and will cough up mucus -Denies wheezing or SOB, denies fever, chills, or body aches -she has been taking zyrtec, nyquil, pseudoephed as different OTC options but hasn't helped. Will have her take flonase and continue zyrtec and mucinex  Current Medication: Outpatient Encounter Medications as of 08/12/2021  Medication Sig   amphetamine-dextroamphetamine (ADDERALL) 20 MG tablet Take 1 tablet (20 mg total) by mouth 2 (two) times daily.   azelastine (ASTELIN) 0.1 % nasal spray PLACE 1 SPRAY INTO BOTH NOSTRILS 2 (TWO) TIMES DAILY. USE IN EACH NOSTRIL AS DIRECTED   azithromycin (ZITHROMAX) 250 MG tablet Take one tab a day for 10 days for uri   cetirizine (ZYRTEC) 10 MG tablet Take 1 tablet (10 mg total) by mouth daily.   escitalopram (LEXAPRO) 20 MG tablet TAKE 1 TABLET BY MOUTH EVERY DAY   traZODone (DESYREL) 50 MG tablet Take 1 tablet (50 mg  total) by mouth at bedtime.   [DISCONTINUED] norgestimate-ethinyl estradiol (ORTHO-CYCLEN) 0.25-35 MG-MCG tablet Take 1 tablet by mouth daily.   No facility-administered encounter medications on file as of 08/12/2021.    Surgical History: Past Surgical History:  Procedure Laterality Date   CHOLECYSTECTOMY     GALLBLADDER SURGERY  01/2005   LAPAROSCOPIC BILATERAL SALPINGECTOMY Bilateral 06/13/2021   Procedure: LAPAROSCOPIC BILATERAL SALPINGECTOMY;  Surgeon: Nadara Mustard, MD;  Location: [facility] ORS;  Service: Gynecology;  Laterality: Bilateral;   SLEEVE GASTROPLASTY      Medical History: Past Medical History:  Diagnosis Date   ADHD    Anemia    PONV (postoperative nausea and vomiting)    Type AB blood, Rh negative     Family History: Family History  Problem Relation Age of Onset   Suicidality Father        2011   Diabetes Maternal Grandfather    Cancer Mother 49       hyst--? cervical    Social History   Socioeconomic History   Marital status: Single    Spouse name: Not on file   Number of children: 1   Years of education: Not on file   Highest education level: Not on file  Occupational History   Not on file  Tobacco Use   Smoking status: Never   Smokeless tobacco: Never  Vaping Use   Vaping Use: Never used  Substance and Sexual Activity   Alcohol use: Not Currently    Comment:  Rarely   Drug use: No   Sexual activity: Yes    Birth control/protection: None  Other Topics Concern   Not on file  Social History Narrative   Not on file   Social Determinants of Health   Financial Resource Strain: Not on file  Food Insecurity: Not on file  Transportation Needs: Not on file  Physical Activity: Not on file  Stress: Not on file  Social Connections: Not on file  Intimate Partner Violence: Not on file      Review of Systems  Constitutional:  Negative for chills, fatigue and fever.  HENT:  Positive for congestion, postnasal drip and sinus pressure. Negative  for mouth sores.   Respiratory:  Positive for cough. Negative for shortness of breath and wheezing.   Cardiovascular:  Negative for chest pain.  Genitourinary:  Negative for flank pain.  Musculoskeletal:  Negative for arthralgias.  Neurological:  Positive for headaches.  Psychiatric/Behavioral: Negative.     Vital Signs: Temp 98.1 F (36.7 C)    Resp 16    Ht 5\' 3"  (1.6 m)    BMI 30.75 kg/m    Observation/Objective:  Pt is able to carry out conversation   Assessment/Plan: 1. Acute recurrent maxillary sinusitis Will start a longer round of zpak and have pt take flonase, zyrtec and mucinex as needed. - azithromycin (ZITHROMAX) 250 MG tablet; Take one tab a day for 10 days for uri  Dispense: 10 tablet; Refill: 0   General Counseling: Shantoya verbalizes understanding of the findings of today's phone visit and agrees with plan of treatment. I have discussed any further diagnostic evaluation that may be needed or ordered today. We also reviewed her medications today. she has been encouraged to call the office with any questions or concerns that should arise related to todays visit.    No orders of the defined types were placed in this encounter.   Meds ordered this encounter  Medications   azithromycin (ZITHROMAX) 250 MG tablet    Sig: Take one tab a day for 10 days for uri    Dispense:  10 tablet    Refill:  0    Time spent:25 Minutes    Dr Internal medicine

## 2021-08-15 ENCOUNTER — Other Ambulatory Visit: Payer: Self-pay | Admitting: Physician Assistant

## 2021-08-15 ENCOUNTER — Encounter: Payer: Self-pay | Admitting: Physician Assistant

## 2021-08-15 DIAGNOSIS — F988 Other specified behavioral and emotional disorders with onset usually occurring in childhood and adolescence: Secondary | ICD-10-CM

## 2021-08-15 MED ORDER — AMPHETAMINE-DEXTROAMPHETAMINE 20 MG PO TABS: 20.0000 mg | ORAL_TABLET | Freq: Two times a day (BID) | ORAL | 0 refills | Status: DC

## 2021-08-16 ENCOUNTER — Telehealth: Payer: Self-pay

## 2021-08-16 NOTE — Telephone Encounter (Signed)
Left vm to confirm 08/19/21 appointment-Paula Sullivan °

## 2021-08-18 LAB — LIPID PANEL WITH LDL/HDL RATIO
Cholesterol, Total: 186 mg/dL (ref 100–199)
HDL: 46 mg/dL (ref >39)
LDL Chol Calc (NIH): 117 mg/dL — ABNORMAL HIGH (ref 0–99)
LDL/HDL Ratio: 2.5 ratio (ref 0.0–3.2)
Triglycerides: 130 mg/dL (ref 0–149)
VLDL Cholesterol Cal: 23 mg/dL (ref 5–40)

## 2021-08-18 LAB — IRON,TIBC AND FERRITIN PANEL
Ferritin: 122 ng/mL (ref 15–150)
Iron Saturation: 24 % (ref 15–55)
Iron: 73 ug/dL (ref 27–159)
Total Iron Binding Capacity: 303 ug/dL (ref 250–450)
UIBC: 230 ug/dL (ref 131–425)

## 2021-08-18 LAB — CBC WITH DIFFERENTIAL/PLATELET
Basophils Absolute: 0 10*3/uL (ref 0.0–0.2)
Basos: 1 %
EOS (ABSOLUTE): 0.1 10*3/uL (ref 0.0–0.4)
Eos: 2 %
Hematocrit: 41.9 % (ref 34.0–46.6)
Hemoglobin: 13.9 g/dL (ref 11.1–15.9)
Immature Grans (Abs): 0 10*3/uL (ref 0.0–0.1)
Immature Granulocytes: 0 %
Lymphocytes Absolute: 1.3 10*3/uL (ref 0.7–3.1)
Lymphs: 34 %
MCH: 29.6 pg (ref 26.6–33.0)
MCHC: 33.2 g/dL (ref 31.5–35.7)
MCV: 89 fL (ref 79–97)
Monocytes Absolute: 0.4 10*3/uL (ref 0.1–0.9)
Monocytes: 10 %
Neutrophils Absolute: 2 10*3/uL (ref 1.4–7.0)
Neutrophils: 53 %
Platelets: 226 10*3/uL (ref 150–450)
RBC: 4.69 x10E6/uL (ref 3.77–5.28)
RDW: 13.1 % (ref 11.7–15.4)
WBC: 3.8 10*3/uL (ref 3.4–10.8)

## 2021-08-18 LAB — VITAMIN E
Vitamin E (Alpha Tocopherol): 12.7 mg/L (ref 5.9–19.4)
Vitamin E(Gamma Tocopherol): 2.1 mg/L (ref 0.7–4.9)

## 2021-08-18 LAB — COMPREHENSIVE METABOLIC PANEL
ALT: 11 IU/L (ref 0–32)
AST: 17 IU/L (ref 0–40)
Albumin/Globulin Ratio: 2.2 (ref 1.2–2.2)
Albumin: 4.9 g/dL — ABNORMAL HIGH (ref 3.8–4.8)
Alkaline Phosphatase: 84 IU/L (ref 44–121)
BUN/Creatinine Ratio: 12 (ref 9–23)
BUN: 9 mg/dL (ref 6–20)
Bilirubin Total: 0.7 mg/dL (ref 0.0–1.2)
CO2: 24 mmol/L (ref 20–29)
Calcium: 9.4 mg/dL (ref 8.7–10.2)
Chloride: 105 mmol/L (ref 96–106)
Creatinine, Ser: 0.75 mg/dL (ref 0.57–1.00)
Globulin, Total: 2.2 g/dL (ref 1.5–4.5)
Glucose: 93 mg/dL (ref 70–99)
Potassium: 4.5 mmol/L (ref 3.5–5.2)
Sodium: 144 mmol/L (ref 134–144)
Total Protein: 7.1 g/dL (ref 6.0–8.5)
eGFR: 105 mL/min/{1.73_m2} (ref >59)

## 2021-08-18 LAB — TSH+FREE T4
Free T4: 0.95 ng/dL (ref 0.82–1.77)
TSH: 1.62 u[IU]/mL (ref 0.450–4.500)

## 2021-08-18 LAB — B12 AND FOLATE PANEL
Folate: 7.2 ng/mL (ref >3.0)
Vitamin B-12: 348 pg/mL (ref 232–1245)

## 2021-08-18 LAB — VITAMIN D 25 HYDROXY (VIT D DEFICIENCY, FRACTURES): Vit D, 25-Hydroxy: 40.6 ng/mL (ref 30.0–100.0)

## 2021-08-18 LAB — MAGNESIUM: Magnesium: 2 mg/dL (ref 1.6–2.3)

## 2021-08-18 LAB — VITAMIN A: Vitamin A: 35.3 ug/dL (ref 18.9–57.3)

## 2021-08-19 ENCOUNTER — Encounter: Payer: Self-pay | Admitting: Physician Assistant

## 2021-08-19 ENCOUNTER — Other Ambulatory Visit: Payer: Self-pay

## 2021-08-19 ENCOUNTER — Ambulatory Visit: Payer: BC Managed Care – PPO | Admitting: Physician Assistant

## 2021-08-19 DIAGNOSIS — R300 Dysuria: Secondary | ICD-10-CM

## 2021-08-19 DIAGNOSIS — F5101 Primary insomnia: Secondary | ICD-10-CM | POA: Diagnosis not present

## 2021-08-19 DIAGNOSIS — F988 Other specified behavioral and emotional disorders with onset usually occurring in childhood and adolescence: Secondary | ICD-10-CM

## 2021-08-19 DIAGNOSIS — F411 Generalized anxiety disorder: Secondary | ICD-10-CM

## 2021-08-19 DIAGNOSIS — Z0001 Encounter for general adult medical examination with abnormal findings: Secondary | ICD-10-CM | POA: Diagnosis not present

## 2021-08-19 DIAGNOSIS — E7800 Pure hypercholesterolemia, unspecified: Secondary | ICD-10-CM | POA: Diagnosis not present

## 2021-08-19 MED ORDER — ESCITALOPRAM OXALATE 10 MG PO TABS: 10.0000 mg | ORAL_TABLET | Freq: Every day | ORAL | 1 refills | Status: DC

## 2021-08-19 NOTE — Progress Notes (Signed)
[facility] [place], [place] [address]  Internal MEDICINE  Office Visit Note  Patient Name: Paula Sullivan  [id number]  [id number]  Date of Service: 08/28/2021  Chief Complaint  Patient presents with   Annual Exam   Anemia     HPI Pt is here for routine health maintenance examination -She is doing well and finally feeling better since sinus infection -She has been doing well on adderall however due to shortage has been reducing use while looking for pharmacy that has it in stock. Discussed that alternative med may be needed and she might benefit from wellbutrin if unable to get adderall. -Doing well with lexapro but is taking 40m and needs dose adjusted on script -reviewed labs which overall looked good, but did have elevated cholesterol. Will work on diet and exercise and may consider fish oil supplement. If not improving in future may need to consider statin -she is up to date on pap, sees OBGYN for this -continue to lose weight since weight loss surgery  Current Medication: Outpatient Encounter Medications as of 08/19/2021  Medication Sig   azelastine (ASTELIN) 0.1 % nasal spray PLACE 1 SPRAY INTO BOTH NOSTRILS 2 (TWO) TIMES DAILY. USE IN EACH NOSTRIL AS DIRECTED   azithromycin (ZITHROMAX) 250 MG tablet Take one tab a day for 10 days for uri   cetirizine (ZYRTEC) 10 MG tablet Take 1 tablet (10 mg total) by mouth daily.   escitalopram (LEXAPRO) 10 MG tablet Take 1 tablet (10 mg total) by mouth daily.   traZODone (DESYREL) 50 MG tablet Take 1 tablet (50 mg total) by mouth at bedtime.   [DISCONTINUED] amphetamine-dextroamphetamine (ADDERALL) 20 MG tablet Take 1 tablet (20 mg total) by mouth 2 (two) times daily.   [DISCONTINUED] escitalopram (LEXAPRO) 20 MG tablet TAKE 1 TABLET BY MOUTH EVERY DAY   [DISCONTINUED] norgestimate-ethinyl estradiol (ORTHO-CYCLEN) 0.25-35 MG-MCG tablet Take 1 tablet by mouth daily.   No facility-administered encounter  medications on file as of 08/19/2021.    Surgical History: Past Surgical History:  Procedure Laterality Date   CHOLECYSTECTOMY     GALLBLADDER SURGERY  01/2005   LAPAROSCOPIC BILATERAL SALPINGECTOMY Bilateral 06/13/2021   Procedure: LAPAROSCOPIC BILATERAL SALPINGECTOMY;  Surgeon: HGae Dry MD;  Location: [facility] ORS;  Service: Gynecology;  Laterality: Bilateral;   SLEEVE GASTROPLASTY      Medical History: Past Medical History:  Diagnosis Date   ADHD    Anemia    PONV (postoperative nausea and vomiting)    Type AB blood, Rh negative     Family History: Family History  Problem Relation Age of Onset   Suicidality Father        2011   Diabetes Maternal Grandfather    Cancer Mother 222      hyst--? cervical      Review of Systems  Constitutional:  Negative for chills, fatigue and unexpected weight change.  HENT:  Negative for congestion, postnasal drip, rhinorrhea, sneezing and sore throat.   Eyes:  Negative for redness.  Respiratory:  Negative for cough, chest tightness and shortness of breath.   Cardiovascular:  Negative for chest pain and palpitations.  Gastrointestinal:  Negative for abdominal pain, constipation, diarrhea, nausea and vomiting.  Genitourinary:  Negative for dysuria and frequency.  Musculoskeletal:  Negative for arthralgias, back pain, joint swelling and neck pain.  Skin:  Negative for rash.  Neurological: Negative.  Negative for tremors and numbness.  Hematological:  Negative for adenopathy. Does not bruise/bleed easily.  Psychiatric/Behavioral:  Negative for behavioral problems (Depression), sleep disturbance and suicidal ideas. The patient is not nervous/anxious.     Vital Signs: BP 110/73    Pulse 78    Temp 98.4 F (36.9 C)    Resp 16    Ht 5' 3"  (1.6 m)    Wt 168 lb 6.4 oz (76.4 kg)    SpO2 97%    BMI 29.83 kg/m    Physical Exam Vitals and nursing note reviewed.  Constitutional:      General: She is not in acute distress.    Appearance:  She is well-developed. She is not diaphoretic.  HENT:     Head: Normocephalic and atraumatic.     Mouth/Throat:     Pharynx: No oropharyngeal exudate.  Eyes:     Pupils: Pupils are equal, round, and reactive to light.  Neck:     Thyroid: No thyromegaly.     Vascular: No JVD.     Trachea: No tracheal deviation.  Cardiovascular:     Rate and Rhythm: Normal rate and regular rhythm.     Heart sounds: Normal heart sounds. No murmur heard.   No friction rub. No gallop.  Pulmonary:     Effort: Pulmonary effort is normal. No respiratory distress.     Breath sounds: No wheezing or rales.  Chest:     Chest wall: No tenderness.  Breasts:    Right: Normal. No mass.     Left: Normal. No mass.  Abdominal:     General: Bowel sounds are normal.     Palpations: Abdomen is soft.     Tenderness: There is no abdominal tenderness.  Musculoskeletal:        General: Normal range of motion.     Cervical back: Normal range of motion and neck supple.  Lymphadenopathy:     Cervical: No cervical adenopathy.  Skin:    General: Skin is warm and dry.  Neurological:     Mental Status: She is alert and oriented to person, place, and time.     Cranial Nerves: No cranial nerve deficit.  Psychiatric:        Behavior: Behavior normal.        Thought Content: Thought content normal.        Judgment: Judgment normal.     LABS: Recent Results (from the past 2160 hour(s))  CBC     Status: None   Collection Time: 06/04/21  9:25 AM  Result Value Ref Range   WBC 6.6 4.0 - 10.5 K/uL   RBC 4.65 3.87 - 5.11 MIL/uL   Hemoglobin 13.6 12.0 - 15.0 g/dL   HCT 41.5 36.0 - 46.0 %   MCV 89.2 80.0 - 100.0 fL   MCH 29.2 26.0 - 34.0 pg   MCHC 32.8 30.0 - 36.0 g/dL   RDW 13.1 11.5 - 15.5 %   Platelets 219 150 - 400 K/uL   nRBC 0.0 0.0 - 0.2 %    Comment: Performed at [facility], [address] [place], [place] [address]  Type and screen     Status: None   Collection Time: 06/04/21  9:25 AM  Result  Value Ref Range   ABO/RH(D) AB NEG    Antibody Screen NEG    Sample Expiration 06/18/2021,2359    Extend sample reason      NO TRANSFUSIONS OR PREGNANCY IN THE PAST 3 MONTHS Performed at [facility], [address], [place], [place] [address]   Pregnancy, urine POC  Status: None   Collection Time: 06/13/21  7:48 AM  Result Value Ref Range   Preg Test, Ur NEGATIVE NEGATIVE    Comment:        THE SENSITIVITY OF THIS METHODOLOGY IS >24 mIU/mL   Surgical pathology     Status: None   Collection Time: 06/13/21  9:32 AM  Result Value Ref Range   SURGICAL PATHOLOGY      SURGICAL PATHOLOGY CASE: [id number] PATIENT: Petina Orvis Surgical Pathology Report     Specimen Submitted: A. Fallopian tubes, bilateral  Clinical History: Sterility      DIAGNOSIS: A. FALLOPIAN TUBES, BILATERAL; LIGATION: - COMPLETE CROSS-SECTIONS OF BILATERAL FALLOPIAN TUBES WITH NO SIGNIFICANT PATHOLOGIC ALTERATION. - NEGATIVE FOR MALIGNANCY.   GROSS DESCRIPTION: A. Labeled: Bilateral fallopian tubes Received: Fresh Collection time: 9:32 AM on 06/13/2021 Placed into formalin time: 10:08 AM on 06/13/2021 Size: Fallopian tube A: 5.5 cm long by 0.8 cm in diameter; fallopian tube B: 4.8 cm long by 0.6 cm in diameter Other findings: The fallopian tubes are fimbriated with tan-pink, smooth, and glistening serosal surfaces.  Fallopian tube B has a 0.3 x 0.2 x 0.2 cm paratubal cyst.  The lumens are pinpoint and patent. Fallopian tube B is inked blue for differentiation.  Block summary: 1 - fallopian tube A fimbria, longitudinally bisected and submitted  entirely, with representative cross-sections 2 - fallopian tube B fimbria, longitudinally bisected and submitted entirely, with representative cross-sections  RB 06/13/2021   Final Diagnosis performed by Betsy Pries, MD.   Electronically signed 06/14/2021 9:43:50AM The electronic signature indicates that the named Attending  Pathologist has evaluated the specimen Technical component performed at [facility], [address] [place], [place] [address] Lab: [phone] Dir: Rush Farmer, MD, MMM  Professional component performed at [facility], [facility], [place], [place], [place] [address] Lab: [phone] Dir: Kathi Simpers, MD   CBC w/Diff/Platelet     Status: None   Collection Time: 08/12/21  9:59 AM  Result Value Ref Range   WBC 3.8 3.4 - 10.8 x10E3/uL   RBC 4.69 3.77 - 5.28 x10E6/uL   Hemoglobin 13.9 11.1 - 15.9 g/dL   Hematocrit 41.9 34.0 - 46.6 %   MCV 89 79 - 97 fL   MCH 29.6 26.6 - 33.0 pg   MCHC 33.2 31.5 - 35.7 g/dL   RDW 13.1 11.7 - 15.4 %   Platelets 226 150 - 450 x10E3/uL   Neutrophils 53 Not Estab. %   Lymphs 34 Not Estab. %   Monocytes 10 Not Estab. %   Eos 2 Not Estab. %   Basos 1 Not Estab. %   Neutrophils Absolute 2.0 1.4 - 7.0 x10E3/uL   Lymphocytes Absolute 1.3 0.7 - 3.1 x10E3/uL   Monocytes Absolute 0.4 0.1 - 0.9 x10E3/uL   EOS (ABSOLUTE) 0.1 0.0 - 0.4 x10E3/uL   Basophils Absolute 0.0 0.0 - 0.2 x10E3/uL   Immature Granulocytes 0 Not Estab. %   Immature Grans (Abs) 0.0 0.0 - 0.1 x10E3/uL  Comprehensive metabolic panel     Status: Abnormal   Collection Time: 08/12/21  9:59 AM  Result Value Ref Range   Glucose 93 70 - 99 mg/dL   BUN 9 6 - 20 mg/dL   Creatinine, Ser 0.75 0.57 - 1.00 mg/dL   eGFR 105 >59 mL/min/1.73   BUN/Creatinine Ratio 12 9 - 23   Sodium 144 134 - 144 mmol/L   Potassium 4.5 3.5 - 5.2 mmol/L   Chloride 105 96 - 106 mmol/L  CO2 24 20 - 29 mmol/L   Calcium 9.4 8.7 - 10.2 mg/dL   Total Protein 7.1 6.0 - 8.5 g/dL   Albumin 4.9 (H) 3.8 - 4.8 g/dL   Globulin, Total 2.2 1.5 - 4.5 g/dL   Albumin/Globulin Ratio 2.2 1.2 - 2.2   Bilirubin Total 0.7 0.0 - 1.2 mg/dL   Alkaline Phosphatase 84 44 - 121 IU/L   AST 17 0 - 40 IU/L   ALT 11 0 - 32 IU/L  Lipid Panel With LDL/HDL Ratio     Status: Abnormal   Collection Time: 08/12/21  9:59 AM   Result Value Ref Range   Cholesterol, Total 186 100 - 199 mg/dL   Triglycerides 130 0 - 149 mg/dL   HDL 46 >39 mg/dL   VLDL Cholesterol Cal 23 5 - 40 mg/dL   LDL Chol Calc (NIH) 117 (H) 0 - 99 mg/dL   LDL/HDL Ratio 2.5 0.0 - 3.2 ratio    Comment:                                     LDL/HDL Ratio                                             Men  Women                               1/2 Avg.Risk  1.0    1.5                                   Avg.Risk  3.6    3.2                                2X Avg.Risk  6.2    5.0                                3X Avg.Risk  8.0    6.1   Iron, TIBC and Ferritin Panel     Status: None   Collection Time: 08/12/21  9:59 AM  Result Value Ref Range   Total Iron Binding Capacity 303 250 - 450 ug/dL   UIBC 230 131 - 425 ug/dL   Iron 73 27 - 159 ug/dL   Iron Saturation 24 15 - 55 %   Ferritin 122 15 - 150 ng/mL  B12 and Folate Panel     Status: None   Collection Time: 08/12/21  9:59 AM  Result Value Ref Range   Vitamin B-12 348 232 - 1,245 pg/mL   Folate 7.2 >3.0 ng/mL    Comment: A serum folate concentration of less than 3.1 ng/mL is considered to represent clinical deficiency.   VITAMIN D 25 Hydroxy (Vit-D Deficiency, Fractures)     Status: None   Collection Time: 08/12/21  9:59 AM  Result Value Ref Range   Vit D, 25-Hydroxy 40.6 30.0 - 100.0 ng/mL    Comment: Vitamin D deficiency has been defined by the [place] practice guideline as a level of  serum 25-OH vitamin D less than 20 ng/mL (1,2). The Endocrine Society went on to further define vitamin D insufficiency as a level between 21 and 29 ng/mL (2). 1. IOM (Institute of Medicine). 2010. Dietary reference    intakes for calcium and D. [place]: The    [employer]. 2. Holick MF, Binkley [place], Bischoff-Ferrari HA, et al.    Evaluation, treatment, and prevention of vitamin D    deficiency: an Endocrine Society clinical practice    guideline.  JCEM. 2011 Jul; 96(7):1911-30.   TSH + free T4     Status: None   Collection Time: 08/12/21  9:59 AM  Result Value Ref Range   TSH 1.620 0.450 - 4.500 uIU/mL   Free T4 0.95 0.82 - 1.77 ng/dL  Magnesium     Status: None   Collection Time: 08/12/21  9:59 AM  Result Value Ref Range   Magnesium 2.0 1.6 - 2.3 mg/dL  Vitamin A     Status: None   Collection Time: 08/12/21  9:59 AM  Result Value Ref Range   Vitamin A 35.3 18.9 - 57.3 ug/dL    Comment: Reference intervals for vitamin A determined from [facility] internal studies. Individuals with vitamin A less than 20 ug/dL are considered vitamin A deficient and those with serum concentrations less than 10 ug/dL are considered severely deficient. This test was developed and its performance characteristics determined by [facility]. It has not been cleared or approved by the Food and Drug Administration.   Vitamin E     Status: None   Collection Time: 08/12/21  9:59 AM  Result Value Ref Range   Vitamin E (Alpha Tocopherol) 12.7 5.9 - 19.4 mg/L   Vitamin E(Gamma Tocopherol) 2.1 0.7 - 4.9 mg/L    Comment: Reference intervals for alpha and gamma-tocopherol determined from [facility] and Nutrition Examination Survey, 2005-2006. Individuals with alpha-tocopherol levels less than 5.0 mg/L are considered vitamin E deficient.   UA/M w/rflx Culture, Routine     Status: Abnormal   Collection Time: 08/19/21  3:08 PM   Specimen: Urine   Urine  Result Value Ref Range   Specific Gravity, UA      >=1.030 (A) 1.005 - 1.030   pH, UA 5.0 5.0 - 7.5   Color, UA Yellow Yellow   Appearance Ur Turbid (A) Clear   Leukocytes,UA Trace (A) Negative   Protein,UA Trace Negative/Trace   Glucose, UA Negative Negative   Ketones, UA Trace (A) Negative   RBC, UA Negative Negative   Bilirubin, UA Negative Negative   Urobilinogen, Ur 1.0 0.2 - 1.0 mg/dL   Nitrite, UA Negative Negative   Microscopic Examination See below:     Comment: Microscopic was indicated  and was performed.   Urinalysis Reflex Comment     Comment: This specimen has reflexed to a Urine Culture.  Microscopic Examination     Status: Abnormal   Collection Time: 08/19/21  3:08 PM   Urine  Result Value Ref Range   WBC, UA 6-10 (A) 0 - 5 /hpf   RBC None seen 0 - 2 /hpf   Epithelial Cells (non renal) >10 (A) 0 - 10 /hpf   Casts None seen None seen /lpf   Crystals Present (A) N/A   Crystal Type Calcium Oxalate N/A   Bacteria, UA Moderate (A) None seen/Few  Urine Culture, Reflex     Status: None   Collection Time: 08/19/21  3:08 PM   Urine  Result Value Ref Range  Urine Culture, Routine Final report    Organism ID, Bacteria Comment     Comment: Mixed urogenital flora 10,000-25,000 colony forming units per mL         Assessment/Plan: 1. Encounter for general adult medical examination with abnormal findings CPE performed, routine labs reviewed PHM up to date  2. Attention deficit disorder (ADD) in adult May continue adderall as before  3. GAD (generalized anxiety disorder) May continue lexapro - escitalopram (LEXAPRO) 10 MG tablet; Take 1 tablet (10 mg total) by mouth daily.  Dispense: 90 tablet; Refill: 1  4. Primary insomnia May continue trazodone before bed  5. Pure hypercholesterolemia Will work on diet and exercise and may consider fish oil supplement. Will continue to monitor  6. Dysuria - UA/M w/rflx Culture, Routine   General Counseling: Malaysha verbalizes understanding of the findings of todays visit and agrees with plan of treatment. I have discussed any further diagnostic evaluation that may be needed or ordered today. We also reviewed her medications today. she has been encouraged to call the office with any questions or concerns that should arise related to todays visit.    Counseling:    Orders Placed This Encounter  Procedures   Microscopic Examination   Urine Culture, Reflex   UA/M w/rflx Culture, Routine    Meds ordered this  encounter  Medications   escitalopram (LEXAPRO) 10 MG tablet    Sig: Take 1 tablet (10 mg total) by mouth daily.    Dispense:  90 tablet    Refill:  1    Pt will call when needed    This patient was seen by Drema Dallas, PA-C in collaboration with Dr. Clayborn Bigness as a part of collaborative care agreement.  Total time spent:30 Minutes  Time spent includes review of chart, medications, test results, and follow up plan with the patient.     Lavera Guise, MD  Internal Medicine

## 2021-08-21 ENCOUNTER — Other Ambulatory Visit: Payer: Self-pay | Admitting: Physician Assistant

## 2021-08-21 ENCOUNTER — Telehealth: Payer: Self-pay

## 2021-08-21 DIAGNOSIS — F988 Other specified behavioral and emotional disorders with onset usually occurring in childhood and adolescence: Secondary | ICD-10-CM

## 2021-08-21 MED ORDER — AMPHETAMINE-DEXTROAMPHETAMINE 20 MG PO TABS: 20.0000 mg | ORAL_TABLET | Freq: Two times a day (BID) | ORAL | 0 refills | Status: DC

## 2021-08-22 LAB — MICROSCOPIC EXAMINATION
Casts: NONE SEEN /lpf
Epithelial Cells (non renal): >10 /hpf — AB (ref 0–10)
RBC, Urine: NONE SEEN /hpf (ref 0–2)

## 2021-08-22 LAB — UA/M W/RFLX CULTURE, ROUTINE
Bilirubin, UA: NEGATIVE
Glucose, UA: NEGATIVE
Nitrite, UA: NEGATIVE
RBC, UA: NEGATIVE
Specific Gravity, UA: >=1.03 — AB (ref 1.005–1.030)
Urobilinogen, Ur: 1 mg/dL (ref 0.2–1.0)
pH, UA: 5 (ref 5.0–7.5)

## 2021-08-22 LAB — URINE CULTURE, REFLEX

## 2021-08-22 NOTE — Telephone Encounter (Signed)
Pt received meds  

## 2021-09-17 ENCOUNTER — Other Ambulatory Visit: Payer: Self-pay | Admitting: Physician Assistant

## 2021-09-17 ENCOUNTER — Telehealth: Payer: Self-pay

## 2021-09-17 DIAGNOSIS — F988 Other specified behavioral and emotional disorders with onset usually occurring in childhood and adolescence: Secondary | ICD-10-CM

## 2021-09-17 MED ORDER — AMPHETAMINE-DEXTROAMPHETAMINE 20 MG PO TABS: 20.0000 mg | ORAL_TABLET | Freq: Two times a day (BID) | ORAL | 0 refills | Status: DC

## 2021-09-17 NOTE — Telephone Encounter (Signed)
Pt informed that prescription for adderall was sent to [facility] in [place] ? ?

## 2021-10-04 ENCOUNTER — Other Ambulatory Visit: Payer: Self-pay

## 2021-10-04 DIAGNOSIS — H1010 Acute atopic conjunctivitis, unspecified eye: Secondary | ICD-10-CM

## 2021-10-04 MED ORDER — CETIRIZINE HCL 10 MG PO TABS: 10.0000 mg | ORAL_TABLET | Freq: Every day | ORAL | 3 refills | Status: DC

## 2021-10-10 ENCOUNTER — Encounter: Payer: Self-pay | Admitting: Physician Assistant

## 2021-10-14 ENCOUNTER — Encounter: Payer: Self-pay | Admitting: Obstetrics and Gynecology

## 2021-10-14 ENCOUNTER — Ambulatory Visit (INDEPENDENT_AMBULATORY_CARE_PROVIDER_SITE_OTHER): Payer: BC Managed Care – PPO | Admitting: Obstetrics and Gynecology

## 2021-10-14 VITALS — BP 126/84 | Ht 63.0 in | Wt 163.0 lb

## 2021-10-14 DIAGNOSIS — R875 Abnormal microbiological findings in specimens from female genital organs: Secondary | ICD-10-CM | POA: Diagnosis not present

## 2021-10-14 DIAGNOSIS — B9689 Other specified bacterial agents as the cause of diseases classified elsewhere: Secondary | ICD-10-CM

## 2021-10-14 DIAGNOSIS — N771 Vaginitis, vulvitis and vulvovaginitis in diseases classified elsewhere: Secondary | ICD-10-CM | POA: Diagnosis not present

## 2021-10-14 DIAGNOSIS — Z113 Encounter for screening for infections with a predominantly sexual mode of transmission: Secondary | ICD-10-CM | POA: Diagnosis not present

## 2021-10-14 DIAGNOSIS — N760 Acute vaginitis: Secondary | ICD-10-CM | POA: Diagnosis not present

## 2021-10-14 MED ORDER — METRONIDAZOLE 0.75 % VA GEL: 1.0000 | Freq: Every day | VAGINAL | 1 refills | Status: DC

## 2021-10-14 NOTE — Progress Notes (Signed)
? ?Patient ID: Paula Sullivan, female   DOB: Nov 27, 1985, 36 y.o.   MRN: [id number] ? ?Reason for Consult: Vaginitis ?  ?Referred by Lyndon Code, MD ? ?Subjective:  ?   ?HPI: ? ?Paula Sullivan is a 36 y.o. female. She presents today for evaluation of vaginal discharge.  She reports that she first noted vaginal irritation about a week ago.  She mostly has had vaginal itching.  Noticed odor.  She tried a one-time over-the-counter Diflucan without improvement of her symptoms.  She would also like to have complete STD testing today. ? ?Gynecological History ? ?Patient's last menstrual period was 10/07/2021 (exact date). ? ?Past Medical History:  ?Diagnosis Date  ? ADHD   ? Anemia   ? PONV (postoperative nausea and vomiting)   ? Type AB blood, Rh negative   ? ?Family History  ?Problem Relation Age of Onset  ? Suicidality Father   ?     2011  ? Diabetes Maternal Grandfather   ? Cancer Mother 49  ?     hyst--? cervical  ? ?Past Surgical History:  ?Procedure Laterality Date  ? CHOLECYSTECTOMY    ? GALLBLADDER SURGERY  01/2005  ? LAPAROSCOPIC BILATERAL SALPINGECTOMY Bilateral 06/13/2021  ? Procedure: LAPAROSCOPIC BILATERAL SALPINGECTOMY;  Surgeon: Nadara Mustard, MD;  Location: [facility] ORS;  Service: Gynecology;  Laterality: Bilateral;  ? SLEEVE GASTROPLASTY    ? ? ?Short Social History:  ?Social History  ? ?Tobacco Use  ? Smoking status: Never  ? Smokeless tobacco: Never  ?Substance Use Topics  ? Alcohol use: Not Currently  ?  Comment: Rarely  ? ? ?Allergies  ?Allergen Reactions  ? Latex Rash  ?  Unknown childhood reaction.   ? ? ?Current Outpatient Medications  ?Medication Sig Dispense Refill  ? amphetamine-dextroamphetamine (ADDERALL) 20 MG tablet Take 1 tablet (20 mg total) by mouth 2 (two) times daily. 60 tablet 0  ? azelastine (ASTELIN) 0.1 % nasal spray PLACE 1 SPRAY INTO BOTH NOSTRILS 2 (TWO) TIMES DAILY. USE IN EACH NOSTRIL AS DIRECTED 30 mL 1  ? cetirizine (ZYRTEC) 10 MG tablet Take 1 tablet (10 mg total) by mouth  daily. 90 tablet 3  ? escitalopram (LEXAPRO) 10 MG tablet Take 1 tablet (10 mg total) by mouth daily. 90 tablet 1  ? traZODone (DESYREL) 50 MG tablet Take 1 tablet (50 mg total) by mouth at bedtime. 90 tablet 1  ? azithromycin (ZITHROMAX) 250 MG tablet Take one tab a day for 10 days for uri (Patient not taking: Reported on 10/14/2021) 10 tablet 0  ? ?No current facility-administered medications for this visit.  ? ? ?Review of Systems  ?Constitutional: Negative for chills, fatigue, fever and unexpected weight change.  ?HENT: Negative for trouble swallowing.  ?Eyes: Negative for loss of vision.  ?Respiratory: Negative for cough, shortness of breath and wheezing.  ?Cardiovascular: Negative for chest pain, leg swelling, palpitations and syncope.  ?GI: Negative for abdominal pain, blood in stool, diarrhea, nausea and vomiting.  ?GU: Negative for difficulty urinating, dysuria, frequency and hematuria.  ?Musculoskeletal: Negative for back pain, leg pain and joint pain.  ?Skin: Negative for rash.  ?Neurological: Negative for dizziness, headaches, light-headedness, numbness and seizures.  ?Psychiatric: Negative for behavioral problem, confusion, depressed mood and sleep disturbance.   ? ?   ?Objective:  ?Objective  ? ?Vitals:  ? 10/14/21 0859  ?BP: 126/84  ?Weight: 163 lb (73.9 kg)  ?Height: 5\' 3"  (1.6 m)  ? ?Body mass index is 28.87 kg/m? ? ?  Physical Exam ?Vitals and nursing note reviewed. Exam conducted with a chaperone present.  ?Constitutional:   ?   Appearance: Normal appearance. She is well-developed.  ?HENT:  ?   Head: Normocephalic and atraumatic.  ?Eyes:  ?   Extraocular Movements: Extraocular movements intact.  ?   Pupils: Pupils are equal, round, and reactive to light.  ?Cardiovascular:  ?   Rate and Rhythm: Normal rate and regular rhythm.  ?Pulmonary:  ?   Effort: Pulmonary effort is normal. No respiratory distress.  ?   Breath sounds: Normal breath sounds.  ?Abdominal:  ?   General: Abdomen is flat.  ?    Palpations: Abdomen is soft.  ?Genitourinary: ?   General: Normal vulva.  ?   Comments: External: Normal appearing vulva. No lesions noted.  ?Speculum examination: Normal appearing cervix. No blood in the vaginal vault. White frothy thin discharge.   ?Musculoskeletal:     ?   General: No signs of injury.  ?Skin: ?   General: Skin is warm and dry.  ?Neurological:  ?   Mental Status: She is alert and oriented to person, place, and time.  ?Psychiatric:     ?   Behavior: Behavior normal.     ?   Thought Content: Thought content normal.     ?   Judgment: Judgment normal.  ? ?Wet Prep: ? ?Clue Cells: Positive, Whiff + ?Fungal elements: Negative ?Trichomonas: Negative ? ? ?Assessment/Plan:  ?  ? ?36 year old with acute vaginitis ?We will treat for presumptive bacterial vaginosis with MetroGel.  Patient reports that she did not tolerate oral Flagyl well in the past. ?Labs for HIV syphilis and hepatitis today. ?Nuswab collected and sent for confirmation. ? ?More than 20 minutes were spent face to face with the patient in the room, reviewing the medical record, labs and images, and coordinating care for the patient. The plan of management was discussed in detail and counseling was provided.  ? ? ?  ? ?Adelene Idler MD ?[facility], [facility] ?10/14/2021 ?9:12 AM ? ? ?

## 2021-10-14 NOTE — Patient Instructions (Signed)
Bacterial Vaginosis °Bacterial vaginosis is an infection that occurs when the normal balance of bacteria in the vagina changes. This change is caused by an overgrowth of certain bacteria in the vagina. Bacterial vaginosis is the most common vaginal infection among females aged 38 to 44 years. °This condition increases the risk of sexually transmitted infections (STIs). Treatment can help reduce this risk. Treatment is very important for pregnant women because this condition can cause babies to be born early (prematurely) or at a low birth weight. °What are the causes? °This condition is caused by an increase in harmful bacteria that are normally present in small amounts in the vagina. However, the exact reason this condition develops is not known. °You cannot get bacterial vaginosis from toilet seats, bedding, swimming pools, or contact with objects around you. °What increases the risk? °The following factors may make you more likely to develop this condition: °Having a new sexual partner or multiple sexual partners, or having unprotected sex. °Douching. °Having an intrauterine device (IUD). °Smoking. °Abusing drugs and alcohol. This may lead to riskier sexual behavior. °Taking certain antibiotic medicines. °Being pregnant. °What are the signs or symptoms? °Some women with this condition have no symptoms. Symptoms may include: °Gray or white vaginal discharge. The discharge can be watery or foamy. °A fish-like odor with discharge, especially after sex or during menstruation. °Itching in and around the vagina. °Burning or pain with urination. °How is this diagnosed? °This condition is diagnosed based on: °Your medical history. °A physical exam of the vagina. °Checking a sample of vaginal fluid for harmful bacteria or abnormal cells. °How is this treated? °This condition is treated with antibiotic medicines. These may be given as a pill, a vaginal cream, or a medicine that is put into the vagina (suppository). If the  condition comes back after treatment, a second round of antibiotics may be needed. °Follow these instructions at home: °Medicines °Take or apply over-the-counter and prescription medicines only as told by your health care provider. °Take or apply your antibiotic medicine as told by your health care provider. Do not stop using the antibiotic even if you start to feel better. °General instructions °If you have a female sexual partner, tell her that you have a vaginal infection. She should follow up with her health care provider. If you have a female sexual partner, he does not need treatment. °Avoid sexual activity until you finish treatment. °Drink enough fluid to keep your urine pale yellow. °Keep the area around your vagina and rectum clean. °Wash the area daily with warm water. °Wipe yourself from front to back after using the toilet. °If you are breastfeeding, talk to your health care provider about continuing breastfeeding during treatment. °Keep all follow-up visits. This is important. °How is this prevented? °Self-care °Do not douche. °Wash the outside of your vagina with warm water only. °Wear cotton or cotton-lined underwear. °Avoid wearing tight pants and pantyhose, especially during the summer. °Safe sex °Use protection when having sex. This includes: °Using condoms. °Using dental dams. This is a thin layer of a material made of latex or polyurethane that protects the mouth during oral sex. °Limit the number of sexual partners. To help prevent bacterial vaginosis, it is best to have sex with just one partner (monogamous relationship). °Make sure you and your sexual partner are tested for STIs. °Drugs and alcohol °Do not use any products that contain nicotine or tobacco. These products include cigarettes, chewing tobacco, and vaping devices, such as e-cigarettes. If you need help quitting,   ask your health care provider. °Do not use drugs. °Do not drink alcohol if: °Your health care provider tells you not to  do this. °You are pregnant, may be pregnant, or are planning to become pregnant. °If you drink alcohol: °Limit how much you have to 0-1 drink a day. °Be aware of how much alcohol is in your drink. In the [place] one drink equals one 12 oz bottle of beer (355 mL), one 5 oz glass of wine (148 mL), or one 1½ oz glass of hard liquor (44 mL). °Where to find more information °Centers for Disease Control and Prevention: www.cdc.gov °American Sexual Health Association (ASHA): www.ashastd.org °[place] Department of Health and Human Services, Office on Women's Health: www.womenshealth.gov °Contact a health care provider if: °Your symptoms do not improve, even after treatment. °You have more discharge or pain when urinating. °You have a fever or chills. °You have pain in your abdomen or pelvis. °You have pain during sex. °You have vaginal bleeding between menstrual periods. °Summary °Bacterial vaginosis is a vaginal infection that occurs when the normal balance of bacteria in the vagina changes. It results from an overgrowth of certain bacteria. °This condition increases the risk of sexually transmitted infections (STIs). Getting treated can help reduce this risk. °Treatment is very important for pregnant women because this condition can cause babies to be born early (prematurely) or at low birth weight. °This condition is treated with antibiotic medicines. These may be given as a pill, a vaginal cream, or a medicine that is put into the vagina (suppository). °This information is not intended to replace advice given to you by your health care provider. Make sure you discuss any questions you have with your health care provider. °Document Revised: 12/29/2019 Document Reviewed: 12/29/2019 °Elsevier Patient Education © 2022 [employer] Inc. ° °

## 2021-10-15 ENCOUNTER — Other Ambulatory Visit: Payer: Self-pay | Admitting: Physician Assistant

## 2021-10-15 LAB — HEP, RPR, HIV PANEL
HIV Screen 4th Generation wRfx: NONREACTIVE
Hepatitis B Surface Ag: NEGATIVE
RPR Ser Ql: NONREACTIVE

## 2021-10-15 MED ORDER — IRON (FERROUS SULFATE) 325 (65 FE) MG PO TABS: 325.0000 mg | ORAL_TABLET | Freq: Every day | ORAL | 2 refills | Status: DC

## 2021-10-17 LAB — NUSWAB VAGINITIS PLUS (VG+)
Atopobium vaginae: HIGH Score — AB
BVAB 2: HIGH Score — AB
Candida albicans, NAA: NEGATIVE
Candida glabrata, NAA: NEGATIVE
Chlamydia trachomatis, NAA: NEGATIVE
Megasphaera 1: HIGH Score — AB
Neisseria gonorrhoeae, NAA: NEGATIVE
Trich vag by NAA: NEGATIVE

## 2021-10-22 ENCOUNTER — Other Ambulatory Visit: Payer: Self-pay | Admitting: Physician Assistant

## 2021-10-22 DIAGNOSIS — F988 Other specified behavioral and emotional disorders with onset usually occurring in childhood and adolescence: Secondary | ICD-10-CM

## 2021-10-22 MED ORDER — AMPHETAMINE-DEXTROAMPHETAMINE 20 MG PO TABS: 20.0000 mg | ORAL_TABLET | Freq: Two times a day (BID) | ORAL | 0 refills | Status: DC

## 2021-10-28 ENCOUNTER — Encounter: Payer: Self-pay | Admitting: Physician Assistant

## 2021-10-28 DIAGNOSIS — M25561 Pain in right knee: Secondary | ICD-10-CM | POA: Insufficient documentation

## 2021-10-28 DIAGNOSIS — M13861 Other specified arthritis, right knee: Secondary | ICD-10-CM | POA: Diagnosis not present

## 2021-11-06 ENCOUNTER — Telehealth: Payer: Self-pay

## 2021-11-06 ENCOUNTER — Encounter: Payer: Self-pay | Admitting: Physician Assistant

## 2021-11-06 ENCOUNTER — Other Ambulatory Visit: Payer: Self-pay | Admitting: Physician Assistant

## 2021-11-06 DIAGNOSIS — Z0182 Encounter for allergy testing: Secondary | ICD-10-CM

## 2021-11-06 DIAGNOSIS — T63481A Toxic effect of venom of other arthropod, accidental (unintentional), initial encounter: Secondary | ICD-10-CM

## 2021-11-06 NOTE — Telephone Encounter (Signed)
Manually faxed allergy referral to [facility] per patient request-[name] ?

## 2021-11-11 ENCOUNTER — Encounter: Payer: Self-pay | Admitting: Nurse Practitioner

## 2021-11-11 ENCOUNTER — Ambulatory Visit: Payer: BC Managed Care – PPO | Admitting: Internal Medicine

## 2021-11-11 VITALS — BP 110/78 | HR 73 | Temp 98.8°F | Resp 16 | Ht 63.0 in | Wt 162.4 lb

## 2021-11-11 DIAGNOSIS — N760 Acute vaginitis: Secondary | ICD-10-CM

## 2021-11-11 DIAGNOSIS — M545 Low back pain, unspecified: Secondary | ICD-10-CM | POA: Diagnosis not present

## 2021-11-11 DIAGNOSIS — G8929 Other chronic pain: Secondary | ICD-10-CM

## 2021-11-11 DIAGNOSIS — R300 Dysuria: Secondary | ICD-10-CM

## 2021-11-11 DIAGNOSIS — B9689 Other specified bacterial agents as the cause of diseases classified elsewhere: Secondary | ICD-10-CM | POA: Diagnosis not present

## 2021-11-11 DIAGNOSIS — Z903 Acquired absence of stomach [part of]: Secondary | ICD-10-CM

## 2021-11-11 LAB — POCT URINALYSIS DIPSTICK
Blood, UA: NEGATIVE
Glucose, UA: NEGATIVE
Leukocytes, UA: NEGATIVE
Nitrite, UA: NEGATIVE
Protein, UA: NEGATIVE
Spec Grav, UA: 1.025 (ref 1.010–1.025)
Urobilinogen, UA: 0.2 E.U./dL
pH, UA: 5 (ref 5.0–8.0)

## 2021-11-11 MED ORDER — BORIC ACID VAGINAL 600 MG VA SUPP: VAGINAL | 1 refills | Status: DC

## 2021-11-11 MED ORDER — CYCLOBENZAPRINE HCL 5 MG PO TABS: ORAL_TABLET | ORAL | 0 refills | Status: DC

## 2021-11-11 NOTE — Progress Notes (Signed)
[facility] ?[address] ?[place], [place] [address] ? ?Internal MEDICINE  ?Office Visit Note ? ?Patient Name: Paula Sullivan ? [id number]  ?[id number] ? ?Date of Service: 11/19/2021 ? ?Chief Complaint  ?Patient presents with  ? Acute Visit  ?  Possible kidney infection, lower back / side pain, started about 2 days ago, pelvic pressure   ? Nausea  ? ? ?HPI ? ?Patient is here for acute and sick visit. ?She has been having lower back pain side pain for last 2 days patient also works out to has lost over 100 pounds after getting sleeve gastroplasty here in the office she had visits with the same complaint in the urine has been negative for any bacterial growth on previous examination patient was found to have heavy growth of BV ? ? ?Current Medication: ?Outpatient Encounter Medications as of 11/11/2021  ?Medication Sig  ? azelastine (ASTELIN) 0.1 % nasal spray PLACE 1 SPRAY INTO BOTH NOSTRILS 2 (TWO) TIMES DAILY. USE IN EACH NOSTRIL AS DIRECTED  ? Boric Acid Vaginal 600 MG SUPP 3 days Use once day before and after menstural cycle  ? cetirizine (ZYRTEC) 10 MG tablet Take 1 tablet (10 mg total) by mouth daily.  ? cyclobenzaprine (FLEXERIL) 5 MG tablet Take one tab po qhs for back spasm prn only  ? escitalopram (LEXAPRO) 10 MG tablet Take 1 tablet (10 mg total) by mouth daily.  ? Iron, Ferrous Sulfate, 325 (65 Fe) MG TABS Take 325 mg by mouth daily.  ? traZODone (DESYREL) 50 MG tablet Take 1 tablet (50 mg total) by mouth at bedtime.  ? [DISCONTINUED] amphetamine-dextroamphetamine (ADDERALL) 20 MG tablet Take 1 tablet (20 mg total) by mouth 2 (two) times daily.  ? [DISCONTINUED] metroNIDAZOLE (METROGEL) 0.75 % vaginal gel Place 1 Applicatorful vaginally at bedtime. Apply one applicatorful to vagina at bedtime for 5 days  ? [DISCONTINUED] azithromycin (ZITHROMAX) 250 MG tablet Take one tab a day for 10 days for uri (Patient not taking: Reported on 10/14/2021)  ? [DISCONTINUED] norgestimate-ethinyl estradiol  (ORTHO-CYCLEN) 0.25-35 MG-MCG tablet Take 1 tablet by mouth daily.  ? ?No facility-administered encounter medications on file as of 11/11/2021.  ? ? ?Surgical History: ?Past Surgical History:  ?Procedure Laterality Date  ? CHOLECYSTECTOMY    ? GALLBLADDER SURGERY  01/2005  ? LAPAROSCOPIC BILATERAL SALPINGECTOMY Bilateral 06/13/2021  ? Procedure: LAPAROSCOPIC BILATERAL SALPINGECTOMY;  Surgeon: Nadara Mustard, MD;  Location: [facility] ORS;  Service: Gynecology;  Laterality: Bilateral;  ? SLEEVE GASTROPLASTY    ? ? ?Medical History: ?Past Medical History:  ?Diagnosis Date  ? ADHD   ? Anemia   ? PONV (postoperative nausea and vomiting)   ? Type AB blood, Rh negative   ? ? ?Family History: ?Family History  ?Problem Relation Age of Onset  ? Suicidality Father   ?     2011  ? Diabetes Maternal Grandfather   ? Cancer Mother 17  ?     hyst--? cervical  ? ? ?Social History  ? ?Socioeconomic History  ? Marital status: Single  ?  Spouse name: Not on file  ? Number of children: 1  ? Years of education: Not on file  ? Highest education level: Not on file  ?Occupational History  ? Not on file  ?Tobacco Use  ? Smoking status: Never  ? Smokeless tobacco: Never  ?Vaping Use  ? Vaping Use: Never used  ?Substance and Sexual Activity  ? Alcohol use: Not Currently  ?  Comment: Rarely  ?  Drug use: No  ? Sexual activity: Yes  ?  Birth control/protection: None  ?Other Topics Concern  ? Not on file  ?Social History Narrative  ? Not on file  ? ?Social Determinants of Health  ? ?Financial Resource Strain: Not on file  ?Food Insecurity: Not on file  ?Transportation Needs: Not on file  ?Physical Activity: Not on file  ?Stress: Not on file  ?Social Connections: Not on file  ?Intimate Partner Violence: Not on file  ? ? ? ? ?Review of Systems  ?Constitutional:  Negative for fatigue and fever.  ?HENT:  Negative for congestion, mouth sores and postnasal drip.   ?Respiratory:  Negative for cough.   ?Cardiovascular:  Negative for chest pain.  ?Genitourinary:   Positive for flank pain and vaginal discharge.  ?Musculoskeletal:  Positive for back pain.  ?Psychiatric/Behavioral: Negative.    ? ?Vital Signs: ?BP 110/78   Pulse 73   Temp 98.8 ?F (37.1 ?C)   Resp 16   Ht  (1.6 m)   Wt 162 lb 6.4 oz (73.7 kg)   SpO2 98%   BMI 28.77 kg/m?  ? ? ?Physical Exam ?Constitutional:   ?   Appearance: Normal appearance.  ?HENT:  ?   Head: Normocephalic and atraumatic.  ?   Nose: Nose normal.  ?   Mouth/Throat:  ?   Mouth: Mucous membranes are moist.  ?   Pharynx: No posterior oropharyngeal erythema.  ?Eyes:  ?   Extraocular Movements: Extraocular movements intact.  ?   Pupils: Pupils are equal, round, and reactive to light.  ?Cardiovascular:  ?   Pulses: Normal pulses.  ?   Heart sounds: Normal heart sounds.  ?Pulmonary:  ?   Effort: Pulmonary effort is normal.  ?   Breath sounds: Normal breath sounds.  ?Musculoskeletal:  ?   Comments: Patient does have some tenderness in her paraspinal muscles in the lower back  ?Neurological:  ?   General: No focal deficit present.  ?   Mental Status: She is alert.  ?Psychiatric:     ?   Mood and Affect: Mood normal.     ?   Behavior: Behavior normal.  ? ? ? ? ? ?Assessment/Plan: ?1. Bacterial vaginosis ?Patient has been treated in the past by her OB/GYN will give suppressive therapy with boric acid and see if that helps weeks and also urine for cultures and sensitivities ?- POCT Urinalysis Dipstick ?- Boric Acid Vaginal 600 MG SUPP; 3 days Use once day before and after menstural cycle  Dispense: 30 suppository; Refill: 1 ?- CULTURE, URINE COMPREHENSIVE ? ?2. Chronic bilateral low back pain without sciatica ?Patient is instructed to do stretching after exercise ?- cyclobenzaprine (FLEXERIL) 5 MG tablet; Take one tab po qhs for back spasm prn only  Dispense: 25 tablet; Refill: 0 ? ?3. S/P gastric sleeve procedure ?Patient is also instructed to have her Electrolytes since patient has lost over 100 pounds ? ?General Counseling: Paighton verbalizes  understanding of the findings of todays visit and agrees with plan of treatment. I have discussed any further diagnostic evaluation that may be needed or ordered today. We also reviewed her medications today. she has been encouraged to call the office with any questions or concerns that should arise related to todays visit. ? ? ? ?Orders Placed This Encounter  ?Procedures  ? CULTURE, URINE COMPREHENSIVE  ? POCT Urinalysis Dipstick  ? ? ?Meds ordered this encounter  ?Medications  ? Boric Acid Vaginal 600 MG SUPP  ?  Sig: 3 days Use once day before and after menstural cycle  ?  Dispense:  30 suppository  ?  Refill:  1  ? cyclobenzaprine (FLEXERIL) 5 MG tablet  ?  Sig: Take one tab po qhs for back spasm prn only  ?  Dispense:  25 tablet  ?  Refill:  0  ? ? ?Total time spent:35 Minutes ?Time spent includes review of chart, medications, test results, and follow up plan with the patient.  ? ? Controlled Substance Database was reviewed by me. ? ? ?Dr Lyndon Code ?Internal medicine  ?

## 2021-11-15 LAB — CULTURE, URINE COMPREHENSIVE

## 2021-11-18 ENCOUNTER — Encounter: Payer: Self-pay | Admitting: Physician Assistant

## 2021-11-18 ENCOUNTER — Encounter: Payer: Self-pay | Admitting: Obstetrics and Gynecology

## 2021-11-18 ENCOUNTER — Ambulatory Visit: Payer: BC Managed Care – PPO | Admitting: Physician Assistant

## 2021-11-18 DIAGNOSIS — F411 Generalized anxiety disorder: Secondary | ICD-10-CM | POA: Diagnosis not present

## 2021-11-18 DIAGNOSIS — F988 Other specified behavioral and emotional disorders with onset usually occurring in childhood and adolescence: Secondary | ICD-10-CM | POA: Diagnosis not present

## 2021-11-18 DIAGNOSIS — F5101 Primary insomnia: Secondary | ICD-10-CM | POA: Diagnosis not present

## 2021-11-18 DIAGNOSIS — Z79899 Other long term (current) drug therapy: Secondary | ICD-10-CM

## 2021-11-18 LAB — POCT URINE DRUG SCREEN
Methylenedioxyamphetamine: NOT DETECTED
POC Amphetamine UR: POSITIVE — AB
POC BENZODIAZEPINES UR: NOT DETECTED
POC Barbiturate UR: NOT DETECTED
POC Cocaine UR: NOT DETECTED
POC Ecstasy UR: NOT DETECTED
POC Marijuana UR: NOT DETECTED
POC Methadone UR: NOT DETECTED
POC Methamphetamine UR: NOT DETECTED
POC Opiate Ur: NOT DETECTED
POC Oxycodone UR: NOT DETECTED
POC PHENCYCLIDINE UR: NOT DETECTED
POC TRICYCLICS UR: POSITIVE — AB

## 2021-11-18 MED ORDER — AMPHETAMINE-DEXTROAMPHETAMINE 20 MG PO TABS: 20.0000 mg | ORAL_TABLET | Freq: Two times a day (BID) | ORAL | 0 refills | Status: DC

## 2021-11-18 NOTE — Progress Notes (Signed)
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 She reports her son is also allergic and is going to be seeing them as well. She is going to reach back out since she was told they would be in touch soon and has been 2 weeks. - Doing well on her adderall BID. She has an alarm set to make sure she takes her second dose before it gets too late as this does impact her sleep some. Ws previously tried adjusting dose to minimize evening impact, but ultimately did better on the her current dose. -Taking trazodone 50mg  nightly and was working well, but not as much now. Will try adding melatonin. May increase trazodone if needed if melatonin doesn't help  Current Medication: Outpatient Encounter Medications as of 11/18/2021  Medication Sig   [START ON 01/18/2022] amphetamine-dextroamphetamine (ADDERALL) 20 MG tablet Take 1 tablet (20 mg total) by mouth 2 (two) times daily.   [START ON 12/20/2021] amphetamine-dextroamphetamine (ADDERALL) 20 MG tablet Take 1 tablet (20 mg total) by mouth 2 (two) times daily.   azelastine (ASTELIN) 0.1 % nasal spray PLACE 1 SPRAY INTO BOTH NOSTRILS 2 (TWO) TIMES DAILY. USE IN EACH NOSTRIL AS DIRECTED   Boric Acid Vaginal 600 MG SUPP 3 days Use once day before and after menstural cycle   cetirizine (ZYRTEC) 10 MG tablet Take 1 tablet (10 mg total) by mouth daily.   cyclobenzaprine (FLEXERIL) 5 MG tablet Take one tab po qhs for back spasm prn only   escitalopram (LEXAPRO) 10 MG tablet Take 1 tablet (10 mg total) by mouth daily.   Iron, Ferrous Sulfate, 325 (65 Fe) MG TABS Take 325 mg by mouth daily.   traZODone (DESYREL) 50 MG tablet  Take 1 tablet (50 mg total) by mouth at bedtime.   [DISCONTINUED] amphetamine-dextroamphetamine (ADDERALL) 20 MG tablet Take 1 tablet (20 mg total) by mouth 2 (two) times daily.   [DISCONTINUED] metroNIDAZOLE (METROGEL) 0.75 % vaginal gel Place 1 Applicatorful vaginally at bedtime. Apply one applicatorful to vagina at bedtime for 5 days   amphetamine-dextroamphetamine (ADDERALL) 20 MG tablet Take 1 tablet (20 mg total) by mouth 2 (two) times daily.   [DISCONTINUED] norgestimate-ethinyl estradiol (ORTHO-CYCLEN) 0.25-35 MG-MCG tablet Take 1 tablet by mouth daily.   No facility-administered encounter medications on file as of 11/18/2021.    Surgical History: Past Surgical History:  Procedure Laterality Date   CHOLECYSTECTOMY     GALLBLADDER SURGERY  01/2005   LAPAROSCOPIC BILATERAL SALPINGECTOMY Bilateral 06/13/2021   Procedure: LAPAROSCOPIC BILATERAL SALPINGECTOMY;  Surgeon: Gae Dry, MD;  Location: [facility] ORS;  Service: Gynecology;  Laterality: Bilateral;   SLEEVE GASTROPLASTY      Medical History: Past Medical History:  Diagnosis Date   ADHD    Anemia    PONV (postoperative nausea and vomiting)    Type AB blood, Rh negative     Family History: Family History  Problem Relation Age of Onset   Suicidality Father        2011   Diabetes Maternal Grandfather    Cancer Mother 30       hyst--? cervical  Social History   Socioeconomic History   Marital status: Single    Spouse name: Not on file   Number of children: 1   Years of education: Not on file   Highest education level: Not on file  Occupational History   Not on file  Tobacco Use   Smoking status: Never   Smokeless tobacco: Never  Vaping Use   Vaping Use: Never used  Substance and Sexual Activity   Alcohol use: Not Currently    Comment: Rarely   Drug use: No   Sexual activity: Yes    Birth control/protection: None  Other Topics Concern   Not on file  Social History Narrative   Not on file   Social  Determinants of Health   Financial Resource Strain: Not on file  Food Insecurity: Not on file  Transportation Needs: Not on file  Physical Activity: Not on file  Stress: Not on file  Social Connections: Not on file  Intimate Partner Violence: Not on file      Review of Systems  Constitutional:  Negative for chills, fatigue and unexpected weight change.  HENT:  Negative for congestion, postnasal drip, rhinorrhea, sneezing and sore throat.   Eyes:  Negative for redness.  Respiratory:  Negative for cough, chest tightness and shortness of breath.   Cardiovascular:  Negative for chest pain and palpitations.  Gastrointestinal:  Negative for abdominal pain, constipation, diarrhea, nausea and vomiting.  Genitourinary:  Negative for dysuria and frequency.  Musculoskeletal:  Negative for arthralgias, back pain, joint swelling and neck pain.  Skin:  Negative for rash.  Neurological: Negative.  Negative for tremors and numbness.  Hematological:  Negative for adenopathy. Does not bruise/bleed easily.  Psychiatric/Behavioral:  Positive for sleep disturbance. Negative for behavioral problems (Depression) and suicidal ideas. The patient is not nervous/anxious.    Vital Signs: BP 116/80   Pulse 91   Temp 98 F (36.7 C)   Resp 16   Ht 5\' 3"  (1.6 m)   Wt 162 lb (73.5 kg)   SpO2 97%   BMI 28.70 kg/m    Physical Exam Vitals and nursing note reviewed.  Constitutional:      General: She is not in acute distress.    Appearance: She is well-developed. She is obese. She is not diaphoretic.  HENT:     Head: Normocephalic and atraumatic.     Mouth/Throat:     Pharynx: No oropharyngeal exudate.  Eyes:     Pupils: Pupils are equal, round, and reactive to light.  Neck:     Thyroid: No thyromegaly.     Vascular: No JVD.     Trachea: No tracheal deviation.  Cardiovascular:     Rate and Rhythm: Normal rate and regular rhythm.     Heart sounds: Normal heart sounds. No murmur heard.   No  friction rub. No gallop.  Pulmonary:     Effort: Pulmonary effort is normal. No respiratory distress.     Breath sounds: No wheezing or rales.  Chest:     Chest wall: No tenderness.  Abdominal:     General: Bowel sounds are normal.     Palpations: Abdomen is soft.  Musculoskeletal:        General: Normal range of motion.     Cervical back: Normal range of motion and neck supple.  Lymphadenopathy:     Cervical: No cervical adenopathy.  Skin:    General: Skin is warm and dry.  Neurological:     Mental Status: She  is alert and oriented to person, place, and time.     Cranial Nerves: No cranial nerve deficit.  Psychiatric:        Behavior: Behavior normal.        Thought Content: Thought content normal.        Judgment: Judgment normal.       Assessment/Plan: 1. Attention deficit disorder (ADD) in adult May continue adderall as needed - amphetamine-dextroamphetamine (ADDERALL) 20 MG tablet; Take 1 tablet (20 mg total) by mouth 2 (two) times daily.  Dispense: 60 tablet; Refill: 0 - amphetamine-dextroamphetamine (ADDERALL) 20 MG tablet; Take 1 tablet (20 mg total) by mouth 2 (two) times daily.  Dispense: 60 tablet; Refill: 0 - amphetamine-dextroamphetamine (ADDERALL) 20 MG tablet; Take 1 tablet (20 mg total) by mouth 2 (two) times daily.  Dispense: 60 tablet; Refill: 0 [place] Controlled Substance Database was reviewed by me for overdose risk score (ORS) Refilled Controlled medications today. Reviewed risks and possible side effects associated with taking Stimulants. Combination of these drugs with other psychotropic medications could cause dizziness and drowsiness. Pt needs to Monitor symptoms and exercise caution in driving and operating heavy machinery to avoid damages to oneself, to others and to the surroundings. Patient verbalized understanding in this matter. Dependence and abuse for these drugs will be monitored closely. A Controlled substance policy and procedure is on file which  allows [facility] to order a urine drug screen test at any visit. Patient understands and agrees with the plan..  2. Primary insomnia Continue trazodone and may add melatonin. If not improving may increase trazodone to 75-100mg .  3. GAD (generalized anxiety disorder) Stable, continue lexapro  4. Encounter for long-term (current) use of medications - POCT Urine Drug Screen\   General Counseling: Tavon verbalizes understanding of the findings of todays visit and agrees with plan of treatment. I have discussed any further diagnostic evaluation that may be needed or ordered today. We also reviewed her medications today. she has been encouraged to call the office with any questions or concerns that should arise related to todays visit.    Orders Placed This Encounter  Procedures   POCT Urine Drug Screen    Meds ordered this encounter  Medications   amphetamine-dextroamphetamine (ADDERALL) 20 MG tablet    Sig: Take 1 tablet (20 mg total) by mouth 2 (two) times daily.    Dispense:  60 tablet    Refill:  0   amphetamine-dextroamphetamine (ADDERALL) 20 MG tablet    Sig: Take 1 tablet (20 mg total) by mouth 2 (two) times daily.    Dispense:  60 tablet    Refill:  0   amphetamine-dextroamphetamine (ADDERALL) 20 MG tablet    Sig: Take 1 tablet (20 mg total) by mouth 2 (two) times daily.    Dispense:  60 tablet    Refill:  0    This patient was seen by Drema Dallas, PA-C in collaboration with Dr. Clayborn Bigness as a part of collaborative care agreement.   Total time spent:30 Minutes Time spent includes review of chart, medications, test results, and follow up plan with the patient.      Dr Lavera Guise Internal medicine

## 2021-11-19 ENCOUNTER — Other Ambulatory Visit: Payer: Self-pay | Admitting: Obstetrics and Gynecology

## 2021-11-19 DIAGNOSIS — B9689 Other specified bacterial agents as the cause of diseases classified elsewhere: Secondary | ICD-10-CM

## 2021-11-19 MED ORDER — METRONIDAZOLE 0.75 % VA GEL: VAGINAL | 1 refills | Status: DC

## 2021-11-22 ENCOUNTER — Telehealth: Payer: Self-pay

## 2021-11-22 NOTE — Telephone Encounter (Signed)
Called [facility] to see if patient has been scheduled. They stated they did not receive referral. I faxed again to [phone] ?

## 2021-12-12 ENCOUNTER — Encounter: Payer: Self-pay | Admitting: Physician Assistant

## 2021-12-12 ENCOUNTER — Other Ambulatory Visit: Payer: Self-pay | Admitting: Physician Assistant

## 2021-12-12 MED ORDER — AZITHROMYCIN 250 MG PO TABS: ORAL_TABLET | ORAL | 0 refills | Status: AC

## 2021-12-12 NOTE — Telephone Encounter (Signed)
Called back to [employer]. Per Jeani Hawking, she has received referral. Will try to reach out to patient today. I notified patient via mychart message-[name]

## 2021-12-18 ENCOUNTER — Encounter: Payer: Self-pay | Admitting: Physician Assistant

## 2021-12-19 NOTE — Telephone Encounter (Signed)
01/31/22 appointment @ [facility]-[name]

## 2021-12-19 NOTE — Telephone Encounter (Signed)
Patient still has not heard from allergy clinic. Per her request, referral sent via [employer] to [facility] in [facility]

## 2021-12-31 ENCOUNTER — Ambulatory Visit: Payer: BC Managed Care – PPO | Admitting: Gastroenterology

## 2022-02-17 ENCOUNTER — Ambulatory Visit: Payer: Medicaid Other | Admitting: Physician Assistant

## 2022-02-27 ENCOUNTER — Ambulatory Visit: Payer: Medicaid Other | Admitting: Physician Assistant

## 2022-02-27 ENCOUNTER — Encounter: Payer: Self-pay | Admitting: Physician Assistant

## 2022-02-27 DIAGNOSIS — F411 Generalized anxiety disorder: Secondary | ICD-10-CM

## 2022-02-27 DIAGNOSIS — F5101 Primary insomnia: Secondary | ICD-10-CM | POA: Diagnosis not present

## 2022-02-27 DIAGNOSIS — H1010 Acute atopic conjunctivitis, unspecified eye: Secondary | ICD-10-CM

## 2022-02-27 DIAGNOSIS — F988 Other specified behavioral and emotional disorders with onset usually occurring in childhood and adolescence: Secondary | ICD-10-CM

## 2022-02-27 DIAGNOSIS — J309 Allergic rhinitis, unspecified: Secondary | ICD-10-CM | POA: Diagnosis not present

## 2022-02-27 MED ORDER — AMPHETAMINE-DEXTROAMPHETAMINE 20 MG PO TABS: 20.0000 mg | ORAL_TABLET | Freq: Two times a day (BID) | ORAL | 0 refills | Status: DC

## 2022-02-27 MED ORDER — TRAZODONE HCL 50 MG PO TABS: 50.0000 mg | ORAL_TABLET | Freq: Every day | ORAL | 1 refills | Status: DC

## 2022-02-27 MED ORDER — CETIRIZINE HCL 10 MG PO TABS: 10.0000 mg | ORAL_TABLET | Freq: Every day | ORAL | 3 refills | Status: DC

## 2022-02-27 NOTE — Progress Notes (Signed)
[facility] [address] [place], [place] [address]  Internal MEDICINE  Office Visit Note  Patient Name: Paula Sullivan  [id number]  [id number]  Date of Service: 03/04/2022  Chief Complaint  Patient presents with   Follow-up   ADHD   Sleeping Problem    Barely sleeping    HPI Pt is here for routine follow up for med refills -Weaned off lexapro during summer and has been good without it. May re-evaluate once regular work hours has started back. -Sleep has been worse, will go to sleep for 2 hours but then be up for awhile and cant get back to sleep. Does ruminate. Discussed active thinking techniques to help with this and will continue trazodone as before. Will also avoid taking adderall in afternoon if able. Thinks getting back into normal routine with work will help get her sleep back on track. May need to consider resuming Lexapro if anxiety is worsening to as this may be impacting sleep since stopping.  Current Medication: Outpatient Encounter Medications as of 02/27/2022  Medication Sig   azelastine (ASTELIN) 0.1 % nasal spray PLACE 1 SPRAY INTO BOTH NOSTRILS 2 (TWO) TIMES DAILY. USE IN EACH NOSTRIL AS DIRECTED   Boric Acid Vaginal 600 MG SUPP 3 days Use once day before and after menstural cycle   cyclobenzaprine (FLEXERIL) 5 MG tablet Take one tab po qhs for back spasm prn only   Iron, Ferrous Sulfate, 325 (65 Fe) MG TABS Take 325 mg by mouth daily.   metroNIDAZOLE (METROGEL) 0.75 % vaginal gel Apply one applicatorful to vagina at bedtime for 5 days   [DISCONTINUED] amphetamine-dextroamphetamine (ADDERALL) 20 MG tablet Take 1 tablet (20 mg total) by mouth 2 (two) times daily.   [DISCONTINUED] amphetamine-dextroamphetamine (ADDERALL) 20 MG tablet Take 1 tablet (20 mg total) by mouth 2 (two) times daily.   [DISCONTINUED] amphetamine-dextroamphetamine (ADDERALL) 20 MG tablet Take 1 tablet (20 mg total) by mouth 2 (two) times daily.   [DISCONTINUED] cetirizine (ZYRTEC) 10  MG tablet Take 1 tablet (10 mg total) by mouth daily.   [DISCONTINUED] escitalopram (LEXAPRO) 10 MG tablet Take 1 tablet (10 mg total) by mouth daily.   [DISCONTINUED] traZODone (DESYREL) 50 MG tablet Take 1 tablet (50 mg total) by mouth at bedtime.   [START ON 04/27/2022] amphetamine-dextroamphetamine (ADDERALL) 20 MG tablet Take 1 tablet (20 mg total) by mouth 2 (two) times daily.   [START ON 03/29/2022] amphetamine-dextroamphetamine (ADDERALL) 20 MG tablet Take 1 tablet (20 mg total) by mouth 2 (two) times daily.   amphetamine-dextroamphetamine (ADDERALL) 20 MG tablet Take 1 tablet (20 mg total) by mouth 2 (two) times daily.   cetirizine (ZYRTEC) 10 MG tablet Take 1 tablet (10 mg total) by mouth daily.   traZODone (DESYREL) 50 MG tablet Take 1 tablet (50 mg total) by mouth at bedtime.   [DISCONTINUED] norgestimate-ethinyl estradiol (ORTHO-CYCLEN) 0.25-35 MG-MCG tablet Take 1 tablet by mouth daily.   No facility-administered encounter medications on file as of 02/27/2022.    Surgical History: Past Surgical History:  Procedure Laterality Date   CHOLECYSTECTOMY     GALLBLADDER SURGERY  01/2005   LAPAROSCOPIC BILATERAL SALPINGECTOMY Bilateral 06/13/2021   Procedure: LAPAROSCOPIC BILATERAL SALPINGECTOMY;  Surgeon: Nadara Mustard, MD;  Location: [facility] ORS;  Service: Gynecology;  Laterality: Bilateral;   SLEEVE GASTROPLASTY      Medical History: Past Medical History:  Diagnosis Date   ADHD    Anemia    PONV (postoperative nausea and vomiting)    Type AB  blood, Rh negative     Family History: Family History  Problem Relation Age of Onset   Suicidality Father        2011   Diabetes Maternal Grandfather    Cancer Mother 20       hyst--? cervical    Social History   Socioeconomic History   Marital status: Single    Spouse name: Not on file   Number of children: 1   Years of education: Not on file   Highest education level: Not on file  Occupational History   Not on file   Tobacco Use   Smoking status: Never   Smokeless tobacco: Never  Vaping Use   Vaping Use: Never used  Substance and Sexual Activity   Alcohol use: Not Currently    Comment: Rarely   Drug use: No   Sexual activity: Yes    Birth control/protection: None  Other Topics Concern   Not on file  Social History Narrative   Not on file   Social Determinants of Health   Financial Resource Strain: Not on file  Food Insecurity: Not on file  Transportation Needs: Not on file  Physical Activity: Not on file  Stress: Not on file  Social Connections: Not on file  Intimate Partner Violence: Not on file      Review of Systems  Constitutional:  Negative for chills, fatigue and unexpected weight change.  HENT:  Negative for congestion, postnasal drip, rhinorrhea, sneezing and sore throat.   Eyes:  Negative for redness.  Respiratory:  Negative for cough, chest tightness and shortness of breath.   Cardiovascular:  Negative for chest pain and palpitations.  Gastrointestinal:  Negative for abdominal pain, constipation, diarrhea, nausea and vomiting.  Genitourinary:  Negative for dysuria and frequency.  Musculoskeletal:  Negative for arthralgias, back pain, joint swelling and neck pain.  Skin:  Negative for rash.  Neurological: Negative.  Negative for tremors and numbness.  Hematological:  Negative for adenopathy. Does not bruise/bleed easily.  Psychiatric/Behavioral:  Positive for sleep disturbance. Negative for behavioral problems (Depression) and suicidal ideas. The patient is not nervous/anxious.     Vital Signs: BP 115/69   Pulse 97   Temp 98.3 F (36.8 C)   Resp 16   Ht 5\' 3"  (1.6 m)   Wt 162 lb 3.2 oz (73.6 kg)   SpO2 99%   BMI 28.73 kg/m    Physical Exam Vitals and nursing note reviewed.  Constitutional:      General: She is not in acute distress.    Appearance: She is well-developed. She is obese. She is not diaphoretic.  HENT:     Head: Normocephalic and atraumatic.      Mouth/Throat:     Pharynx: No oropharyngeal exudate.  Eyes:     Pupils: Pupils are equal, round, and reactive to light.  Neck:     Thyroid: No thyromegaly.     Vascular: No JVD.     Trachea: No tracheal deviation.  Cardiovascular:     Rate and Rhythm: Normal rate and regular rhythm.     Heart sounds: Normal heart sounds. No murmur heard.    No friction rub. No gallop.  Pulmonary:     Effort: Pulmonary effort is normal. No respiratory distress.     Breath sounds: No wheezing or rales.  Chest:     Chest wall: No tenderness.  Abdominal:     General: Bowel sounds are normal.     Palpations: Abdomen is soft.  Musculoskeletal:  General: Normal range of motion.     Cervical back: Normal range of motion and neck supple.  Lymphadenopathy:     Cervical: No cervical adenopathy.  Skin:    General: Skin is warm and dry.  Neurological:     Mental Status: She is alert and oriented to person, place, and time.     Cranial Nerves: No cranial nerve deficit.  Psychiatric:        Behavior: Behavior normal.        Thought Content: Thought content normal.        Judgment: Judgment normal.        Assessment/Plan: 1. Attention deficit disorder (ADD) in adult Refills sent for adderall to be taken as needed. Advised to avoid taking too late during afternoon. - amphetamine-dextroamphetamine (ADDERALL) 20 MG tablet; Take 1 tablet (20 mg total) by mouth 2 (two) times daily.  Dispense: 60 tablet; Refill: 0 - amphetamine-dextroamphetamine (ADDERALL) 20 MG tablet; Take 1 tablet (20 mg total) by mouth 2 (two) times daily.  Dispense: 60 tablet; Refill: 0 - amphetamine-dextroamphetamine (ADDERALL) 20 MG tablet; Take 1 tablet (20 mg total) by mouth 2 (two) times daily.  Dispense: 60 tablet; Refill: 0 [place] Controlled Substance Database was reviewed by me for overdose risk score (ORS) Refilled Controlled medications today. Reviewed risks and possible side effects associated with taking Stimulants.  Combination of these drugs with other psychotropic medications could cause dizziness and drowsiness. Pt needs to Monitor symptoms and exercise caution in driving and operating heavy machinery to avoid damages to oneself, to others and to the surroundings. Patient verbalized understanding in this matter. Dependence and abuse for these drugs will be monitored closely. A Controlled substance policy and procedure is on file which allows [facility] to order a urine drug screen test at any visit. Patient understands and agrees with the plan.  2. Primary insomnia Discussed active thinking techniques and getting back into routine with end of summer approaching. May continue trazodone as before - traZODone (DESYREL) 50 MG tablet; Take 1 tablet (50 mg total) by mouth at bedtime.  Dispense: 90 tablet; Refill: 1  3. GAD (generalized anxiety disorder) Doing well off Lexapro, except for sleep worsening. May need to consider restarting.  4. Allergic rhinoconjunctivitis - cetirizine (ZYRTEC) 10 MG tablet; Take 1 tablet (10 mg total) by mouth daily.  Dispense: 90 tablet; Refill: 3   General Counseling: Mela verbalizes understanding of the findings of todays visit and agrees with plan of treatment. I have discussed any further diagnostic evaluation that may be needed or ordered today. We also reviewed her medications today. she has been encouraged to call the office with any questions or concerns that should arise related to todays visit.    No orders of the defined types were placed in this encounter.   Meds ordered this encounter  Medications   amphetamine-dextroamphetamine (ADDERALL) 20 MG tablet    Sig: Take 1 tablet (20 mg total) by mouth 2 (two) times daily.    Dispense:  60 tablet    Refill:  0   amphetamine-dextroamphetamine (ADDERALL) 20 MG tablet    Sig: Take 1 tablet (20 mg total) by mouth 2 (two) times daily.    Dispense:  60 tablet    Refill:  0   amphetamine-dextroamphetamine  (ADDERALL) 20 MG tablet    Sig: Take 1 tablet (20 mg total) by mouth 2 (two) times daily.    Dispense:  60 tablet    Refill:  0   cetirizine (  ZYRTEC) 10 MG tablet    Sig: Take 1 tablet (10 mg total) by mouth daily.    Dispense:  90 tablet    Refill:  3   traZODone (DESYREL) 50 MG tablet    Sig: Take 1 tablet (50 mg total) by mouth at bedtime.    Dispense:  90 tablet    Refill:  1    This patient was seen by Lynn Ito, PA-C in collaboration with Dr. Beverely Risen as a part of collaborative care agreement.   Total time spent:30 Minutes Time spent includes review of chart, medications, test results, and follow up plan with the patient.      Dr Lyndon Code Internal medicine

## 2022-03-31 ENCOUNTER — Other Ambulatory Visit: Payer: Self-pay | Admitting: Obstetrics and Gynecology

## 2022-03-31 DIAGNOSIS — N760 Acute vaginitis: Secondary | ICD-10-CM

## 2022-04-22 ENCOUNTER — Other Ambulatory Visit: Payer: Self-pay | Admitting: Obstetrics and Gynecology

## 2022-04-22 DIAGNOSIS — B9689 Other specified bacterial agents as the cause of diseases classified elsewhere: Secondary | ICD-10-CM

## 2022-05-04 NOTE — Progress Notes (Unsigned)
PCP:  Carlean Jews, PA-C   No chief complaint on file.    HPI:      [name] is a 36 y.o. [phone] whose LMP was No LMP recorded. (Menstrual status: Irregular Periods)., presents today for her annual examination.  Her menses are irregular for the past 6 months since her bariatric surgery with wt loss; every 1-2 months, lasting 7-10 days. Flow is heavier in general but also if she misses a period, with 1/2 dollar sized clots, rare BTB. Dysmenorrhea mild to mod, occurring first 1-2 days of flow, uses heating pad.   Sex activity: single partner, contraception--s/p lap BS 12/22 with Dr Tiburcio Pea.   Last Pap: 02/26/21 Results were neg cells, NEG HPV DNA; 02/15/20 Results were neg cells, POS HPV DNA. 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 There is no FH of ovarian cancer. The patient does self-breast exams.  Tobacco use: The patient denies current or previous tobacco use. Alcohol use: none No drug use.  Exercise: moderately active  She does get adequate calcium and Vitamin D in her diet. Had normal labs 5/21.  Past Medical History:  Diagnosis Date   ADHD    Anemia    PONV (postoperative nausea and vomiting)    Type AB blood, Rh negative     Past Surgical History:  Procedure Laterality Date   CHOLECYSTECTOMY     GALLBLADDER SURGERY  01/2005   LAPAROSCOPIC BILATERAL SALPINGECTOMY Bilateral 06/13/2021   Procedure: LAPAROSCOPIC BILATERAL SALPINGECTOMY;  Surgeon: Nadara Mustard, MD;  Location: [facility] ORS;  Service: Gynecology;  Laterality: Bilateral;   SLEEVE GASTROPLASTY      Family History  Problem Relation Age of Onset   Suicidality Father        2011   Diabetes Maternal Grandfather    Cancer Mother 76       hyst--? cervical    Social History   Socioeconomic  History   Marital status: Single    Spouse name: Not on file   Number of children: 1   Years of education: Not on file   Highest education level: Not on file  Occupational History   Not on file  Tobacco Use   Smoking status: Never   Smokeless tobacco: Never  Vaping Use   Vaping Use: Never used  Substance and Sexual Activity   Alcohol use: Not Currently    Comment: Rarely   Drug use: No   Sexual activity: Yes    Birth control/protection: None  Other Topics Concern   Not on file  Social History Narrative   Not on file   Social Determinants of Health   Financial Resource Strain: Not on file  Food Insecurity: Not on file  Transportation Needs: Not on file  Physical Activity: Not on file  Stress: Not on file  Social Connections: Not on file  Intimate Partner Violence: Not on file     Current Outpatient Medications:    amphetamine-dextroamphetamine (ADDERALL) 20 MG tablet, Take 1 tablet (20 mg total) by mouth 2 (two) times daily., Disp: 60 tablet, Rfl: 0   amphetamine-dextroamphetamine (ADDERALL) 20 MG tablet, Take 1 tablet (20 mg total) by mouth 2 (two) times daily., Disp: 60 tablet, Rfl: 0   amphetamine-dextroamphetamine (ADDERALL) 20 MG tablet, Take 1 tablet (  20 mg total) by mouth 2 (two) times daily., Disp: 60 tablet, Rfl: 0   azelastine (ASTELIN) 0.1 % nasal spray, PLACE 1 SPRAY INTO BOTH NOSTRILS 2 (TWO) TIMES DAILY. USE IN EACH NOSTRIL AS DIRECTED, Disp: 30 mL, Rfl: 1   Boric Acid Vaginal 600 MG SUPP, 3 days Use once day before and after menstural cycle, Disp: 30 suppository, Rfl: 1   cetirizine (ZYRTEC) 10 MG tablet, Take 1 tablet (10 mg total) by mouth daily., Disp: 90 tablet, Rfl: 3   cyclobenzaprine (FLEXERIL) 5 MG tablet, Take one tab po qhs for back spasm prn only, Disp: 25 tablet, Rfl: 0   Iron, Ferrous Sulfate, 325 (65 Fe) MG TABS, Take 325 mg by mouth daily., Disp: 30 tablet, Rfl: 2   metroNIDAZOLE (METROGEL) 0.75 % vaginal gel, Apply one applicatorful to vagina  at bedtime for 5 days, Disp: 70 g, Rfl: 1   traZODone (DESYREL) 50 MG tablet, Take 1 tablet (50 mg total) by mouth at bedtime., Disp: 90 tablet, Rfl: 1    ROS:  Review of Systems  Constitutional:  Negative for fatigue, fever and unexpected weight change.  Respiratory:  Negative for cough, shortness of breath and wheezing.   Cardiovascular:  Negative for chest pain, palpitations and leg swelling.  Gastrointestinal:  Negative for blood in stool, constipation, diarrhea, nausea and vomiting.  Endocrine: Negative for cold intolerance, heat intolerance and polyuria.  Genitourinary:  Negative for dyspareunia, dysuria, flank pain, frequency, genital sores, hematuria, menstrual problem, pelvic pain, urgency, vaginal bleeding, vaginal discharge and vaginal pain.  Musculoskeletal:  Negative for back pain, joint swelling and myalgias.  Skin:  Negative for rash.  Neurological:  Negative for dizziness, syncope, light-headedness, numbness and headaches.  Hematological:  Negative for adenopathy.  Psychiatric/Behavioral:  Negative for agitation, confusion, sleep disturbance and suicidal ideas. The patient is not nervous/anxious.    BREAST: No symptoms   Objective: There were no vitals taken for this visit.   Physical Exam Constitutional:      Appearance: She is well-developed.  Genitourinary:     Vulva normal.     Right Labia: No rash, tenderness or lesions.    Left Labia: No tenderness, lesions or rash.    No vaginal discharge, erythema or tenderness.      Right Adnexa: not tender and no mass present.    Left Adnexa: not tender and no mass present.    No cervical friability or polyp.     Uterus is not enlarged or tender.  Breasts:    Right: No mass, nipple discharge, skin change or tenderness.     Left: No mass, nipple discharge, skin change or tenderness.  Neck:     Thyroid: No thyromegaly.  Cardiovascular:     Rate and Rhythm: Normal rate and regular rhythm.     Heart sounds: Normal  heart sounds. No murmur heard. Pulmonary:     Effort: Pulmonary effort is normal.     Breath sounds: Normal breath sounds.  Abdominal:     Palpations: Abdomen is soft.     Tenderness: There is no abdominal tenderness. There is no guarding or rebound.  Musculoskeletal:        General: Normal range of motion.     Cervical back: Normal range of motion.  Lymphadenopathy:     Cervical: No cervical adenopathy.  Neurological:     General: No focal deficit present.     Mental Status: She is alert and oriented to person, place, and time.  Cranial Nerves: No cranial nerve deficit.  Skin:    General: Skin is warm and dry.  Psychiatric:        Mood and Affect: Mood normal.        Behavior: Behavior normal.        Thought Content: Thought content normal.        Judgment: Judgment normal.  Vitals reviewed.     Assessment/Plan: Encounter for annual routine gynecological examination  Cervical cancer screening - Plan: IGP, Aptima HPV,   Screening for HPV (human papillomavirus) - Plan: IGP, Aptima HPV,   Dysplasia of cervix, low grade (CIN 1) - Plan: IGP, Aptima HPV,   Irregular menses--since bariatric surg. Reassurance, should resume to normal once wt stabilizes. Also discussed endometrial ablation with TL due to heavier flow (doesn't do well with hormonal BC/IUD). F/u  with MD prn.     GYN counsel adequate intake of calcium and vitamin D, diet and exercise     F/U  No follow-ups on file.  Caleen Taaffe B. Jameir Ake, PA-C 05/04/2022 2:19 PM

## 2022-05-06 ENCOUNTER — Other Ambulatory Visit (HOSPITAL_COMMUNITY)
Admission: RE | Admit: 2022-05-06 | Discharge: 2022-05-06 | Disposition: A | Discharge status: Home / Self Care | Payer: Medicaid Other | Source: Ambulatory Visit | Attending: Obstetrics and Gynecology | Admitting: Obstetrics and Gynecology

## 2022-05-06 ENCOUNTER — Ambulatory Visit (INDEPENDENT_AMBULATORY_CARE_PROVIDER_SITE_OTHER): Payer: Medicaid Other | Admitting: Obstetrics and Gynecology

## 2022-05-06 ENCOUNTER — Encounter: Payer: Self-pay | Admitting: Obstetrics and Gynecology

## 2022-05-06 VITALS — BP 104/60 | Ht 63.0 in | Wt 173.0 lb

## 2022-05-06 DIAGNOSIS — Z124 Encounter for screening for malignant neoplasm of cervix: Secondary | ICD-10-CM | POA: Insufficient documentation

## 2022-05-06 DIAGNOSIS — Z23 Encounter for immunization: Secondary | ICD-10-CM

## 2022-05-06 DIAGNOSIS — Z113 Encounter for screening for infections with a predominantly sexual mode of transmission: Secondary | ICD-10-CM | POA: Diagnosis not present

## 2022-05-06 DIAGNOSIS — Z01419 Encounter for gynecological examination (general) (routine) without abnormal findings: Secondary | ICD-10-CM

## 2022-05-06 DIAGNOSIS — N870 Mild cervical dysplasia: Secondary | ICD-10-CM

## 2022-05-06 DIAGNOSIS — N926 Irregular menstruation, unspecified: Secondary | ICD-10-CM

## 2022-05-06 DIAGNOSIS — Z1151 Encounter for screening for human papillomavirus (HPV): Secondary | ICD-10-CM | POA: Insufficient documentation

## 2022-05-06 DIAGNOSIS — L02818 Cutaneous abscess of other sites: Secondary | ICD-10-CM

## 2022-05-06 MED ORDER — DOXYCYCLINE HYCLATE 100 MG PO CAPS: 100.0000 mg | ORAL_CAPSULE | Freq: Two times a day (BID) | ORAL | 0 refills | Status: AC

## 2022-05-06 NOTE — Patient Instructions (Signed)
I value your feedback and you entrusting us with your care. If you get a [facility] patient survey, I would appreciate you taking the time to let us know about your experience today. Thank you! ? ? ?

## 2022-05-13 LAB — CYTOLOGY - PAP
Chlamydia: NEGATIVE
Comment: NEGATIVE
Comment: NEGATIVE
Comment: NORMAL
Diagnosis: NEGATIVE
Diagnosis: REACTIVE
High risk HPV: NEGATIVE
Neisseria Gonorrhea: NEGATIVE

## 2022-05-22 ENCOUNTER — Ambulatory Visit
Admission: RE | Admit: 2022-05-22 | Discharge: 2022-05-22 | Disposition: A | Discharge status: Home / Self Care | Payer: Medicaid Other | Source: Ambulatory Visit | Attending: Emergency Medicine | Admitting: Emergency Medicine

## 2022-05-22 VITALS — BP 111/75 | HR 71 | Temp 98.2°F | Resp 18 | Ht 63.0 in | Wt 165.0 lb

## 2022-05-22 DIAGNOSIS — B349 Viral infection, unspecified: Secondary | ICD-10-CM | POA: Insufficient documentation

## 2022-05-22 DIAGNOSIS — Z1152 Encounter for screening for COVID-19: Secondary | ICD-10-CM | POA: Insufficient documentation

## 2022-05-22 LAB — RESP PANEL BY RT-PCR (FLU A&B, COVID) ARPGX2
Influenza A by PCR: NEGATIVE
Influenza B by PCR: NEGATIVE
SARS Coronavirus 2 by RT PCR: NEGATIVE

## 2022-05-22 LAB — POCT RAPID STREP A (OFFICE): Rapid Strep A Screen: NEGATIVE

## 2022-05-22 NOTE — ED Triage Notes (Signed)
Patient to Urgent Care with complaints of sore throat, body aches, and low grade fevers. Symptoms started on Tuesday. Max temp at home 101, fever last night.   Denies any other symptoms.

## 2022-05-22 NOTE — Discharge Instructions (Addendum)
Your strep test is negative.  Your COVID and Flu are pending.    Take Tylenol or ibuprofen as needed for fever or discomfort.  Rest and keep yourself hydrated.    Follow-up with your primary care provider if your symptoms are not improving.

## 2022-05-22 NOTE — ED Provider Notes (Signed)
[facility]    CSN: [id number] Arrival date & time: 05/22/22  0808      History   Chief Complaint Chief Complaint  Patient presents with   Sore Throat    Low grade fever - Entered by patient    HPI Paula Sullivan is a 36 y.o. female.  Patient presents with fever, body aches, sore throat x 1-2 days.  Tmax 101.  Treatment at home with ibuprofen.  No rash, shortness of breath, vomiting, diarrhea, or other symptoms.  Patient reports exposure to COVID 1 week ago.   The history is provided by the patient and medical records.    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 02/15/2020   Abnormal weight gain 01/09/2020   Attention deficit disorder (ADD) in adult 01/09/2020   Primary osteoarthritis of left knee 02/05/2018   Sprain of lateral collateral ligament of left knee 02/05/2018   Tear of medial meniscus of left knee, current 02/05/2018   IUD check up 01/12/2017   Headache 07/23/2016   Lower abdominal pain 07/13/2016    Past Surgical History:  Procedure Laterality Date   CHOLECYSTECTOMY     GALLBLADDER SURGERY  01/2005   LAPAROSCOPIC BILATERAL SALPINGECTOMY Bilateral 06/13/2021   Procedure: LAPAROSCOPIC BILATERAL SALPINGECTOMY;  Surgeon: Nadara Mustard, MD;  Location: [facility] ORS;  Service: Gynecology;  Laterality: Bilateral;   SLEEVE GASTROPLASTY      OB History     Gravida  4   Para  2   Term  2   Preterm      AB  2   Living  2      SAB  1   IAB      Ectopic  1   Multiple  0   Live Births  2            Home Medications    Prior to Admission medications   Medication Sig Start Date End Date Taking? Authorizing  Provider  amphetamine-dextroamphetamine (ADDERALL) 20 MG tablet Take 1 tablet (20 mg total) by mouth 2 (two) times daily. 04/27/22   McDonough, Salomon Fick, PA-C  cetirizine (ZYRTEC) 10 MG tablet Take 1 tablet (10 mg total) by mouth daily. 02/27/22   McDonough, Salomon Fick, PA-C  traZODone (DESYREL) 50 MG tablet Take 1 tablet (50 mg total) by mouth at bedtime. 02/27/22   McDonough, Salomon Fick, PA-C  norgestimate-ethinyl estradiol (ORTHO-CYCLEN) 0.25-35 MG-MCG tablet Take 1 tablet by mouth daily. 12/21/18 06/23/19  Nadara Mustard, MD    Family History Family History  Problem Relation Age of Onset   Suicidality Father        2011   Diabetes Maternal Grandfather    Cancer Mother 32       hyst--? cervical    Social History Social History   Tobacco Use   Smoking status: Never   Smokeless tobacco: Never  Vaping Use   Vaping Use: Never used  Substance Use Topics   Alcohol use: Not Currently    Comment: Rarely   Drug use: No     Allergies   Latex   Review  of Systems Review of Systems  Constitutional:  Positive for fever. Negative for chills.  HENT:  Positive for sore throat. Negative for ear pain.   Respiratory:  Negative for cough and shortness of breath.   Cardiovascular:  Negative for chest pain and palpitations.  Gastrointestinal:  Negative for diarrhea and vomiting.  Skin:  Negative for rash.  All other systems reviewed and are negative.    Physical Exam Triage Vital Signs ED Triage Vitals  Enc Vitals Group     BP      Pulse      Resp      Temp      Temp src      SpO2      Weight      Height      Head Circumference      Peak Flow      Pain Score      Pain Loc      Pain Edu?      Excl. in GC?    No data found.  Updated Vital Signs BP 111/75   Pulse 71   Temp 98.2 F (36.8 C)   Resp 18   Ht 5\' 3"  (1.6 m)   Wt 165 lb (74.8 kg)   LMP 04/30/2022 (Approximate)   SpO2 96%   BMI 29.23 kg/m   Visual Acuity Right Eye Distance:   Left Eye Distance:    Bilateral Distance:    Right Eye Near:   Left Eye Near:    Bilateral Near:     Physical Exam Vitals and nursing note reviewed.  Constitutional:      General: She is not in acute distress.    Appearance: Normal appearance. She is well-developed. She is not ill-appearing.  HENT:     Right Ear: Tympanic membrane normal.     Left Ear: Tympanic membrane normal.     Nose: Nose normal.     Mouth/Throat:     Mouth: Mucous membranes are moist.     Pharynx: Oropharynx is clear.     Comments: Clear PND. Cardiovascular:     Rate and Rhythm: Normal rate and regular rhythm.     Heart sounds: Normal heart sounds.  Pulmonary:     Effort: Pulmonary effort is normal. No respiratory distress.     Breath sounds: Normal breath sounds.  Musculoskeletal:     Cervical back: Neck supple.  Skin:    General: Skin is warm and dry.  Neurological:     Mental Status: She is alert.  Psychiatric:        Mood and Affect: Mood normal.        Behavior: Behavior normal.      UC Treatments / Results  Labs (all labs ordered are listed, but only abnormal results are displayed) Labs Reviewed  RESP PANEL BY RT-PCR (FLU A&B, COVID) ARPGX2  POCT RAPID STREP A (OFFICE)    EKG   Radiology No results found.  Procedures Procedures (including critical care time)  Medications Ordered in UC Medications - No data to display  Initial Impression / Assessment and Plan / UC Course  I have reviewed the triage vital signs and the nursing notes.  Pertinent labs & imaging results that were available during my care of the patient were reviewed by me and considered in my medical decision making (see chart for details).    Viral illness.  Rapid strep negative.  COVID and Flu pending.  Discussed symptomatic treatment including Tylenol or ibuprofen, rest, hydration.  Instructed patient to follow up with PCP if symptoms are not improving.  She agrees to plan of care.   Final Clinical Impressions(s) / UC Diagnoses    Final diagnoses:  Viral illness     Discharge Instructions      Your strep test is negative.  Your COVID and Flu are pending.    Take Tylenol or ibuprofen as needed for fever or discomfort.  Rest and keep yourself hydrated.    Follow-up with your primary care provider if your symptoms are not improving.         ED Prescriptions   None    PDMP not reviewed this encounter.   Mickie Bail, NP 05/22/22 [phone]

## 2022-05-27 ENCOUNTER — Ambulatory Visit: Payer: Medicaid Other | Admitting: Nurse Practitioner

## 2022-05-27 ENCOUNTER — Encounter: Payer: Self-pay | Admitting: Nurse Practitioner

## 2022-05-27 ENCOUNTER — Ambulatory Visit
Admission: RE | Admit: 2022-05-27 | Discharge: 2022-05-27 | Disposition: A | Discharge status: Home / Self Care | Payer: Medicaid Other | Source: Ambulatory Visit | Attending: Emergency Medicine | Admitting: Emergency Medicine

## 2022-05-27 VITALS — BP 131/81 | HR 93 | Temp 98.3°F | Resp 16 | Ht 63.0 in | Wt 168.2 lb

## 2022-05-27 VITALS — BP 125/86 | HR 79 | Temp 98.1°F | Resp 18

## 2022-05-27 DIAGNOSIS — Z1152 Encounter for screening for COVID-19: Secondary | ICD-10-CM | POA: Insufficient documentation

## 2022-05-27 DIAGNOSIS — F418 Other specified anxiety disorders: Secondary | ICD-10-CM

## 2022-05-27 DIAGNOSIS — R051 Acute cough: Secondary | ICD-10-CM

## 2022-05-27 DIAGNOSIS — G4709 Other insomnia: Secondary | ICD-10-CM | POA: Diagnosis not present

## 2022-05-27 DIAGNOSIS — J069 Acute upper respiratory infection, unspecified: Secondary | ICD-10-CM | POA: Diagnosis present

## 2022-05-27 DIAGNOSIS — F988 Other specified behavioral and emotional disorders with onset usually occurring in childhood and adolescence: Secondary | ICD-10-CM

## 2022-05-27 DIAGNOSIS — R059 Cough, unspecified: Secondary | ICD-10-CM | POA: Insufficient documentation

## 2022-05-27 MED ORDER — AMPHETAMINE-DEXTROAMPHETAMINE 20 MG PO TABS: 20.0000 mg | ORAL_TABLET | Freq: Two times a day (BID) | ORAL | 0 refills | Status: DC

## 2022-05-27 MED ORDER — TRAZODONE HCL 100 MG PO TABS: 50.0000 mg | ORAL_TABLET | Freq: Every day | ORAL | 1 refills | Status: DC

## 2022-05-27 MED ORDER — ESCITALOPRAM OXALATE 10 MG PO TABS: 10.0000 mg | ORAL_TABLET | Freq: Every day | ORAL | 1 refills | Status: DC

## 2022-05-27 NOTE — ED Triage Notes (Signed)
Patient to Urgent Care with request for repeat covid testing. Reports that she cannot return to work without it.

## 2022-05-27 NOTE — Discharge Instructions (Addendum)
Follow up with your primary care provider if your symptoms are not improving.     

## 2022-05-27 NOTE — Progress Notes (Signed)
[facility] [address] [place], [place] [address]  Internal MEDICINE  Office Visit Note  Patient Name: Paula Sullivan  [id number]  [id number]  Date of Service: 05/27/2022  Chief Complaint  Patient presents with   Follow-up    Follow up- med refills    HPI Paula Sullivan presents for a follow up visit for ADHD, depression and anxiety. She also has trouble sleeping and needs refills Insomnia -- still having trouble sleeping with 50-100 mg of trazodone.  ADHD -- current adderall dose is effective. Denies any palpitations or other adverse side effects. BP and heart rate are normal.  Depression and anxiety -- has been having more depressed mood and increased anxiety, wants to go back on lexapro. Was taking 10 mg daily before she stopped taking it.      Current Medication: Outpatient Encounter Medications as of 05/27/2022  Medication Sig   cetirizine (ZYRTEC) 10 MG tablet Take 1 tablet (10 mg total) by mouth daily.   escitalopram (LEXAPRO) 10 MG tablet Take 1 tablet (10 mg total) by mouth daily.   traZODone (DESYREL) 100 MG tablet Take 0.5-2 tablets (50-200 mg total) by mouth at bedtime.   [DISCONTINUED] amphetamine-dextroamphetamine (ADDERALL) 20 MG tablet Take 1 tablet (20 mg total) by mouth 2 (two) times daily.   [DISCONTINUED] traZODone (DESYREL) 50 MG tablet Take 1 tablet (50 mg total) by mouth at bedtime.   amphetamine-dextroamphetamine (ADDERALL) 20 MG tablet Take 1 tablet (20 mg total) by mouth 2 (two) times daily.   [START ON 06/24/2022] amphetamine-dextroamphetamine (ADDERALL) 20 MG tablet Take 1 tablet (20 mg total) by mouth 2 (two) times daily.   [START ON 07/22/2022] amphetamine-dextroamphetamine (ADDERALL) 20 MG tablet Take 1 tablet (20 mg total) by mouth 2 (two) times daily.   [DISCONTINUED] norgestimate-ethinyl estradiol (ORTHO-CYCLEN) 0.25-35 MG-MCG tablet Take 1 tablet by mouth daily.   No facility-administered encounter medications on file as of 05/27/2022.     Surgical History: Past Surgical History:  Procedure Laterality Date   CHOLECYSTECTOMY     GALLBLADDER SURGERY  01/2005   LAPAROSCOPIC BILATERAL SALPINGECTOMY Bilateral 06/13/2021   Procedure: LAPAROSCOPIC BILATERAL SALPINGECTOMY;  Surgeon: Nadara Mustard, MD;  Location: [facility] ORS;  Service: Gynecology;  Laterality: Bilateral;   SLEEVE GASTROPLASTY      Medical History: Past Medical History:  Diagnosis Date   ADHD    Anemia    PONV (postoperative nausea and vomiting)    Type AB blood, Rh negative     Family History: Family History  Problem Relation Age of Onset   Suicidality Father        2011   Diabetes Maternal Grandfather    Cancer Mother 34       hyst--? cervical    Social History   Socioeconomic History   Marital status: Single    Spouse name: Not on file   Number of children: 1   Years of education: Not on file   Highest education level: Not on file  Occupational History   Not on file  Tobacco Use   Smoking status: Never   Smokeless tobacco: Never  Vaping Use   Vaping Use: Never used  Substance and Sexual Activity   Alcohol use: Not Currently    Comment: Rarely   Drug use: No   Sexual activity: Yes    Birth control/protection: None  Other Topics Concern   Not on file  Social History Narrative   Not on file   Social Determinants of Corporate investment banker  Strain: Not on file  Food Insecurity: Not on file  Transportation Needs: Not on file  Physical Activity: Not on file  Stress: Not on file  Social Connections: Not on file  Intimate Partner Violence: Not on file      Review of Systems  Constitutional:  Negative for chills, fatigue and unexpected weight change.  HENT:  Positive for postnasal drip. Negative for congestion, rhinorrhea, sneezing and sore throat.   Eyes:  Negative for redness.  Respiratory:  Negative for cough, chest tightness and shortness of breath.   Cardiovascular:  Negative for chest pain and palpitations.   Gastrointestinal:  Negative for abdominal pain, constipation, diarrhea, nausea and vomiting.  Genitourinary:  Negative for dysuria and frequency.  Musculoskeletal:  Negative for arthralgias, back pain, joint swelling and neck pain.  Skin:  Negative for rash.  Neurological: Negative.  Negative for tremors and numbness.  Hematological:  Negative for adenopathy. Does not bruise/bleed easily.  Psychiatric/Behavioral:  Negative for behavioral problems (Depression), sleep disturbance and suicidal ideas. The patient is not nervous/anxious.     Vital Signs: BP 131/81   Pulse 93   Temp 98.3 F (36.8 C)   Resp 16   Ht 5\' 3"  (1.6 m)   Wt 168 lb 3.2 oz (76.3 kg)   LMP 04/30/2022 (Approximate)   SpO2 97%   BMI 29.80 kg/m    Physical Exam Vitals reviewed.  Constitutional:      General: She is not in acute distress.    Appearance: Normal appearance. She is not ill-appearing.  HENT:     Head: Normocephalic and atraumatic.  Eyes:     Pupils: Pupils are equal, round, and reactive to light.  Cardiovascular:     Rate and Rhythm: Normal rate and regular rhythm.  Pulmonary:     Effort: Pulmonary effort is normal. No respiratory distress.  Neurological:     Mental Status: She is alert and oriented to person, place, and time.  Psychiatric:        Mood and Affect: Mood normal.        Behavior: Behavior normal.       Assessment/Plan: 1. Other insomnia Trazodone dose adjusted, may take up to 200 mg. Follow up in 3 months with Lauren PA-C - traZODone (DESYREL) 100 MG tablet; Take 0.5-2 tablets (50-200 mg total) by mouth at bedtime.  Dispense: 180 tablet; Refill: 1  2. Attention deficit disorder (ADD) in adult Refills x3 months ordered, follow up in 3 months for additional refills and will need UDS at next office visit.  - amphetamine-dextroamphetamine (ADDERALL) 20 MG tablet; Take 1 tablet (20 mg total) by mouth 2 (two) times daily.  Dispense: 60 tablet; Refill: 0 -  amphetamine-dextroamphetamine (ADDERALL) 20 MG tablet; Take 1 tablet (20 mg total) by mouth 2 (two) times daily.  Dispense: 60 tablet; Refill: 0 - amphetamine-dextroamphetamine (ADDERALL) 20 MG tablet; Take 1 tablet (20 mg total) by mouth 2 (two) times daily.  Dispense: 60 tablet; Refill: 0  3. Anxiety with depression Restart lexapro as prescribed. Follow up in 3 months or sooner if needed. - escitalopram (LEXAPRO) 10 MG tablet; Take 1 tablet (10 mg total) by mouth daily.  Dispense: 90 tablet; Refill: 1   General Counseling: Ryka verbalizes understanding of the findings of todays visit and agrees with plan of treatment. I have discussed any further diagnostic evaluation that may be needed or ordered today. We also reviewed her medications today. she has been encouraged to call the office with any questions or  concerns that should arise related to todays visit.    No orders of the defined types were placed in this encounter.   Meds ordered this encounter  Medications   traZODone (DESYREL) 100 MG tablet    Sig: Take 0.5-2 tablets (50-200 mg total) by mouth at bedtime.    Dispense:  180 tablet    Refill:  1   escitalopram (LEXAPRO) 10 MG tablet    Sig: Take 1 tablet (10 mg total) by mouth daily.    Dispense:  90 tablet    Refill:  1   amphetamine-dextroamphetamine (ADDERALL) 20 MG tablet    Sig: Take 1 tablet (20 mg total) by mouth 2 (two) times daily.    Dispense:  60 tablet    Refill:  0    Fill for november   amphetamine-dextroamphetamine (ADDERALL) 20 MG tablet    Sig: Take 1 tablet (20 mg total) by mouth 2 (two) times daily.    Dispense:  60 tablet    Refill:  0    Fill for december   amphetamine-dextroamphetamine (ADDERALL) 20 MG tablet    Sig: Take 1 tablet (20 mg total) by mouth 2 (two) times daily.    Dispense:  60 tablet    Refill:  0    Fill for January    Return in about 3 months (around 08/27/2022) for F/U, ADHD med check with Lauren PA-C.   Total time spent:30  Minutes Time spent includes review of chart, medications, test results, and follow up plan with the patient.   [place] Controlled Substance Database was reviewed by me.  This patient was seen by Sallyanne Kuster, FNP-C in collaboration with Dr. Beverely Risen as a part of collaborative care agreement.   Tyriana Helmkamp R. Tedd Sias, MSN, FNP-C Internal medicine

## 2022-05-27 NOTE — ED Provider Notes (Signed)
[facility]    CSN: [id number] Arrival date & time: 05/27/22  1200      History   Chief Complaint Chief Complaint  Patient presents with   Cough    Need to take another Covid test to go back to work since I was exposed. - Entered by patient    HPI Paula Sullivan is a 36 y.o. female.  Patient presents with request for repeat COVID test due to ongoing cough.  She states her work will not allow her to return unless she has a negative COVID test.  She denies fever, shortness of breath, or other symptoms.  Patient was seen here on 05/22/2022; diagnosed with viral illness; negative strep, COVID, and Flu.    The history is provided by the patient and medical records.    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 positive 02/15/2020   Abnormal weight gain 01/09/2020   Attention deficit disorder (ADD) in adult 01/09/2020   Primary osteoarthritis of left knee 02/05/2018   Sprain of lateral collateral ligament of left knee 02/05/2018   Tear of medial meniscus of left knee, current 02/05/2018   IUD check up 01/12/2017   Headache 07/23/2016   Lower abdominal pain 07/13/2016    Past Surgical History:  Procedure Laterality Date   CHOLECYSTECTOMY     GALLBLADDER SURGERY  01/2005   LAPAROSCOPIC BILATERAL SALPINGECTOMY Bilateral 06/13/2021   Procedure: LAPAROSCOPIC BILATERAL SALPINGECTOMY;  Surgeon: Nadara Mustard, MD;  Location: [facility] ORS;  Service: Gynecology;  Laterality: Bilateral;   SLEEVE GASTROPLASTY      OB History     Gravida  4   Para  2   Term  2   Preterm      AB  2   Living  2      SAB  1   IAB      Ectopic  1    Multiple  0   Live Births  2            Home Medications    Prior to Admission medications   Medication Sig Start Date End Date Taking? Authorizing Provider  amphetamine-dextroamphetamine (ADDERALL) 20 MG tablet Take 1 tablet (20 mg total) by mouth 2 (two) times daily. 05/27/22   Sallyanne Kuster, NP  amphetamine-dextroamphetamine (ADDERALL) 20 MG tablet Take 1 tablet (20 mg total) by mouth 2 (two) times daily. 06/24/22   Sallyanne Kuster, NP  amphetamine-dextroamphetamine (ADDERALL) 20 MG tablet Take 1 tablet (20 mg total) by mouth 2 (two) times daily. 07/22/22   Sallyanne Kuster, NP  cetirizine (ZYRTEC) 10 MG tablet Take 1 tablet (10 mg total) by mouth daily. 02/27/22   McDonough, Lauren K, PA-C  escitalopram (LEXAPRO) 10 MG tablet Take 1 tablet (10 mg total) by mouth daily. 05/27/22   Sallyanne Kuster, NP  traZODone (DESYREL) 100 MG tablet Take 0.5-2 tablets (50-200 mg total) by mouth at bedtime. 05/27/22   Sallyanne Kuster, NP  norgestimate-ethinyl estradiol (ORTHO-CYCLEN) 0.25-35 MG-MCG tablet Take 1 tablet by mouth daily. 12/21/18 06/23/19  Nadara Mustard, MD    Family History Family  History  Problem Relation Age of Onset   Suicidality Father        2011   Diabetes Maternal Grandfather    Cancer Mother 3       hyst--? cervical    Social History Social History   Tobacco Use   Smoking status: Never   Smokeless tobacco: Never  Vaping Use   Vaping Use: Never used  Substance Use Topics   Alcohol use: Not Currently    Comment: Rarely   Drug use: No     Allergies   Latex   Review of Systems Review of Systems  Constitutional:  Negative for chills and fever.  HENT:  Negative for ear pain and sore throat.   Respiratory:  Positive for cough. Negative for shortness of breath.   Cardiovascular:  Negative for chest pain and palpitations.  Gastrointestinal:  Negative for diarrhea and vomiting.  Skin:  Negative for rash.  All other systems reviewed and are  negative.    Physical Exam Triage Vital Signs ED Triage Vitals [05/27/22 1213]  Enc Vitals Group     BP      Pulse Rate 79     Resp 18     Temp 98.1 F (36.7 C)     Temp src      SpO2 98 %     Weight      Height      Head Circumference      Peak Flow      Pain Score      Pain Loc      Pain Edu?      Excl. in [place]?    No data found.  Updated Vital Signs BP 125/86   Pulse 79   Temp 98.1 F (36.7 C)   Resp 18   LMP 04/30/2022 (Approximate)   SpO2 98%   Visual Acuity Right Eye Distance:   Left Eye Distance:   Bilateral Distance:    Right Eye Near:   Left Eye Near:    Bilateral Near:     Physical Exam Vitals and nursing note reviewed.  Constitutional:      General: She is not in acute distress.    Appearance: Normal appearance. She is well-developed. She is not ill-appearing.  HENT:     Right Ear: Tympanic membrane normal.     Left Ear: Tympanic membrane normal.     Nose: Nose normal.     Mouth/Throat:     Mouth: Mucous membranes are moist.     Pharynx: Oropharynx is clear.  Cardiovascular:     Rate and Rhythm: Normal rate and regular rhythm.     Heart sounds: Normal heart sounds.  Pulmonary:     Effort: Pulmonary effort is normal. No respiratory distress.     Breath sounds: Normal breath sounds.  Musculoskeletal:     Cervical back: Neck supple.  Skin:    General: Skin is warm and dry.  Neurological:     Mental Status: She is alert.  Psychiatric:        Mood and Affect: Mood normal.        Behavior: Behavior normal.      UC Treatments / Results  Labs (all labs ordered are listed, but only abnormal results are displayed) Labs Reviewed  SARS CORONAVIRUS 2 (TAT 6-24 HRS)    EKG   Radiology No results found.  Procedures Procedures (including critical care time)  Medications Ordered in UC Medications - No data to display  Initial Impression /  Assessment and Plan / UC Course  I have reviewed the triage vital signs and the nursing  notes.  Pertinent labs & imaging results that were available during my care of the patient were reviewed by me and considered in my medical decision making (see chart for details).    Cough, Viral URI.  COVID pending.  Discussed symptomatic treatment including Tylenol or ibuprofen, rest, hydration.  Instructed patient to follow up with PCP if symptoms are not improving.  She agrees to plan of care.  Final Clinical Impressions(s) / UC Diagnoses   Final diagnoses:  Acute cough  Viral URI     Discharge Instructions      Follow up with your primary care provider if your symptoms are not improving.        ED Prescriptions   None    PDMP not reviewed this encounter.   Mickie Bail, NP 05/27/22 1230

## 2022-05-28 LAB — SARS CORONAVIRUS 2 (TAT 6-24 HRS): SARS Coronavirus 2: NEGATIVE

## 2022-05-29 ENCOUNTER — Ambulatory Visit: Payer: Medicaid Other | Admitting: Physician Assistant

## 2022-06-02 ENCOUNTER — Other Ambulatory Visit: Payer: Self-pay

## 2022-06-02 ENCOUNTER — Encounter: Payer: Self-pay | Admitting: Physician Assistant

## 2022-06-02 MED ORDER — AZITHROMYCIN 250 MG PO TABS: ORAL_TABLET | ORAL | 0 refills | Status: DC

## 2022-06-02 NOTE — Telephone Encounter (Signed)
Zpak uad

## 2022-06-17 ENCOUNTER — Encounter: Payer: Self-pay | Admitting: Nurse Practitioner

## 2022-06-17 ENCOUNTER — Ambulatory Visit: Payer: Medicaid Other | Admitting: Nurse Practitioner

## 2022-06-17 VITALS — BP 107/65 | HR 98 | Temp 98.3°F | Resp 16 | Ht 63.0 in | Wt 170.0 lb

## 2022-06-17 DIAGNOSIS — B379 Candidiasis, unspecified: Secondary | ICD-10-CM | POA: Diagnosis not present

## 2022-06-17 DIAGNOSIS — T3695XA Adverse effect of unspecified systemic antibiotic, initial encounter: Secondary | ICD-10-CM

## 2022-06-17 DIAGNOSIS — J029 Acute pharyngitis, unspecified: Secondary | ICD-10-CM

## 2022-06-17 DIAGNOSIS — J208 Acute bronchitis due to other specified organisms: Secondary | ICD-10-CM

## 2022-06-17 DIAGNOSIS — B9689 Other specified bacterial agents as the cause of diseases classified elsewhere: Secondary | ICD-10-CM

## 2022-06-17 DIAGNOSIS — R52 Pain, unspecified: Secondary | ICD-10-CM

## 2022-06-17 LAB — POCT INFLUENZA A/B
Influenza A, POC: NEGATIVE
Influenza B, POC: NEGATIVE

## 2022-06-17 LAB — POCT RAPID STREP A (OFFICE): Rapid Strep A Screen: NEGATIVE

## 2022-06-17 MED ORDER — FLUCONAZOLE 150 MG PO TABS: 150.0000 mg | ORAL_TABLET | Freq: Once | ORAL | 0 refills | Status: AC

## 2022-06-17 MED ORDER — AMOXICILLIN-POT CLAVULANATE 875-125 MG PO TABS: 1.0000 | ORAL_TABLET | Freq: Two times a day (BID) | ORAL | 0 refills | Status: DC

## 2022-06-17 NOTE — Progress Notes (Signed)
[facility] [address],  [address]  Internal MEDICINE  Office Visit Note  Patient Name: Paula Sullivan  [id number]  [id number]  Date of Service: 06/17/2022  Chief Complaint  Patient presents with   Acute Visit    Ear infection/ strep      HPI Paula Sullivan presents for an acute sick visit for URI  --onset: yesterday Body aches  Left jaw and ear pain  Drainage No sinus pressure yes Wheezy/SOB Negative for covid, flu and strep  Exposed to a person with strep throat    Current Medication:  Outpatient Encounter Medications as of 06/17/2022  Medication Sig   amoxicillin-clavulanate (AUGMENTIN) 875-125 MG tablet Take 1 tablet by mouth 2 (two) times daily. Take with food   amphetamine-dextroamphetamine (ADDERALL) 20 MG tablet Take 1 tablet (20 mg total) by mouth 2 (two) times daily.   [START ON 06/24/2022] amphetamine-dextroamphetamine (ADDERALL) 20 MG tablet Take 1 tablet (20 mg total) by mouth 2 (two) times daily.   [START ON 07/22/2022] amphetamine-dextroamphetamine (ADDERALL) 20 MG tablet Take 1 tablet (20 mg total) by mouth 2 (two) times daily.   azithromycin (ZITHROMAX) 250 MG tablet Use as directed for 5 days   cetirizine (ZYRTEC) 10 MG tablet Take 1 tablet (10 mg total) by mouth daily.   escitalopram (LEXAPRO) 10 MG tablet Take 1 tablet (10 mg total) by mouth daily.   fluconazole (DIFLUCAN) 150 MG tablet Take 1 tablet (150 mg total) by mouth once for 1 dose. May take an additional dose after 3 days if still symptomatic.   traZODone (DESYREL) 100 MG tablet Take 0.5-2 tablets (50-200 mg total) by mouth at bedtime.   [DISCONTINUED] norgestimate-ethinyl estradiol (ORTHO-CYCLEN) 0.25-35 MG-MCG tablet Take 1 tablet by mouth daily.   No facility-administered encounter medications on file as of 06/17/2022.      Medical History: Past Medical History:  Diagnosis Date   ADHD    Anemia    PONV (postoperative nausea and vomiting)    Type AB blood, Rh  negative      Vital Signs: BP 107/65   Pulse 98   Temp 98.3 F (36.8 C)   Resp 16   Ht 5\' 3"  (1.6 m)   Wt 170 lb (77.1 kg)   SpO2 98%   BMI 30.11 kg/m    Review of Systems  Constitutional:  Positive for appetite change, chills, fatigue and fever.  HENT:  Positive for congestion, ear pain and postnasal drip. Negative for sinus pressure, sinus pain and sore throat.   Respiratory:  Positive for cough, chest tightness, shortness of breath and wheezing.   Cardiovascular:  Negative for chest pain and palpitations.  Gastrointestinal: Negative.   Musculoskeletal:  Positive for myalgias.    Physical Exam Vitals reviewed.  Constitutional:      General: She is not in acute distress.    Appearance: Normal appearance. She is obese. She is ill-appearing.  HENT:     Head: Normocephalic and atraumatic.     Right Ear: Tympanic membrane and ear canal normal.     Left Ear: Tympanic membrane and ear canal normal.     Nose: Mucosal edema, congestion and rhinorrhea present.     Right Turbinates: Swollen and pale.     Left Turbinates: Swollen and pale.     Right Sinus: Frontal sinus tenderness present. No maxillary sinus tenderness.     Left Sinus: Frontal sinus tenderness present. No maxillary sinus tenderness.     Mouth/Throat:     Lips:  Pink.     Mouth: Mucous membranes are dry.     Pharynx: Posterior oropharyngeal erythema present.     Tonsils: 2+ on the right. 2+ on the left.  Eyes:     General: Lids are normal. Vision grossly intact. Gaze aligned appropriately.  Pulmonary:     Breath sounds: Examination of the right-upper field reveals rales. Examination of the left-upper field reveals rales. Rales present.  Neurological:     Mental Status: She is alert.       Assessment/Plan: 1. Acute bacterial bronchitis Empiric antibiotic treatment prescribed - amoxicillin-clavulanate (AUGMENTIN) 875-125 MG tablet; Take 1 tablet by mouth 2 (two) times daily. Take with food  Dispense: 20  tablet; Refill: 0  2. Sore throat Negative for strep - POCT rapid strep A  3. Body aches Negative for flu - POCT Influenza A/B  4. Antibiotic-induced yeast infection Fluconazole sent just in case - fluconazole (DIFLUCAN) 150 MG tablet; Take 1 tablet (150 mg total) by mouth once for 1 dose. May take an additional dose after 3 days if still symptomatic.  Dispense: 3 tablet; Refill: 0   General Counseling: Paula Sullivan verbalizes understanding of the findings of todays visit and agrees with plan of treatment. I have discussed any further diagnostic evaluation that may be needed or ordered today. We also reviewed her medications today. she has been encouraged to call the office with any questions or concerns that should arise related to todays visit.    Counseling:    Orders Placed This Encounter  Procedures   POCT Influenza A/B   POCT rapid strep A    Meds ordered this encounter  Medications   amoxicillin-clavulanate (AUGMENTIN) 875-125 MG tablet    Sig: Take 1 tablet by mouth 2 (two) times daily. Take with food    Dispense:  20 tablet    Refill:  0    Fill this asap thanks   fluconazole (DIFLUCAN) 150 MG tablet    Sig: Take 1 tablet (150 mg total) by mouth once for 1 dose. May take an additional dose after 3 days if still symptomatic.    Dispense:  3 tablet    Refill:  0    Return if symptoms worsen or fail to improve.  [place] Controlled Substance Database was reviewed by me for overdose risk score (ORS)  Time spent:30 Minutes Time spent with patient included reviewing progress notes, labs, imaging studies, and discussing plan for follow up.   This patient was seen by Sallyanne Kuster, FNP-C in collaboration with Dr. Beverely Risen as a part of collaborative care agreement.  Jeidi Gilles R. Tedd Sias, MSN, FNP-C Internal Medicine

## 2022-06-18 ENCOUNTER — Encounter: Payer: Self-pay | Admitting: Nurse Practitioner

## 2022-08-04 ENCOUNTER — Encounter: Payer: 59 | Admitting: Physician Assistant

## 2022-08-15 ENCOUNTER — Ambulatory Visit
Admission: EM | Admit: 2022-08-15 | Discharge: 2022-08-15 | Disposition: A | Discharge status: Home / Self Care | Payer: Medicaid Other | Attending: Physician Assistant | Admitting: Physician Assistant

## 2022-08-15 DIAGNOSIS — R0981 Nasal congestion: Secondary | ICD-10-CM | POA: Insufficient documentation

## 2022-08-15 DIAGNOSIS — J069 Acute upper respiratory infection, unspecified: Secondary | ICD-10-CM | POA: Insufficient documentation

## 2022-08-15 DIAGNOSIS — H6992 Unspecified Eustachian tube disorder, left ear: Secondary | ICD-10-CM

## 2022-08-15 LAB — GROUP A STREP BY PCR: Group A Strep by PCR: NOT DETECTED

## 2022-08-15 NOTE — ED Provider Notes (Signed)
[facility]    CSN: [id number] Arrival date & time: 08/15/22  1050      History   Chief Complaint Chief Complaint  Patient presents with   Sore Throat    HPI Paula Sullivan is a 37 y.o. female presenting for approximately 1 week history of left-sided ear pain and nasal congestion.  She denies fever, fatigue, sore throat, cough, sinus pain, chest pain, shortness of breath.  Reports that her son was diagnosed with strep yesterday and she has concerns for that.  She says that she never gets a sore throat when she has strep.  Has been taking over-the-counter decongestants for symptoms.  No other concerns.  HPI  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 positive 02/15/2020   Abnormal weight gain 01/09/2020   Attention deficit disorder (ADD) in adult 01/09/2020   Primary osteoarthritis of left knee 02/05/2018   Sprain of lateral collateral ligament of left knee 02/05/2018   Tear of medial meniscus of left knee, current 02/05/2018   IUD check up 01/12/2017   Headache 07/23/2016   Lower abdominal pain 07/13/2016    Past Surgical History:  Procedure Laterality Date   CHOLECYSTECTOMY     GALLBLADDER SURGERY  01/2005   LAPAROSCOPIC BILATERAL SALPINGECTOMY Bilateral 06/13/2021   Procedure: LAPAROSCOPIC BILATERAL SALPINGECTOMY;  Surgeon: Gae Dry, MD;  Location: [facility] ORS;  Service: Gynecology;  Laterality: Bilateral;   SLEEVE GASTROPLASTY      OB History     Gravida  4   Para  2   Term  2   Preterm      AB  2   Living  2      SAB  1   IAB      Ectopic  1   Multiple  0   Live Births  2            Home  Medications    Prior to Admission medications   Medication Sig Start Date End Date Taking? Authorizing Provider  amphetamine-dextroamphetamine (ADDERALL) 20 MG tablet Take 1 tablet (20 mg total) by mouth 2 (two) times daily. 05/27/22  Yes Abernathy, Alyssa, NP  amphetamine-dextroamphetamine (ADDERALL) 20 MG tablet Take 1 tablet (20 mg total) by mouth 2 (two) times daily. 06/24/22  Yes Abernathy, Alyssa, NP  amphetamine-dextroamphetamine (ADDERALL) 20 MG tablet Take 1 tablet (20 mg total) by mouth 2 (two) times daily. 07/22/22  Yes Abernathy, Yetta Flock, NP  cetirizine (ZYRTEC) 10 MG tablet Take 1 tablet (10 mg total) by mouth daily. 02/27/22  Yes McDonough, Lauren K, PA-C  amoxicillin-clavulanate (AUGMENTIN) 875-125 MG tablet Take 1 tablet by mouth 2 (two) times daily. Take with food 06/17/22   Jonetta Osgood, NP  azithromycin (ZITHROMAX) 250 MG tablet Use as directed for 5 days 06/02/22   Lavera Guise, MD  escitalopram (LEXAPRO) 10 MG tablet Take 1 tablet (10 mg total) by mouth daily. 05/27/22   Jonetta Osgood, NP  traZODone (DESYREL) 100 MG tablet Take 0.5-2 tablets (50-200 mg total) by mouth at bedtime. 05/27/22   Jonetta Osgood, NP  norgestimate-ethinyl estradiol (ORTHO-CYCLEN)  0.25-35 MG-MCG tablet Take 1 tablet by mouth daily. 12/21/18 06/23/19  Gae Dry, MD    Family History Family History  Problem Relation Age of Onset   Suicidality Father        2011   Diabetes Maternal Grandfather    Cancer Mother 25       hyst--? cervical    Social History Social History   Tobacco Use   Smoking status: Never   Smokeless tobacco: Never  Vaping Use   Vaping Use: Never used  Substance Use Topics   Alcohol use: Not Currently    Comment: Rarely   Drug use: No     Allergies   Latex   Review of Systems Review of Systems  Constitutional:  Negative for chills, diaphoresis, fatigue and fever.  HENT:  Positive for congestion, ear pain and rhinorrhea. Negative for sinus pressure,  sinus pain and sore throat.   Respiratory:  Negative for cough and shortness of breath.   Gastrointestinal:  Negative for abdominal pain, nausea and vomiting.  Musculoskeletal:  Negative for arthralgias and myalgias.  Skin:  Negative for rash.  Neurological:  Negative for weakness and headaches.  Hematological:  Negative for adenopathy.     Physical Exam Triage Vital Signs ED Triage Vitals  Enc Vitals Group     BP --      Pulse --      Resp --      Temp --      Temp Source 08/15/22 1116 Oral     SpO2 --      Weight 08/15/22 1115 165 lb (74.8 kg)     Height 08/15/22 1115 5\' 3"  (1.6 m)     Head Circumference --      Peak Flow --      Pain Score 08/15/22 1114 4     Pain Loc --      Pain Edu? --      Excl. in [place]? --    No data found.  Updated Vital Signs BP 108/77 (BP Location: Left Arm)   Pulse 68   Temp 99 F (37.2 C) (Oral)   Resp 18   Ht 5\' 3"  (1.6 m)   Wt 165 lb (74.8 kg)   LMP 08/08/2022   SpO2 99%   BMI 29.23 kg/m   Physical Exam Vitals and nursing note reviewed.  Constitutional:      General: She is not in acute distress.    Appearance: Normal appearance. She is not ill-appearing or toxic-appearing.  HENT:     Head: Normocephalic and atraumatic.     Right Ear: Tympanic membrane, ear canal and external ear normal.     Left Ear: Ear canal and external ear normal. A middle ear effusion is present.     Nose: Congestion present.     Mouth/Throat:     Mouth: Mucous membranes are moist.     Pharynx: Oropharynx is clear.  Eyes:     General: No scleral icterus.       Right eye: No discharge.        Left eye: No discharge.     Conjunctiva/sclera: Conjunctivae normal.  Cardiovascular:     Rate and Rhythm: Normal rate and regular rhythm.     Heart sounds: Normal heart sounds.  Pulmonary:     Effort: Pulmonary effort is normal. No respiratory distress.     Breath sounds: Normal breath sounds.  Musculoskeletal:     Cervical back: Neck supple.  Skin:  General: Skin is dry.  Neurological:     General: No focal deficit present.     Mental Status: She is alert. Mental status is at baseline.     Motor: No weakness.     Gait: Gait normal.  Psychiatric:        Mood and Affect: Mood normal.        Behavior: Behavior normal.        Thought Content: Thought content normal.      UC Treatments / Results  Labs (all labs ordered are listed, but only abnormal results are displayed) Labs Reviewed  GROUP A STREP BY PCR    EKG   Radiology No results found.  Procedures Procedures (including critical care time)  Medications Ordered in UC Medications - No data to display  Initial Impression / Assessment and Plan / UC Course  I have reviewed the triage vital signs and the nursing notes.  Pertinent labs & imaging results that were available during my care of the patient were reviewed by me and considered in my medical decision making (see chart for details).   37 year old female presents for 1 week history of left-sided ear pain and nasal congestion.  Exposed to strep through her son.  Believes she may have strep. Denies sore throat or fever.  Vitals normal stable patient is overall well-appearing.  Exam shows mild nasal congestion and no evidence of an ear infection.  Throat clear.  Chest clear auscultation.  PCR strep negative.  Discussed with patient.  Viral URI.  Supportive care encouraged.  Advised to continue OTC decongestants, rest and fluids.  Reviewed return precautions.   Final Clinical Impressions(s) / UC Diagnoses   Final diagnoses:  Viral upper respiratory tract infection  Nasal congestion  Dysfunction of left eustachian tube     Discharge Instructions      URI/COLD SYMPTOMS: Your exam today is consistent with a viral illness. Antibiotics are not indicated at this time. Use medications as directed, including cough syrup, nasal saline, and decongestants. Your symptoms should improve over the next few days and  resolve within 7-10 days. Increase rest and fluids. F/u if symptoms worsen or predominate such as sore throat, ear pain, productive cough, shortness of breath, or if you develop high fevers or worsening fatigue over the next several days.       ED Prescriptions   None    PDMP not reviewed this encounter.   Laurene Footman B, PA-C 08/15/22 1210

## 2022-08-15 NOTE — Discharge Instructions (Signed)
URI/COLD SYMPTOMS: Your exam today is consistent with a viral illness. Antibiotics are not indicated at this time. Use medications as directed, including cough syrup, nasal saline, and decongestants. Your symptoms should improve over the next few days and resolve within 7-10 days. Increase rest and fluids. F/u if symptoms worsen or predominate such as sore throat, ear pain, productive cough, shortness of breath, or if you develop high fevers or worsening fatigue over the next several days.    

## 2022-08-15 NOTE — ED Triage Notes (Signed)
Pt c/o possible strep. Pt was around her son who tested positive for strep last night.  Pt has a pain behind her left ear and congestion and states that it is normal pain for her when she has strep x1week.

## 2022-08-21 ENCOUNTER — Encounter: Payer: 59 | Admitting: Physician Assistant

## 2022-08-21 NOTE — Progress Notes (Deleted)
    NURSE VISIT NOTE  Subjective:    Patient ID: Paula Sullivan, female    DOB: 03-Aug-1985, 37 y.o.   MRN: [id number]  HPI  Patient is a 37 y.o. [id number] female who presents for {pe vag discharge desc:315065} vaginal discharge for *** {gen duration:315003}. Denies abnormal vaginal bleeding or significant pelvic pain or fever. {Actions; denies/reports/admits to:19208} {UTI Symptoms:210800002}. Patient {has/denies:315300} history of known exposure to STD.   Objective:    LMP 08/08/2022    @THIS  VISIT ONLY@  Assessment:   No diagnosis found.  {vaginitis type:315262}  Plan:   GC and chlamydia DNA  probe sent to lab. Treatment: {vaginitis tx:315263} ROV prn if symptoms persist or worsen.   Quintella Baton, CMA

## 2022-08-22 ENCOUNTER — Ambulatory Visit: Payer: 59

## 2022-08-25 ENCOUNTER — Ambulatory Visit: Payer: 59 | Admitting: Physician Assistant

## 2022-08-27 ENCOUNTER — Encounter: Payer: Self-pay | Admitting: Physician Assistant

## 2022-08-28 ENCOUNTER — Ambulatory Visit: Payer: 59 | Admitting: Physician Assistant

## 2022-09-01 ENCOUNTER — Telehealth: Payer: Self-pay | Admitting: Physician Assistant

## 2022-09-01 ENCOUNTER — Ambulatory Visit (INDEPENDENT_AMBULATORY_CARE_PROVIDER_SITE_OTHER): Payer: 59 | Admitting: Physician Assistant

## 2022-09-01 ENCOUNTER — Encounter: Payer: Self-pay | Admitting: Physician Assistant

## 2022-09-01 VITALS — BP 122/80 | HR 91 | Temp 98.3°F | Resp 16 | Ht 63.0 in | Wt 174.4 lb

## 2022-09-01 DIAGNOSIS — N390 Urinary tract infection, site not specified: Secondary | ICD-10-CM | POA: Diagnosis not present

## 2022-09-01 DIAGNOSIS — F988 Other specified behavioral and emotional disorders with onset usually occurring in childhood and adolescence: Secondary | ICD-10-CM

## 2022-09-01 DIAGNOSIS — R300 Dysuria: Secondary | ICD-10-CM

## 2022-09-01 DIAGNOSIS — R5383 Other fatigue: Secondary | ICD-10-CM

## 2022-09-01 DIAGNOSIS — Z79899 Other long term (current) drug therapy: Secondary | ICD-10-CM

## 2022-09-01 DIAGNOSIS — R140 Abdominal distension (gaseous): Secondary | ICD-10-CM

## 2022-09-01 DIAGNOSIS — E538 Deficiency of other specified B group vitamins: Secondary | ICD-10-CM | POA: Diagnosis not present

## 2022-09-01 DIAGNOSIS — E782 Mixed hyperlipidemia: Secondary | ICD-10-CM

## 2022-09-01 DIAGNOSIS — E559 Vitamin D deficiency, unspecified: Secondary | ICD-10-CM

## 2022-09-01 DIAGNOSIS — T63481A Toxic effect of venom of other arthropod, accidental (unintentional), initial encounter: Secondary | ICD-10-CM

## 2022-09-01 LAB — POCT URINE DRUG SCREEN
Methylenedioxyamphetamine: NOT DETECTED
POC Amphetamine UR: POSITIVE — AB
POC BENZODIAZEPINES UR: NOT DETECTED
POC Barbiturate UR: NOT DETECTED
POC Cocaine UR: NOT DETECTED
POC Ecstasy UR: NOT DETECTED
POC Marijuana UR: NOT DETECTED
POC Methadone UR: NOT DETECTED
POC Methamphetamine UR: NOT DETECTED
POC Opiate Ur: NOT DETECTED
POC Oxycodone UR: NOT DETECTED
POC PHENCYCLIDINE UR: NOT DETECTED
POC TRICYCLICS UR: NOT DETECTED

## 2022-09-01 LAB — POCT URINALYSIS DIPSTICK
Bilirubin, UA: NEGATIVE
Blood, UA: NEGATIVE
Glucose, UA: NEGATIVE
Ketones, UA: NEGATIVE
Nitrite, UA: NEGATIVE
Protein, UA: NEGATIVE
Spec Grav, UA: 1.01 (ref 1.010–1.025)
Urobilinogen, UA: 0.2 E.U./dL
pH, UA: 6.5 (ref 5.0–8.0)

## 2022-09-01 MED ORDER — AMPHETAMINE-DEXTROAMPHETAMINE 20 MG PO TABS: 20.0000 mg | ORAL_TABLET | Freq: Two times a day (BID) | ORAL | 0 refills | Status: DC

## 2022-09-01 MED ORDER — OMEPRAZOLE 40 MG PO CPDR: 40.0000 mg | DELAYED_RELEASE_CAPSULE | Freq: Every day | ORAL | 3 refills | Status: DC

## 2022-09-01 NOTE — Progress Notes (Signed)
[facility] [place], [place] [address]  Internal MEDICINE  Office Visit Note  Patient Name: Paula Sullivan  [id number]  SD:[id number]  Date of Service: 09/04/2022  Chief Complaint  Patient presents with   Follow-up   Medication Refill    Adderall    HPI Pt is here for routine follow up -Some bloating and fluid retention recently.  -Some nausea and gas pains after she eats -Not eating anything new. -Doing well with with adderall '20mg'$  BID, no side effects -magnesium is helping with sleep now and stopped trazodone. Did also stop lexparo and has been doing ok -Due for routine labs and will order now -Urine does show signs of UTI, but does not complain of incontinence or burning. -Would like new referral to allergist as she had to cancel previous appt. 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 May also be interested in further allergy testing to see if this could also be affecting GI symptoms as well  Current Medication: Outpatient Encounter Medications as of 09/01/2022  Medication Sig   cetirizine (ZYRTEC) 10 MG tablet Take 1 tablet (10 mg total) by mouth daily.   omeprazole (PRILOSEC) 40 MG capsule Take 1 capsule (40 mg total) by mouth daily.   [DISCONTINUED] amoxicillin-clavulanate (AUGMENTIN) 875-125 MG tablet Take 1 tablet by mouth 2 (two) times daily. Take with food   [DISCONTINUED] amphetamine-dextroamphetamine (ADDERALL) 20 MG tablet Take 1 tablet (20 mg total) by mouth 2 (two) times daily.   [DISCONTINUED] amphetamine-dextroamphetamine (ADDERALL) 20 MG tablet Take 1 tablet (20 mg total) by mouth 2 (two) times daily.   [DISCONTINUED] amphetamine-dextroamphetamine (ADDERALL) 20 MG tablet Take 1 tablet (20 mg total) by mouth 2 (two) times daily.   [DISCONTINUED] azithromycin (ZITHROMAX) 250 MG tablet Use as directed for 5 days   [DISCONTINUED] escitalopram (LEXAPRO) 10 MG tablet Take 1 tablet (10 mg total) by mouth daily.    [DISCONTINUED] traZODone (DESYREL) 100 MG tablet Take 0.5-2 tablets (50-200 mg total) by mouth at bedtime.   amphetamine-dextroamphetamine (ADDERALL) 20 MG tablet Take 1 tablet (20 mg total) by mouth 2 (two) times daily.   [START ON 09/29/2022] amphetamine-dextroamphetamine (ADDERALL) 20 MG tablet Take 1 tablet (20 mg total) by mouth 2 (two) times daily.   [START ON 10/29/2022] amphetamine-dextroamphetamine (ADDERALL) 20 MG tablet Take 1 tablet (20 mg total) by mouth 2 (two) times daily.   [DISCONTINUED] norgestimate-ethinyl estradiol (ORTHO-CYCLEN) 0.25-35 MG-MCG tablet Take 1 tablet by mouth daily.   No facility-administered encounter medications on file as of 09/01/2022.    Surgical History: Past Surgical History:  Procedure Laterality Date   CHOLECYSTECTOMY     GALLBLADDER SURGERY  01/2005   LAPAROSCOPIC BILATERAL SALPINGECTOMY Bilateral 06/13/2021   Procedure: LAPAROSCOPIC BILATERAL SALPINGECTOMY;  Surgeon: Gae Dry, MD;  Location: [facility] ORS;  Service: Gynecology;  Laterality: Bilateral;   SLEEVE GASTROPLASTY      Medical History: Past Medical History:  Diagnosis Date   ADHD    Anemia    PONV (postoperative nausea and vomiting)    Type AB blood, Rh negative     Family History: Family History  Problem Relation Age of Onset   Suicidality Father        2011   Diabetes Maternal Grandfather    Cancer Mother 56       hyst--? cervical    Social History   Socioeconomic History   Marital status: Single    Spouse name: Not on file   Number  of children: 1   Years of education: Not on file   Highest education level: Not on file  Occupational History   Not on file  Tobacco Use   Smoking status: Never   Smokeless tobacco: Never  Vaping Use   Vaping Use: Never used  Substance and Sexual Activity   Alcohol use: Not Currently    Comment: Rarely   Drug use: No   Sexual activity: Yes    Birth control/protection: None  Other Topics Concern   Not on file  Social  History Narrative   Not on file   Social Determinants of Health   Financial Resource Strain: Not on file  Food Insecurity: Not on file  Transportation Needs: Not on file  Physical Activity: Not on file  Stress: Not on file  Social Connections: Not on file  Intimate Partner Violence: Not on file      Review of Systems  Constitutional:  Negative for chills, fatigue and unexpected weight change.  HENT:  Positive for postnasal drip. Negative for congestion, rhinorrhea, sneezing and sore throat.   Eyes:  Negative for redness.  Respiratory:  Negative for cough, chest tightness and shortness of breath.   Cardiovascular:  Negative for chest pain and palpitations.  Gastrointestinal:  Positive for abdominal distention and nausea. Negative for abdominal pain, constipation, diarrhea and vomiting.  Genitourinary:  Negative for dysuria and frequency.  Musculoskeletal:  Negative for arthralgias, back pain, joint swelling and neck pain.  Skin:  Negative for rash.  Neurological: Negative.  Negative for tremors and numbness.  Hematological:  Negative for adenopathy. Does not bruise/bleed easily.  Psychiatric/Behavioral:  Negative for behavioral problems (Depression), sleep disturbance and suicidal ideas. The patient is not nervous/anxious.     Vital Signs: BP 122/80   Pulse 91   Temp 98.3 F (36.8 C)   Resp 16   Ht '5\' 3"'$  (1.6 m)   Wt 174 lb 6.4 oz (79.1 kg)   LMP 08/08/2022   SpO2 98%   BMI 30.89 kg/m    Physical Exam Vitals reviewed.  Constitutional:      General: She is not in acute distress.    Appearance: Normal appearance. She is not ill-appearing.  HENT:     Head: Normocephalic and atraumatic.  Eyes:     Pupils: Pupils are equal, round, and reactive to light.  Cardiovascular:     Rate and Rhythm: Normal rate and regular rhythm.  Pulmonary:     Effort: Pulmonary effort is normal. No respiratory distress.  Abdominal:     General: There is no distension.     Tenderness:  There is no abdominal tenderness. There is no guarding.     Hernia: No hernia is present.  Musculoskeletal:        General: Normal range of motion.  Skin:    General: Skin is warm and dry.  Neurological:     Mental Status: She is alert and oriented to person, place, and time.  Psychiatric:        Mood and Affect: Mood normal.        Behavior: Behavior normal.        Assessment/Plan: 1. Attention deficit disorder (ADD) in adult May continue adderall as needed as before - amphetamine-dextroamphetamine (ADDERALL) 20 MG tablet; Take 1 tablet (20 mg total) by mouth 2 (two) times daily.  Dispense: 60 tablet; Refill: 0 - amphetamine-dextroamphetamine (ADDERALL) 20 MG tablet; Take 1 tablet (20 mg total) by mouth 2 (two) times daily.  Dispense: 60 tablet;  Refill: 0 - amphetamine-dextroamphetamine (ADDERALL) 20 MG tablet; Take 1 tablet (20 mg total) by mouth 2 (two) times daily.  Dispense: 60 tablet; Refill: 0 [place] Controlled Substance Database was reviewed by me for overdose risk score (ORS) Refilled Controlled medications today. Reviewed risks and possible side effects associated with taking Stimulants. Combination of these drugs with other psychotropic medications could cause dizziness and drowsiness. Pt needs to Monitor symptoms and exercise caution in driving and operating heavy machinery to avoid damages to oneself, to others and to the surroundings. Patient verbalized understanding in this matter. Dependence and abuse for these drugs will be monitored closely. A Controlled substance policy and procedure is on file which allows [facility] to order a urine drug screen test at any visit. Patient understands and agrees with the plan..  2. Abdominal bloating Will start on PPI and she will monitor if any correlation with foods. Check labs.  - CBC w/Diff/Platelet - Comprehensive metabolic panel - Fe+TIBC+Fer - omeprazole (PRILOSEC) 40 MG capsule; Take 1 capsule (40 mg total) by mouth  daily.  Dispense: 30 capsule; Refill: 3 - Ambulatory referral to Allergy  3. Urinary tract infection without hematuria, site unspecified - CULTURE, URINE COMPREHENSIVE  4. Allergic reaction to insect sting, accidental or unintentional, initial encounter - Ambulatory referral to Allergy  5. B12 deficiency - B12 and Folate Panel  6. Vitamin D deficiency - VITAMIN D 25 Hydroxy (Vit-D Deficiency, Fractures)  7. Mixed hyperlipidemia - Lipid Panel With LDL/HDL Ratio  8. Other fatigue - CBC w/Diff/Platelet - Comprehensive metabolic panel - Fe+TIBC+Fer - TSH + free T4 - Lipid Panel With LDL/HDL Ratio - B12 and Folate Panel - VITAMIN D 25 Hydroxy (Vit-D Deficiency, Fractures)  9. Dysuria - POCT Urinalysis Dipstick  10. Encounter for long-term (current) use of medications - POCT Urine Drug Screen   General Counseling: Seraphim verbalizes understanding of the findings of todays visit and agrees with plan of treatment. I have discussed any further diagnostic evaluation that may be needed or ordered today. We also reviewed her medications today. she has been encouraged to call the office with any questions or concerns that should arise related to todays visit.    Orders Placed This Encounter  Procedures   CULTURE, URINE COMPREHENSIVE   CBC w/Diff/Platelet   Comprehensive metabolic panel   Fe+TIBC+Fer   TSH + free T4   Lipid Panel With LDL/HDL Ratio   B12 and Folate Panel   VITAMIN D 25 Hydroxy (Vit-D Deficiency, Fractures)   Ambulatory referral to Allergy   POCT Urine Drug Screen   POCT Urinalysis Dipstick    Meds ordered this encounter  Medications   omeprazole (PRILOSEC) 40 MG capsule    Sig: Take 1 capsule (40 mg total) by mouth daily.    Dispense:  30 capsule    Refill:  3   amphetamine-dextroamphetamine (ADDERALL) 20 MG tablet    Sig: Take 1 tablet (20 mg total) by mouth 2 (two) times daily.    Dispense:  60 tablet    Refill:  0    Fill for december    amphetamine-dextroamphetamine (ADDERALL) 20 MG tablet    Sig: Take 1 tablet (20 mg total) by mouth 2 (two) times daily.    Dispense:  60 tablet    Refill:  0   amphetamine-dextroamphetamine (ADDERALL) 20 MG tablet    Sig: Take 1 tablet (20 mg total) by mouth 2 (two) times daily.    Dispense:  60 tablet    Refill:  0    This patient was seen by Drema Dallas, PA-C in collaboration with Dr. Clayborn Bigness as a part of collaborative care agreement.   Total time spent:35 Minutes Time spent includes review of chart, medications, test results, and follow up plan with the patient.      Dr Lavera Guise Internal medicine

## 2022-09-01 NOTE — Telephone Encounter (Signed)
Awaiting 09/01/22 office notes for Allergy referral-[name]

## 2022-09-03 LAB — IRON,TIBC AND FERRITIN PANEL
Ferritin: 50 ng/mL (ref 15–150)
Iron Saturation: 34 % (ref 15–55)
Iron: 105 ug/dL (ref 27–159)
Total Iron Binding Capacity: 309 ug/dL (ref 250–450)
UIBC: 204 ug/dL (ref 131–425)

## 2022-09-03 LAB — COMPREHENSIVE METABOLIC PANEL
ALT: 25 IU/L (ref 0–32)
AST: 22 IU/L (ref 0–40)
Albumin/Globulin Ratio: 1.8 (ref 1.2–2.2)
Albumin: 4.6 g/dL (ref 3.9–4.9)
Alkaline Phosphatase: 71 IU/L (ref 44–121)
BUN/Creatinine Ratio: 14 (ref 9–23)
BUN: 12 mg/dL (ref 6–20)
Bilirubin Total: 0.7 mg/dL (ref 0.0–1.2)
CO2: 24 mmol/L (ref 20–29)
Calcium: 9.3 mg/dL (ref 8.7–10.2)
Chloride: 102 mmol/L (ref 96–106)
Creatinine, Ser: 0.85 mg/dL (ref 0.57–1.00)
Globulin, Total: 2.6 g/dL (ref 1.5–4.5)
Glucose: 90 mg/dL (ref 70–99)
Potassium: 4.3 mmol/L (ref 3.5–5.2)
Sodium: 139 mmol/L (ref 134–144)
Total Protein: 7.2 g/dL (ref 6.0–8.5)
eGFR: 90 mL/min/{1.73_m2} (ref >59)

## 2022-09-03 LAB — CBC WITH DIFFERENTIAL/PLATELET
Basophils Absolute: 0 10*3/uL (ref 0.0–0.2)
Basos: 1 %
EOS (ABSOLUTE): 0.2 10*3/uL (ref 0.0–0.4)
Eos: 3 %
Hematocrit: 43.1 % (ref 34.0–46.6)
Hemoglobin: 14.1 g/dL (ref 11.1–15.9)
Immature Grans (Abs): 0 10*3/uL (ref 0.0–0.1)
Immature Granulocytes: 0 %
Lymphocytes Absolute: 1.2 10*3/uL (ref 0.7–3.1)
Lymphs: 22 %
MCH: 29.8 pg (ref 26.6–33.0)
MCHC: 32.7 g/dL (ref 31.5–35.7)
MCV: 91 fL (ref 79–97)
Monocytes Absolute: 0.4 10*3/uL (ref 0.1–0.9)
Monocytes: 7 %
Neutrophils Absolute: 3.6 10*3/uL (ref 1.4–7.0)
Neutrophils: 67 %
Platelets: 213 10*3/uL (ref 150–450)
RBC: 4.73 x10E6/uL (ref 3.77–5.28)
RDW: 12.3 % (ref 11.7–15.4)
WBC: 5.4 10*3/uL (ref 3.4–10.8)

## 2022-09-03 LAB — B12 AND FOLATE PANEL
Folate: 10.9 ng/mL (ref >3.0)
Vitamin B-12: 293 pg/mL (ref 232–1245)

## 2022-09-03 LAB — TSH+FREE T4
Free T4: 1.08 ng/dL (ref 0.82–1.77)
TSH: 2.23 u[IU]/mL (ref 0.450–4.500)

## 2022-09-03 LAB — LIPID PANEL WITH LDL/HDL RATIO
Cholesterol, Total: 184 mg/dL (ref 100–199)
HDL: 53 mg/dL (ref >39)
LDL Chol Calc (NIH): 119 mg/dL — ABNORMAL HIGH (ref 0–99)
LDL/HDL Ratio: 2.2 ratio (ref 0.0–3.2)
Triglycerides: 64 mg/dL (ref 0–149)
VLDL Cholesterol Cal: 12 mg/dL (ref 5–40)

## 2022-09-03 LAB — VITAMIN D 25 HYDROXY (VIT D DEFICIENCY, FRACTURES): Vit D, 25-Hydroxy: 27.9 ng/mL — ABNORMAL LOW (ref 30.0–100.0)

## 2022-09-04 LAB — CULTURE, URINE COMPREHENSIVE

## 2022-09-05 ENCOUNTER — Telehealth: Payer: Self-pay

## 2022-09-05 NOTE — Telephone Encounter (Signed)
Pt notified for labs and advised her take OTC vitamin D 2000 iu Daily and gave nubi for appt for b12 shots

## 2022-09-05 NOTE — Telephone Encounter (Signed)
-----   Message from Mylinda Latina, PA-C sent at 09/05/2022  1:19 PM EST ----- Please let her know that urine without significant growth--no tx needed. Needs b12 shots and OTC Vit D supplement

## 2022-09-08 ENCOUNTER — Telehealth: Payer: Self-pay | Admitting: Physician Assistant

## 2022-09-08 ENCOUNTER — Ambulatory Visit (INDEPENDENT_AMBULATORY_CARE_PROVIDER_SITE_OTHER): Payer: 59

## 2022-09-08 DIAGNOSIS — E538 Deficiency of other specified B group vitamins: Secondary | ICD-10-CM | POA: Diagnosis not present

## 2022-09-08 MED ORDER — CYANOCOBALAMIN 1000 MCG/ML IJ SOLN
1000.0000 ug | Freq: Once | INTRAMUSCULAR | Status: AC
  Administered 2022-09-08: 1000 ug via INTRAMUSCULAR

## 2022-09-08 NOTE — Telephone Encounter (Signed)
Allergy referral faxed [place] ; [phone]

## 2022-09-10 ENCOUNTER — Encounter: Payer: Self-pay | Admitting: Physician Assistant

## 2022-09-10 ENCOUNTER — Other Ambulatory Visit: Payer: Self-pay

## 2022-09-10 MED ORDER — VITAMIN D 25 MCG (1000 UNIT) PO TABS: 1000.0000 [IU] | ORAL_TABLET | Freq: Every day | ORAL | 1 refills | Status: DC

## 2022-09-15 ENCOUNTER — Ambulatory Visit (INDEPENDENT_AMBULATORY_CARE_PROVIDER_SITE_OTHER): Payer: 59

## 2022-09-15 DIAGNOSIS — E538 Deficiency of other specified B group vitamins: Secondary | ICD-10-CM | POA: Diagnosis not present

## 2022-09-15 MED ORDER — CYANOCOBALAMIN 1000 MCG/ML IJ SOLN
1000.0000 ug | Freq: Once | INTRAMUSCULAR | Status: AC
  Administered 2022-09-15: 1000 ug via INTRAMUSCULAR

## 2022-09-22 ENCOUNTER — Ambulatory Visit (INDEPENDENT_AMBULATORY_CARE_PROVIDER_SITE_OTHER): Payer: 59

## 2022-09-22 DIAGNOSIS — E538 Deficiency of other specified B group vitamins: Secondary | ICD-10-CM

## 2022-09-22 MED ORDER — CYANOCOBALAMIN 1000 MCG/ML IJ SOLN
1000.0000 ug | Freq: Once | INTRAMUSCULAR | Status: AC
  Administered 2022-09-22: 1000 ug via INTRAMUSCULAR

## 2022-09-25 ENCOUNTER — Encounter: Payer: Self-pay | Admitting: Physician Assistant

## 2022-09-25 ENCOUNTER — Ambulatory Visit (INDEPENDENT_AMBULATORY_CARE_PROVIDER_SITE_OTHER): Payer: Medicaid Other | Admitting: Physician Assistant

## 2022-09-25 VITALS — BP 111/80 | HR 88 | Temp 98.3°F | Resp 16 | Ht 63.0 in | Wt 171.0 lb

## 2022-09-25 DIAGNOSIS — R110 Nausea: Secondary | ICD-10-CM

## 2022-09-25 DIAGNOSIS — Z903 Acquired absence of stomach [part of]: Secondary | ICD-10-CM | POA: Diagnosis not present

## 2022-09-25 DIAGNOSIS — R140 Abdominal distension (gaseous): Secondary | ICD-10-CM | POA: Diagnosis not present

## 2022-09-25 DIAGNOSIS — Z0001 Encounter for general adult medical examination with abnormal findings: Secondary | ICD-10-CM | POA: Diagnosis not present

## 2022-09-25 DIAGNOSIS — E559 Vitamin D deficiency, unspecified: Secondary | ICD-10-CM

## 2022-09-25 DIAGNOSIS — R300 Dysuria: Secondary | ICD-10-CM

## 2022-09-25 DIAGNOSIS — N390 Urinary tract infection, site not specified: Secondary | ICD-10-CM

## 2022-09-25 LAB — POCT URINALYSIS DIPSTICK
Blood, UA: NEGATIVE
Glucose, UA: NEGATIVE
Nitrite, UA: NEGATIVE
Protein, UA: NEGATIVE
Spec Grav, UA: 1.01 (ref 1.010–1.025)
Urobilinogen, UA: 0.2 E.U./dL
pH, UA: 5 (ref 5.0–8.0)

## 2022-09-25 MED ORDER — CIPROFLOXACIN HCL 500 MG PO TABS: 500.0000 mg | ORAL_TABLET | Freq: Two times a day (BID) | ORAL | 0 refills | Status: AC

## 2022-09-25 NOTE — Progress Notes (Signed)
[facility] [place], [place] [address]  Internal MEDICINE  Office Visit Note  Patient Name: Paula Sullivan  [id number]  SD:[id number]  Date of Service: 10/01/2022  Chief Complaint  Patient presents with   Annual Exam   Abdominal Pain   Urinary Tract Infection     HPI Pt is here for routine health maintenance examination -Increased her vit D to 3000IU, but started having headaches, wil go back to 2000 instead -has been doing B12 injections -Still feeling very tired, everything she eats makes her nausueous if eating solid food, no problem with liquids. Still feels bloated and abdominal discomfort. She understands to call or go to ED if any acute changes or worsening, but is interested in GI referral. -BM every couple days, no changes with this. Takes stool softener. No consistency or color changes -[place] and has not had follow up for this -Does have some burning with urination now as well and will go ahead and treat for possible UTI -Going to be seeing the allergist -Her LDL continues to be elevated and she is going to start on a fish oil supplement  Current Medication: Outpatient Encounter Medications as of 09/25/2022  Medication Sig   amphetamine-dextroamphetamine (ADDERALL) 20 MG tablet Take 1 tablet (20 mg total) by mouth 2 (two) times daily.   amphetamine-dextroamphetamine (ADDERALL) 20 MG tablet Take 1 tablet (20 mg total) by mouth 2 (two) times daily.   [START ON 10/29/2022] amphetamine-dextroamphetamine (ADDERALL) 20 MG tablet Take 1 tablet (20 mg total) by mouth 2 (two) times daily.   cetirizine (ZYRTEC) 10 MG tablet Take 1 tablet (10 mg total) by mouth daily.   cholecalciferol (VITAMIN D3) 25 MCG (1000 UNIT) tablet Take 1 tablet (1,000 Units total) by mouth daily.   ciprofloxacin (CIPRO) 500 MG tablet Take 1 tablet (500 mg total) by mouth 2 (two) times daily for 10 days.   omeprazole (PRILOSEC) 40 MG capsule Take  1 capsule (40 mg total) by mouth daily.   [DISCONTINUED] norgestimate-ethinyl estradiol (ORTHO-CYCLEN) 0.25-35 MG-MCG tablet Take 1 tablet by mouth daily.   No facility-administered encounter medications on file as of 09/25/2022.    Surgical History: Past Surgical History:  Procedure Laterality Date   CHOLECYSTECTOMY     GALLBLADDER SURGERY  01/2005   LAPAROSCOPIC BILATERAL SALPINGECTOMY Bilateral 06/13/2021   Procedure: LAPAROSCOPIC BILATERAL SALPINGECTOMY;  Surgeon: Gae Dry, MD;  Location: [facility] ORS;  Service: Gynecology;  Laterality: Bilateral;   SLEEVE GASTROPLASTY      Medical History: Past Medical History:  Diagnosis Date   ADHD    Anemia    PONV (postoperative nausea and vomiting)    Type AB blood, Rh negative     Family History: Family History  Problem Relation Age of Onset   Suicidality Father        2011   Diabetes Maternal Grandfather    Cancer Mother 63       hyst--? cervical      Review of Systems  Constitutional:  Negative for chills, fatigue and unexpected weight change.  HENT:  Positive for postnasal drip. Negative for congestion, rhinorrhea, sneezing and sore throat.   Eyes:  Negative for redness.  Respiratory:  Negative for cough, chest tightness and shortness of breath.   Cardiovascular:  Negative for chest pain and palpitations.  Gastrointestinal:  Positive for abdominal distention and nausea. Negative for abdominal pain, constipation, diarrhea and vomiting.  Genitourinary:  Negative for dysuria and frequency.  Musculoskeletal:  Negative for arthralgias, back pain, joint swelling and neck pain.  Skin:  Negative for rash.  Neurological: Negative.  Negative for tremors and numbness.  Hematological:  Negative for adenopathy. Does not bruise/bleed easily.  Psychiatric/Behavioral:  Negative for behavioral problems (Depression), sleep disturbance and suicidal ideas. The patient is not nervous/anxious.      Vital Signs: BP 111/80   Pulse 88    Temp 98.3 F (36.8 C)   Resp 16   Ht 5\' 3"  (1.6 m)   Wt 171 lb (77.6 kg)   SpO2 98%   BMI 30.29 kg/m    Physical Exam Vitals reviewed.  Constitutional:      General: She is not in acute distress.    Appearance: Normal appearance. She is not ill-appearing.  HENT:     Head: Normocephalic and atraumatic.  Eyes:     Pupils: Pupils are equal, round, and reactive to light.  Cardiovascular:     Rate and Rhythm: Normal rate and regular rhythm.  Pulmonary:     Effort: Pulmonary effort is normal. No respiratory distress.  Abdominal:     General: There is no distension.     Tenderness: There is no abdominal tenderness. There is no guarding.     Hernia: No hernia is present.  Musculoskeletal:        General: Normal range of motion.  Skin:    General: Skin is warm and dry.  Neurological:     Mental Status: She is alert and oriented to person, place, and time.  Psychiatric:        Mood and Affect: Mood normal.        Behavior: Behavior normal.      LABS: Recent Results (from the past 2160 hour(s))  Group A Strep by PCR     Status: None   Collection Time: 08/15/22 11:22 AM   Specimen: Throat; Sterile Swab  Result Value Ref Range   Group A Strep by PCR NOT DETECTED NOT DETECTED    Comment: Performed at [facility], [address] [place], [place] [address]  POCT Urine Drug Screen     Status: Abnormal   Collection Time: 09/01/22  1:29 PM  Result Value Ref Range   POC Methamphetamine UR None Detected None Detected   POC Opiate Ur None Detected None Detected   POC Barbiturate UR None Detected None Detected   POC Amphetamine UR Positive (A) None Detected   POC Oxycodone UR None Detected None Detected   POC Cocaine UR None Detected None Detected   POC Ecstasy UR None Detected None Detected   POC TRICYCLICS UR None Detected None Detected   POC PHENCYCLIDINE UR None Detected None Detected   POC Marijuana UR None Detected None Detected   POC Methadone UR None  Detected None Detected   POC BENZODIAZEPINES UR None Detected None Detected   URINE TEMPERATURE     POC DRUG SCREEN OXIDANTS URINE     POC SPECIFIC GRAVITY URINE     POC PH URINE     Methylenedioxyamphetamine None Detected None Detected  POCT Urinalysis Dipstick     Status: Abnormal   Collection Time: 09/01/22  1:55 PM  Result Value Ref Range   Color, UA     Clarity, UA     Glucose, UA Negative Negative   Bilirubin, UA Negative    Ketones, UA Negative    Spec Grav, UA 1.010 1.010 - 1.025   Blood, UA Negative    pH,  UA 6.5 5.0 - 8.0   Protein, UA Negative Negative   Urobilinogen, UA 0.2 0.2 or 1.0 E.U./dL   Nitrite, UA Negative    Leukocytes, UA Large (3+) (A) Negative   Appearance     Odor    CULTURE, URINE COMPREHENSIVE     Status: None   Collection Time: 09/01/22  2:46 PM   Specimen: Urine   Urine  Result Value Ref Range   Urine Culture, Comprehensive Final report    Organism ID, Bacteria Comment     Comment: Mixed urogenital flora 6,000  Colonies/mL   CBC w/Diff/Platelet     Status: None   Collection Time: 09/02/22  8:45 AM  Result Value Ref Range   WBC 5.4 3.4 - 10.8 x10E3/uL   RBC 4.73 3.77 - 5.28 x10E6/uL   Hemoglobin 14.1 11.1 - 15.9 g/dL   Hematocrit 43.1 34.0 - 46.6 %   MCV 91 79 - 97 fL   MCH 29.8 26.6 - 33.0 pg   MCHC 32.7 31.5 - 35.7 g/dL   RDW 12.3 11.7 - 15.4 %   Platelets 213 150 - 450 x10E3/uL   Neutrophils 67 Not Estab. %   Lymphs 22 Not Estab. %   Monocytes 7 Not Estab. %   Eos 3 Not Estab. %   Basos 1 Not Estab. %   Neutrophils Absolute 3.6 1.4 - 7.0 x10E3/uL   Lymphocytes Absolute 1.2 0.7 - 3.1 x10E3/uL   Monocytes Absolute 0.4 0.1 - 0.9 x10E3/uL   EOS (ABSOLUTE) 0.2 0.0 - 0.4 x10E3/uL   Basophils Absolute 0.0 0.0 - 0.2 x10E3/uL   Immature Granulocytes 0 Not Estab. %   Immature Grans (Abs) 0.0 0.0 - 0.1 x10E3/uL  Comprehensive metabolic panel     Status: None   Collection Time: 09/02/22  8:45 AM  Result Value Ref Range   Glucose 90 70 -  99 mg/dL   BUN 12 6 - 20 mg/dL   Creatinine, Ser 0.85 0.57 - 1.00 mg/dL   eGFR 90 >59 mL/min/1.73   BUN/Creatinine Ratio 14 9 - 23   Sodium 139 134 - 144 mmol/L   Potassium 4.3 3.5 - 5.2 mmol/L   Chloride 102 96 - 106 mmol/L   CO2 24 20 - 29 mmol/L   Calcium 9.3 8.7 - 10.2 mg/dL   Total Protein 7.2 6.0 - 8.5 g/dL   Albumin 4.6 3.9 - 4.9 g/dL   Globulin, Total 2.6 1.5 - 4.5 g/dL   Albumin/Globulin Ratio 1.8 1.2 - 2.2   Bilirubin Total 0.7 0.0 - 1.2 mg/dL   Alkaline Phosphatase 71 44 - 121 IU/L   AST 22 0 - 40 IU/L   ALT 25 0 - 32 IU/L  Fe+TIBC+Fer     Status: None   Collection Time: 09/02/22  8:45 AM  Result Value Ref Range   Total Iron Binding Capacity 309 250 - 450 ug/dL   UIBC 204 131 - 425 ug/dL   Iron 105 27 - 159 ug/dL   Iron Saturation 34 15 - 55 %   Ferritin 50 15 - 150 ng/mL  TSH + free T4     Status: None   Collection Time: 09/02/22  8:45 AM  Result Value Ref Range   TSH 2.230 0.450 - 4.500 uIU/mL   Free T4 1.08 0.82 - 1.77 ng/dL  Lipid Panel With LDL/HDL Ratio     Status: Abnormal   Collection Time: 09/02/22  8:45 AM  Result Value Ref Range   Cholesterol, Total 184 100 -  199 mg/dL   Triglycerides 64 0 - 149 mg/dL   HDL 53 >39 mg/dL   VLDL Cholesterol Cal 12 5 - 40 mg/dL   LDL Chol Calc (NIH) 119 (H) 0 - 99 mg/dL   LDL/HDL Ratio 2.2 0.0 - 3.2 ratio    Comment:                                     LDL/HDL Ratio                                             Men  Women                               1/2 Avg.Risk  1.0    1.5                                   Avg.Risk  3.6    3.2                                2X Avg.Risk  6.2    5.0                                3X Avg.Risk  8.0    6.1   B12 and Folate Panel     Status: None   Collection Time: 09/02/22  8:45 AM  Result Value Ref Range   Vitamin B-12 293 232 - 1,245 pg/mL   Folate 10.9 >3.0 ng/mL    Comment: A serum folate concentration of less than 3.1 ng/mL is considered to represent clinical deficiency.    VITAMIN D 25 Hydroxy (Vit-D Deficiency, Fractures)     Status: Abnormal   Collection Time: 09/02/22  8:45 AM  Result Value Ref Range   Vit D, 25-Hydroxy 27.9 (L) 30.0 - 100.0 ng/mL    Comment: Vitamin D deficiency has been defined by the [place] practice guideline as a level of serum 25-OH vitamin D less than 20 ng/mL (1,2). The Endocrine Society went on to further define vitamin D insufficiency as a level between 21 and 29 ng/mL (2). 1. IOM (Institute of Medicine). 2010. Dietary reference    intakes for calcium and D. [place]: The    [employer]. 2. Holick MF, Binkley Cuney, Bischoff-Ferrari HA, et al.    Evaluation, treatment, and prevention of vitamin D    deficiency: an Endocrine Society clinical practice    guideline. JCEM. 2011 Jul; 96(7):1911-30.   POCT Urinalysis Dipstick     Status: Abnormal   Collection Time: 09/25/22 11:19 AM  Result Value Ref Range   Color, UA     Clarity, UA     Glucose, UA Negative Negative   Bilirubin, UA Large    Ketones, UA Trace    Spec Grav, UA 1.010 1.010 - 1.025   Blood, UA Negative    pH, UA 5.0 5.0 - 8.0   Protein, UA Negative Negative   Urobilinogen, UA 0.2 0.2 or 1.0 E.U./dL  Nitrite, UA Negative    Leukocytes, UA Large (3+) (A) Negative   Appearance     Odor    CULTURE, URINE COMPREHENSIVE     Status: None   Collection Time: 09/25/22  4:02 PM   Specimen: Urine   Urine  Result Value Ref Range   Urine Culture, Comprehensive Final report    Organism ID, Bacteria Comment     Comment: Mixed urogenital flora 1,000 Colonies/mL         Assessment/Plan: 1. Encounter for general adult medical examination with abnormal findings CPE performed, labs reviewed, UTD on PHM  2. Abdominal bloating Will treat with cipro for possible UTI with possible GI coverage as well. Will refer to GI for further evaluation. Advised to call or go to ED if acute worsening. - Ambulatory referral  to Gastroenterology  3. S/P gastric sleeve procedure Will refer to GI - Ambulatory referral to Gastroenterology  4. Nauseous Will refer to GI - Ambulatory referral to Gastroenterology  5. Vitamin D deficiency Will adjust vitamin D supplement  6. Urinary tract infection without hematuria, site unspecified Will treat with cipro and adjust based on C/S - CULTURE, URINE COMPREHENSIVE - ciprofloxacin (CIPRO) 500 MG tablet; Take 1 tablet (500 mg total) by mouth 2 (two) times daily for 10 days.  Dispense: 20 tablet; Refill: 0  7. Dysuria - POCT Urinalysis Dipstick   General Counseling: Paulena verbalizes understanding of the findings of todays visit and agrees with plan of treatment. I have discussed any further diagnostic evaluation that may be needed or ordered today. We also reviewed her medications today. she has been encouraged to call the office with any questions or concerns that should arise related to todays visit.    Counseling:    Orders Placed This Encounter  Procedures   CULTURE, URINE COMPREHENSIVE   Ambulatory referral to Gastroenterology   POCT Urinalysis Dipstick    Meds ordered this encounter  Medications   ciprofloxacin (CIPRO) 500 MG tablet    Sig: Take 1 tablet (500 mg total) by mouth 2 (two) times daily for 10 days.    Dispense:  20 tablet    Refill:  0    This patient was seen by Drema Dallas, PA-C in collaboration with Dr. Clayborn Bigness as a part of collaborative care agreement.  Total time spent:35 Minutes  Time spent includes review of chart, medications, test results, and follow up plan with the patient.     Lavera Guise, MD  Internal Medicine

## 2022-09-30 ENCOUNTER — Telehealth: Payer: Self-pay

## 2022-09-30 LAB — CULTURE, URINE COMPREHENSIVE

## 2022-09-30 NOTE — Telephone Encounter (Signed)
-----   Message from Mylinda Latina, PA-C sent at 09/30/2022  1:12 PM EDT ----- Please let her know that her urine culture only showed mixed flora which doesn't require treatment, however if ABX is helping abdominal symptoms then may complete course.

## 2022-09-30 NOTE — Telephone Encounter (Signed)
Left message for patient regarding urine culture results. 

## 2022-10-09 ENCOUNTER — Other Ambulatory Visit: Payer: Self-pay

## 2022-10-09 ENCOUNTER — Encounter: Payer: Self-pay | Admitting: Physician Assistant

## 2022-10-09 MED ORDER — AZITHROMYCIN 250 MG PO TABS: ORAL_TABLET | ORAL | 0 refills | Status: DC

## 2022-10-09 NOTE — Telephone Encounter (Signed)
Spoke with she is having sinus infection as per lauren send zpak

## 2022-10-20 ENCOUNTER — Ambulatory Visit (INDEPENDENT_AMBULATORY_CARE_PROVIDER_SITE_OTHER): Payer: Medicaid Other

## 2022-10-20 DIAGNOSIS — E538 Deficiency of other specified B group vitamins: Secondary | ICD-10-CM

## 2022-10-20 MED ORDER — CYANOCOBALAMIN 1000 MCG/ML IJ SOLN
1000.0000 ug | Freq: Once | INTRAMUSCULAR | Status: AC
  Administered 2022-10-20: 1000 ug via INTRAMUSCULAR

## 2022-10-21 ENCOUNTER — Ambulatory Visit: Payer: Medicaid Other | Admitting: Obstetrics and Gynecology

## 2022-10-21 ENCOUNTER — Telehealth: Payer: Self-pay

## 2022-10-21 DIAGNOSIS — B9689 Other specified bacterial agents as the cause of diseases classified elsewhere: Secondary | ICD-10-CM

## 2022-10-21 MED ORDER — METRONIDAZOLE 0.75 % VA GEL: 1.0000 | Freq: Every day | VAGINAL | 0 refills | Status: DC

## 2022-10-21 NOTE — Telephone Encounter (Signed)
Pt is having her usual bv sx, can something be called in? Per ABC, send metro vag gel Rx.

## 2022-11-06 ENCOUNTER — Ambulatory Visit: Payer: Self-pay

## 2022-11-06 ENCOUNTER — Encounter: Payer: Self-pay | Admitting: Nurse Practitioner

## 2022-11-06 ENCOUNTER — Encounter: Payer: Self-pay | Admitting: Physician Assistant

## 2022-11-06 ENCOUNTER — Telehealth (INDEPENDENT_AMBULATORY_CARE_PROVIDER_SITE_OTHER): Payer: Medicaid Other | Admitting: Nurse Practitioner

## 2022-11-06 VITALS — Resp 16 | Ht 63.0 in | Wt 170.0 lb

## 2022-11-06 DIAGNOSIS — B379 Candidiasis, unspecified: Secondary | ICD-10-CM

## 2022-11-06 DIAGNOSIS — Z20818 Contact with and (suspected) exposure to other bacterial communicable diseases: Secondary | ICD-10-CM

## 2022-11-06 DIAGNOSIS — J0100 Acute maxillary sinusitis, unspecified: Secondary | ICD-10-CM

## 2022-11-06 MED ORDER — AMOXICILLIN-POT CLAVULANATE 875-125 MG PO TABS: 1.0000 | ORAL_TABLET | Freq: Two times a day (BID) | ORAL | 0 refills | Status: AC

## 2022-11-06 MED ORDER — FLUCONAZOLE 150 MG PO TABS: 150.0000 mg | ORAL_TABLET | Freq: Once | ORAL | 0 refills | Status: AC

## 2022-11-06 NOTE — Progress Notes (Signed)
[facility] [address] [place], [place] [address]  Internal MEDICINE  Telephone Visit  Patient Name: Paula Sullivan  [id number]  [id number]  Date of Service: 11/06/2022  I connected with the patient at 1645 by telephone and verified the patients identity using two identifiers.   I discussed the limitations, risks, security and privacy concerns of performing an evaluation and management service by telephone and the availability of in person appointments. I also discussed with the patient that there may be a patient responsible charge related to the service.  The patient expressed understanding and agrees to proceed.    Chief Complaint  Patient presents with   Telephone Screen    Sinus infection----son positive for strep today, head pressure , low grade fever, congestion, [phone]   Telephone Assessment    HPI Paula Sullivan presents for a telehealth virtual visit for sinus infection. Son positive for strep today. Reports head pressure, sinus pressure, fever, congestion.    Current Medication: Outpatient Encounter Medications as of 11/06/2022  Medication Sig   amoxicillin-clavulanate (AUGMENTIN) 875-125 MG tablet Take 1 tablet by mouth 2 (two) times daily for 10 days. Take with food   amphetamine-dextroamphetamine (ADDERALL) 20 MG tablet Take 1 tablet (20 mg total) by mouth 2 (two) times daily.   amphetamine-dextroamphetamine (ADDERALL) 20 MG tablet Take 1 tablet (20 mg total) by mouth 2 (two) times daily.   amphetamine-dextroamphetamine (ADDERALL) 20 MG tablet Take 1 tablet (20 mg total) by mouth 2 (two) times daily.   azithromycin (ZITHROMAX) 250 MG tablet Use as directed for 5days   cetirizine (ZYRTEC) 10 MG tablet Take 1 tablet (10 mg total) by mouth daily.   cholecalciferol (VITAMIN D3) 25 MCG (1000 UNIT) tablet Take 1 tablet (1,000 Units total) by mouth daily.   [EXPIRED] fluconazole (DIFLUCAN) 150 MG tablet Take 1 tablet (150 mg total) by mouth once for 1 dose. May take  an additional dose after 3 days if still symptomatic.   metroNIDAZOLE (METROGEL) 0.75 % vaginal gel Place 1 Applicatorful vaginally at bedtime. Apply one applicatorful to vagina at bedtime for 5 days   omeprazole (PRILOSEC) 40 MG capsule Take 1 capsule (40 mg total) by mouth daily.   [DISCONTINUED] norgestimate-ethinyl estradiol (ORTHO-CYCLEN) 0.25-35 MG-MCG tablet Take 1 tablet by mouth daily.   No facility-administered encounter medications on file as of 11/06/2022.    Surgical History: Past Surgical History:  Procedure Laterality Date   CHOLECYSTECTOMY     GALLBLADDER SURGERY  01/2005   LAPAROSCOPIC BILATERAL SALPINGECTOMY Bilateral 06/13/2021   Procedure: LAPAROSCOPIC BILATERAL SALPINGECTOMY;  Surgeon: Nadara Mustard, MD;  Location: [facility] ORS;  Service: Gynecology;  Laterality: Bilateral;   SLEEVE GASTROPLASTY      Medical History: Past Medical History:  Diagnosis Date   ADHD    Anemia    PONV (postoperative nausea and vomiting)    Type AB blood, Rh negative     Family History: Family History  Problem Relation Age of Onset   Suicidality Father        2011   Diabetes Maternal Grandfather    Cancer Mother 62       hyst--? cervical    Social History   Socioeconomic History   Marital status: Single    Spouse name: Not on file   Number of children: 1   Years of education: Not on file   Highest education level: Not on file  Occupational History   Not on file  Tobacco Use   Smoking status: Never  Smokeless tobacco: Never  Vaping Use   Vaping Use: Never used  Substance and Sexual Activity   Alcohol use: Not Currently    Comment: Rarely   Drug use: No   Sexual activity: Yes    Birth control/protection: None  Other Topics Concern   Not on file  Social History Narrative   Not on file   Social Determinants of Health   Financial Resource Strain: Not on file  Food Insecurity: Not on file  Transportation Needs: Not on file  Physical Activity: Not on file   Stress: Not on file  Social Connections: Not on file  Intimate Partner Violence: Not on file      Review of Systems  Constitutional:  Positive for fatigue and fever.  HENT:  Positive for postnasal drip, rhinorrhea, sinus pressure and sinus pain.   Respiratory:  Negative for cough, chest tightness, shortness of breath and wheezing.   Cardiovascular: Negative.  Negative for chest pain and palpitations.  Gastrointestinal:  Negative for constipation, diarrhea, nausea and vomiting.  Neurological:  Positive for headaches.    Vital Signs: Resp 16   Ht 5\' 3"  (1.6 m)   Wt 170 lb (77.1 kg)   BMI 30.11 kg/m    Observation/Objective: She is alert and oriented and engages in conversation appropriately. No acute distress noted.     Assessment/Plan: 1. Acute non-recurrent maxillary sinusitis Augmentin prescribed to treat probably sinus infection vs strep throat - amoxicillin-clavulanate (AUGMENTIN) 875-125 MG tablet; Take 1 tablet by mouth 2 (two) times daily for 10 days. Take with food  Dispense: 20 tablet; Refill: 0  2. Exposure to strep throat Augmentin prescribed to treat possible strep throat.  - amoxicillin-clavulanate (AUGMENTIN) 875-125 MG tablet; Take 1 tablet by mouth 2 (two) times daily for 10 days. Take with food  Dispense: 20 tablet; Refill: 0  3. Antibiotic-induced yeast infection Fluconazole prescribed just in case  - fluconazole (DIFLUCAN) 150 MG tablet; Take 1 tablet (150 mg total) by mouth once for 1 dose. May take an additional dose after 3 days if still symptomatic.  Dispense: 3 tablet; Refill: 0   General Counseling: Paula Sullivan verbalizes understanding of the findings of today's phone visit and agrees with plan of treatment. I have discussed any further diagnostic evaluation that may be needed or ordered today. We also reviewed her medications today. she has been encouraged to call the office with any questions or concerns that should arise related to todays  visit.  Return if symptoms worsen or fail to improve.   No orders of the defined types were placed in this encounter.   Meds ordered this encounter  Medications   amoxicillin-clavulanate (AUGMENTIN) 875-125 MG tablet    Sig: Take 1 tablet by mouth 2 (two) times daily for 10 days. Take with food    Dispense:  20 tablet    Refill:  0   fluconazole (DIFLUCAN) 150 MG tablet    Sig: Take 1 tablet (150 mg total) by mouth once for 1 dose. May take an additional dose after 3 days if still symptomatic.    Dispense:  3 tablet    Refill:  0    Time spent:10 Minutes Time spent with patient included reviewing progress notes, labs, imaging studies, and discussing plan for follow up.  [place] Controlled Substance Database was reviewed by me for overdose risk score (ORS) if appropriate.  This patient was seen by Sallyanne Kuster, FNP-C in collaboration with Dr. Beverely Risen as a part of collaborative care agreement.  Yichen Gilardi R. Valetta Fuller, MSN, FNP-C Internal medicine

## 2022-11-09 ENCOUNTER — Encounter: Payer: Self-pay | Admitting: Nurse Practitioner

## 2022-11-17 ENCOUNTER — Encounter: Payer: Self-pay | Admitting: Nurse Practitioner

## 2022-11-18 ENCOUNTER — Other Ambulatory Visit: Payer: Self-pay

## 2022-11-18 MED ORDER — FLUCONAZOLE 150 MG PO TABS: 150.0000 mg | ORAL_TABLET | Freq: Every day | ORAL | 0 refills | Status: DC

## 2022-11-27 ENCOUNTER — Ambulatory Visit: Payer: 59 | Admitting: Physician Assistant

## 2022-12-03 ENCOUNTER — Ambulatory Visit: Payer: 59 | Admitting: Nurse Practitioner

## 2022-12-11 ENCOUNTER — Encounter: Payer: Self-pay | Admitting: Nurse Practitioner

## 2022-12-11 ENCOUNTER — Ambulatory Visit (INDEPENDENT_AMBULATORY_CARE_PROVIDER_SITE_OTHER): Payer: 59 | Admitting: Nurse Practitioner

## 2022-12-11 VITALS — BP 126/78 | HR 81 | Temp 98.6°F | Resp 16 | Ht 63.0 in | Wt 171.6 lb

## 2022-12-11 DIAGNOSIS — M1712 Unilateral primary osteoarthritis, left knee: Secondary | ICD-10-CM

## 2022-12-11 DIAGNOSIS — J301 Allergic rhinitis due to pollen: Secondary | ICD-10-CM

## 2022-12-11 DIAGNOSIS — F418 Other specified anxiety disorders: Secondary | ICD-10-CM

## 2022-12-11 DIAGNOSIS — T63481A Toxic effect of venom of other arthropod, accidental (unintentional), initial encounter: Secondary | ICD-10-CM | POA: Diagnosis not present

## 2022-12-11 DIAGNOSIS — F988 Other specified behavioral and emotional disorders with onset usually occurring in childhood and adolescence: Secondary | ICD-10-CM | POA: Diagnosis not present

## 2022-12-11 MED ORDER — AMPHETAMINE-DEXTROAMPHETAMINE 20 MG PO TABS: 20.0000 mg | ORAL_TABLET | Freq: Two times a day (BID) | ORAL | 0 refills | Status: DC

## 2022-12-11 MED ORDER — DICLOFENAC SODIUM 75 MG PO TBEC: DELAYED_RELEASE_TABLET | ORAL | 3 refills | Status: DC

## 2022-12-11 NOTE — Progress Notes (Signed)
[facility] [address] [place], [place] [address]  Internal MEDICINE  Office Visit Note  Patient Name: Paula Sullivan  [id number]  [id number]  Date of Service: 12/11/2022  Chief Complaint  Patient presents with   Follow-up    Med refills     HPI Kaeda presents for a follow-up visit for allergies and ADHD.  ADHD -- current dose of adderall remains effective. Her heart rate and BP are normal. She denies any palpitations or other adverse reactions.  Stopped lexapro and trazodone, feeling less groggy and improved modo.  Paperwork for new job, need exemption from varicella vaccine due to allergic reaction.  Allergic rhinitis -- was taking cetirizine, switched to levocetirizine and is doing better,symptoms are more controlled.     Current Medication: Outpatient Encounter Medications as of 12/11/2022  Medication Sig   cetirizine (ZYRTEC) 10 MG tablet Take 1 tablet (10 mg total) by mouth daily.   cholecalciferol (VITAMIN D3) 25 MCG (1000 UNIT) tablet Take 1 tablet (1,000 Units total) by mouth daily.   omeprazole (PRILOSEC) 40 MG capsule Take 1 capsule (40 mg total) by mouth daily.   [DISCONTINUED] amphetamine-dextroamphetamine (ADDERALL) 20 MG tablet Take 1 tablet (20 mg total) by mouth 2 (two) times daily.   [DISCONTINUED] amphetamine-dextroamphetamine (ADDERALL) 20 MG tablet Take 1 tablet (20 mg total) by mouth 2 (two) times daily.   [DISCONTINUED] amphetamine-dextroamphetamine (ADDERALL) 20 MG tablet Take 1 tablet (20 mg total) by mouth 2 (two) times daily.   [DISCONTINUED] azithromycin (ZITHROMAX) 250 MG tablet Use as directed for 5days   [DISCONTINUED] fluconazole (DIFLUCAN) 150 MG tablet Take 1 tablet (150 mg total) by mouth daily. Take 1 tab po once may repeat in 3 days if symptoms persists   [DISCONTINUED] metroNIDAZOLE (METROGEL) 0.75 % vaginal gel Place 1 Applicatorful vaginally at bedtime. Apply one applicatorful to vagina at bedtime for 5 days   [START ON  02/05/2023] amphetamine-dextroamphetamine (ADDERALL) 20 MG tablet Take 1 tablet (20 mg total) by mouth 2 (two) times daily.   [START ON 01/08/2023] amphetamine-dextroamphetamine (ADDERALL) 20 MG tablet Take 1 tablet (20 mg total) by mouth 2 (two) times daily.   amphetamine-dextroamphetamine (ADDERALL) 20 MG tablet Take 1 tablet (20 mg total) by mouth 2 (two) times daily.   diclofenac (VOLTAREN) 75 MG EC tablet TAKE 1 TO 2 TABLETS BY MOUTH DAILY AS NEEDED FOR PAIN   EPIPEN 2-PAK 0.3 MG/0.3ML SOAJ injection as directed Injection as directed for 30 days   levocetirizine (XYZAL) 5 MG tablet 1 tablet in the evening Orally Once a day for 30 days   [DISCONTINUED] diclofenac (VOLTAREN) 75 MG EC tablet TAKE 1 TO 2 TABLETS BY MOUTH DAILY AS NEEDED FOR PAIN   [DISCONTINUED] norgestimate-ethinyl estradiol (ORTHO-CYCLEN) 0.25-35 MG-MCG tablet Take 1 tablet by mouth daily.   No facility-administered encounter medications on file as of 12/11/2022.    Surgical History: Past Surgical History:  Procedure Laterality Date   CHOLECYSTECTOMY     GALLBLADDER SURGERY  01/2005   LAPAROSCOPIC BILATERAL SALPINGECTOMY Bilateral 06/13/2021   Procedure: LAPAROSCOPIC BILATERAL SALPINGECTOMY;  Surgeon: Nadara Mustard, MD;  Location: [facility] ORS;  Service: Gynecology;  Laterality: Bilateral;   SLEEVE GASTROPLASTY      Medical History: Past Medical History:  Diagnosis Date   ADHD    Anemia    PONV (postoperative nausea and vomiting)    Type AB blood, Rh negative     Family History: Family History  Problem Relation Age of Onset   Suicidality Father  2011   Diabetes Maternal Grandfather    Cancer Mother 16       hyst--? cervical    Social History   Socioeconomic History   Marital status: Single    Spouse name: Not on file   Number of children: 1   Years of education: Not on file   Highest education level: Not on file  Occupational History   Not on file  Tobacco Use   Smoking status: Never    Smokeless tobacco: Never  Vaping Use   Vaping Use: Never used  Substance and Sexual Activity   Alcohol use: Not Currently    Comment: Rarely   Drug use: No   Sexual activity: Yes    Birth control/protection: None  Other Topics Concern   Not on file  Social History Narrative   Not on file   Social Determinants of Health   Financial Resource Strain: Not on file  Food Insecurity: Not on file  Transportation Needs: Not on file  Physical Activity: Not on file  Stress: Not on file  Social Connections: Not on file  Intimate Partner Violence: Not on file      Review of Systems  Constitutional:  Negative for activity change, appetite change, chills, fatigue, fever and unexpected weight change.  HENT: Negative.  Negative for congestion, ear pain, rhinorrhea, sore throat and trouble swallowing.   Eyes: Negative.   Respiratory: Negative.  Negative for cough, chest tightness, shortness of breath and wheezing.   Cardiovascular: Negative.  Negative for chest pain and palpitations.  Gastrointestinal: Negative.  Negative for abdominal pain, blood in stool, constipation, diarrhea, nausea and vomiting.  Endocrine: Negative.   Genitourinary: Negative.  Negative for difficulty urinating, dysuria, frequency, hematuria and urgency.  Musculoskeletal: Negative.  Negative for arthralgias, back pain, joint swelling, myalgias and neck pain.  Skin: Negative.  Negative for rash and wound.  Allergic/Immunologic: Negative.  Negative for immunocompromised state.  Neurological: Negative.  Negative for dizziness, seizures, numbness and headaches.  Hematological: Negative.   Psychiatric/Behavioral: Negative.  Negative for behavioral problems, self-injury and suicidal ideas. The patient is not nervous/anxious.     Vital Signs: BP 126/78   Pulse 81   Temp 98.6 F (37 C)   Resp 16   Ht 5\' 3"  (1.6 m)   Wt 171 lb 9.6 oz (77.8 kg)   SpO2 96%   BMI 30.40 kg/m    Physical Exam Vitals reviewed.   Constitutional:      General: She is not in acute distress.    Appearance: Normal appearance. She is obese. She is not ill-appearing.  HENT:     Head: Normocephalic and atraumatic.  Eyes:     Pupils: Pupils are equal, round, and reactive to light.  Cardiovascular:     Rate and Rhythm: Normal rate and regular rhythm.  Pulmonary:     Effort: Pulmonary effort is normal. No respiratory distress.  Neurological:     Mental Status: She is alert and oriented to person, place, and time.  Psychiatric:        Mood and Affect: Mood normal.        Behavior: Behavior normal.        Assessment/Plan: 1. Non-seasonal allergic rhinitis due to pollen Continue levocetirizine as prescribed.  - levocetirizine (XYZAL) 5 MG tablet; 1 tablet in the evening Orally Once a day for 30 days  2. Primary osteoarthritis of left knee Continue diclofenac as prescribed.  - diclofenac (VOLTAREN) 75 MG EC tablet; TAKE 1 TO 2  TABLETS BY MOUTH DAILY AS NEEDED FOR PAIN  Dispense: 60 tablet; Refill: 3  3. Attention deficit disorder (ADD) in adult Continue adderall as prescribed. Follow up in 3 months for additional refills - amphetamine-dextroamphetamine (ADDERALL) 20 MG tablet; Take 1 tablet (20 mg total) by mouth 2 (two) times daily.  Dispense: 60 tablet; Refill: 0 - amphetamine-dextroamphetamine (ADDERALL) 20 MG tablet; Take 1 tablet (20 mg total) by mouth 2 (two) times daily.  Dispense: 60 tablet; Refill: 0 - amphetamine-dextroamphetamine (ADDERALL) 20 MG tablet; Take 1 tablet (20 mg total) by mouth 2 (two) times daily.  Dispense: 60 tablet; Refill: 0  4. Anxiety with depression Doing well, stopped taking trazodone and lexapro  5. Allergic reaction to insect sting, accidental or unintentional, initial encounter Keep epipen on hand if needed.  - EPIPEN 2-PAK 0.3 MG/0.3ML SOAJ injection; as directed Injection as directed for 30 days   General Counseling: Eevee verbalizes understanding of the findings of todays  visit and agrees with plan of treatment. I have discussed any further diagnostic evaluation that may be needed or ordered today. We also reviewed her medications today. she has been encouraged to call the office with any questions or concerns that should arise related to todays visit.    No orders of the defined types were placed in this encounter.   Meds ordered this encounter  Medications   amphetamine-dextroamphetamine (ADDERALL) 20 MG tablet    Sig: Take 1 tablet (20 mg total) by mouth 2 (two) times daily.    Dispense:  60 tablet    Refill:  0    Fill for 02/05/23   amphetamine-dextroamphetamine (ADDERALL) 20 MG tablet    Sig: Take 1 tablet (20 mg total) by mouth 2 (two) times daily.    Dispense:  60 tablet    Refill:  0    Fill for 01/08/23   amphetamine-dextroamphetamine (ADDERALL) 20 MG tablet    Sig: Take 1 tablet (20 mg total) by mouth 2 (two) times daily.    Dispense:  60 tablet    Refill:  0    Fill for 12/11/22   diclofenac (VOLTAREN) 75 MG EC tablet    Sig: TAKE 1 TO 2 TABLETS BY MOUTH DAILY AS NEEDED FOR PAIN    Dispense:  60 tablet    Refill:  3    Return in about 12 weeks (around 03/05/2023) for F/U, ADHD med check with Lauren PCP.   Total time spent:30 Minutes Time spent includes review of chart, medications, test results, and follow up plan with the patient.   [place] Controlled Substance Database was reviewed by me.  This patient was seen by Sallyanne Kuster, FNP-C in collaboration with Dr. Beverely Risen as a part of collaborative care agreement.   Kadesha Virrueta R. Tedd Sias, MSN, FNP-C Internal medicine

## 2022-12-12 ENCOUNTER — Telehealth: Payer: Self-pay

## 2022-12-12 NOTE — Telephone Encounter (Signed)
PA was done and approved for Diclofenac Sodium.

## 2022-12-18 ENCOUNTER — Encounter: Payer: Self-pay | Admitting: Obstetrics and Gynecology

## 2022-12-18 MED ORDER — METRONIDAZOLE 0.75 % VA GEL: 1.0000 | Freq: Every day | VAGINAL | 2 refills | Status: AC

## 2022-12-31 ENCOUNTER — Ambulatory Visit (INDEPENDENT_AMBULATORY_CARE_PROVIDER_SITE_OTHER): Payer: 59 | Admitting: Gastroenterology

## 2022-12-31 ENCOUNTER — Encounter: Payer: Self-pay | Admitting: Gastroenterology

## 2022-12-31 VITALS — BP 130/84 | HR 87 | Temp 98.3°F | Ht 63.0 in | Wt 171.2 lb

## 2022-12-31 DIAGNOSIS — R1013 Epigastric pain: Secondary | ICD-10-CM | POA: Diagnosis not present

## 2022-12-31 DIAGNOSIS — K5909 Other constipation: Secondary | ICD-10-CM

## 2022-12-31 DIAGNOSIS — R140 Abdominal distension (gaseous): Secondary | ICD-10-CM

## 2022-12-31 NOTE — Progress Notes (Signed)
Arlyss Repress, MD [address]  [address]  [place], [place] [address]  Main: [phone]  Fax: [phone]    Gastroenterology Consultation  Referring Provider:     Carlean Jews, PA* Primary Care Physician:  Carlean Jews, PA-C Primary Gastroenterologist:  Dr. Arlyss Repress Reason for Consultation: Abdominal bloating        HPI:   Paula Sullivan is a 37 y.o. female referred by Carlean Jews, PA-C  for consultation & management of chronic symptoms of abdominal bloating and constipation.  She had history of gastric sleeve in 2022, reports chronic constipation, bowel frequency is irregular, sometimes with no bowel movement for about a week associated with abdominal bloating, substernal pain, worse after eating.  She is on Adderall which suppresses her appetite and does not have 3 meals a day.  She reports that she tried a pill from her friend long time ago which helped move her bowels.  Labs reveal normal CBC, CMP, iron panel, TSH  Patient does not smoke or drink alcohol  NSAIDs: None  Antiplts/Anticoagulants/Anti thrombotics: None  GI Procedures: None She denies any family history of GI malignancy  Past Medical History:  Diagnosis Date   ADHD    Anemia    PONV (postoperative nausea and vomiting)    Type AB blood, Rh negative     Past Surgical History:  Procedure Laterality Date   CHOLECYSTECTOMY     GALLBLADDER SURGERY  01/2005   LAPAROSCOPIC BILATERAL SALPINGECTOMY Bilateral 06/13/2021   Procedure: LAPAROSCOPIC BILATERAL SALPINGECTOMY;  Surgeon: Nadara Mustard, MD;  Location: [facility] ORS;  Service: Gynecology;  Laterality: Bilateral;   SLEEVE GASTROPLASTY        Current Outpatient Medications:    [START ON 02/05/2023] amphetamine-dextroamphetamine (ADDERALL) 20 MG tablet, Take 1 tablet (20 mg total) by mouth 2 (two) times daily., Disp: 60 tablet, Rfl: 0   [START ON 01/08/2023] amphetamine-dextroamphetamine (ADDERALL) 20 MG tablet, Take 1  tablet (20 mg total) by mouth 2 (two) times daily., Disp: 60 tablet, Rfl: 0   amphetamine-dextroamphetamine (ADDERALL) 20 MG tablet, Take 1 tablet (20 mg total) by mouth 2 (two) times daily., Disp: 60 tablet, Rfl: 0   cetirizine (ZYRTEC) 10 MG tablet, Take 1 tablet (10 mg total) by mouth daily., Disp: 90 tablet, Rfl: 3   cholecalciferol (VITAMIN D3) 25 MCG (1000 UNIT) tablet, Take 1 tablet (1,000 Units total) by mouth daily., Disp: 90 tablet, Rfl: 1   diclofenac (VOLTAREN) 75 MG EC tablet, TAKE 1 TO 2 TABLETS BY MOUTH DAILY AS NEEDED FOR PAIN, Disp: 60 tablet, Rfl: 3   EPIPEN 2-PAK 0.3 MG/0.3ML SOAJ injection, as directed Injection as directed for 30 days, Disp: , Rfl:    levocetirizine (XYZAL) 5 MG tablet, 1 tablet in the evening Orally Once a day for 30 days, Disp: , Rfl:    omeprazole (PRILOSEC) 40 MG capsule, Take 1 capsule (40 mg total) by mouth daily., Disp: 30 capsule, Rfl: 3   Family History  Problem Relation Age of Onset   Suicidality Father        2011   Diabetes Maternal Grandfather    Cancer Mother 48       hyst--? cervical     Social History   Tobacco Use   Smoking status: Never   Smokeless tobacco: Never  Vaping Use   Vaping Use: Never used  Substance Use Topics   Alcohol use: Not Currently    Comment: Rarely   Drug use:  No    Allergies as of 12/31/2022 - Review Complete 12/31/2022  Allergen Reaction Noted   Latex Rash 10/20/2011    Review of Systems:    All systems reviewed and negative except where noted in HPI.   Physical Exam:  BP 130/84 (BP Location: Left Arm, Patient Position: Sitting, Cuff Size: Normal)   Pulse 87   Temp 98.3 F (36.8 C) (Oral)   Ht 5\' 3"  (1.6 m)   Wt 171 lb 4 oz (77.7 kg)   BMI 30.34 kg/m  No LMP recorded.  General:   Alert,  Well-developed, well-nourished, pleasant and cooperative in NAD Head:  Normocephalic and atraumatic. Eyes:  Sclera clear, no icterus.   Conjunctiva pink. Ears:  Normal auditory acuity. Nose:  No  deformity, discharge, or lesions. Mouth:  No deformity or lesions,oropharynx pink & moist. Neck:  Supple; no masses or thyromegaly. Lungs:  Respirations even and unlabored.  Clear throughout to auscultation.   No wheezes, crackles, or rhonchi. No acute distress. Heart:  Regular rate and rhythm; no murmurs, clicks, rubs, or gallops. Abdomen:  Normal bowel sounds. Soft, non-tender and moderately distended, tympanic without masses, hepatosplenomegaly or hernias noted.  No guarding or rebound tenderness.   Rectal: Not performed Msk:  Symmetrical without gross deformities. Good, equal movement & strength bilaterally. Pulses:  Normal pulses noted. Extremities:  No clubbing or edema.  No cyanosis. Neurologic:  Alert and oriented x3;  grossly normal neurologically. Skin:  Intact without significant lesions or rashes. No jaundice. Psych:  Alert and cooperative. Normal mood and affect.  Imaging Studies: No abdominal imaging  Assessment and Plan:   Paula Sullivan is a 37 y.o. female with cholecystectomy in 2006, history of gastric sleeve in 2022 is seen in consultation for chronic constipation, abdominal bloating and epigastric discomfort  Trial of Linzess 145 mcg daily, samples provided H. pylori breath test to evaluate for epigastric discomfort   Follow up as needed   Arlyss Repress, MD

## 2022-12-31 NOTE — Patient Instructions (Signed)
Gave Linzess 145 samples. Take 1 tablet every morning. Let us know how it works and we can call you in a prescription.

## 2023-01-02 LAB — H. PYLORI BREATH TEST: H pylori Breath Test: NEGATIVE

## 2023-01-20 ENCOUNTER — Encounter: Payer: Self-pay | Admitting: Gastroenterology

## 2023-01-20 MED ORDER — LINACLOTIDE 145 MCG PO CAPS: 145.0000 ug | ORAL_CAPSULE | Freq: Every day | ORAL | 2 refills | Status: DC

## 2023-01-20 NOTE — Telephone Encounter (Signed)
Gave Linzess 145 samples. Take 1 tablet every morning. Let us know how it works and we can call you in a prescription.     Gave On 12/31/2022

## 2023-02-05 ENCOUNTER — Telehealth: Payer: Self-pay

## 2023-02-05 ENCOUNTER — Encounter: Payer: Self-pay | Admitting: Nurse Practitioner

## 2023-02-05 DIAGNOSIS — R140 Abdominal distension (gaseous): Secondary | ICD-10-CM

## 2023-02-05 MED ORDER — OMEPRAZOLE 40 MG PO CPDR: 40.0000 mg | DELAYED_RELEASE_CAPSULE | Freq: Every day | ORAL | 3 refills | Status: DC

## 2023-02-05 NOTE — Telephone Encounter (Signed)
Refilled patient's Omeprazole.

## 2023-02-26 ENCOUNTER — Ambulatory Visit (INDEPENDENT_AMBULATORY_CARE_PROVIDER_SITE_OTHER): Payer: 59 | Admitting: Nurse Practitioner

## 2023-02-26 ENCOUNTER — Encounter: Payer: Self-pay | Admitting: Nurse Practitioner

## 2023-02-26 VITALS — BP 120/82 | HR 83 | Temp 98.4°F | Resp 16 | Ht 63.0 in | Wt 172.4 lb

## 2023-02-26 DIAGNOSIS — J0101 Acute recurrent maxillary sinusitis: Secondary | ICD-10-CM

## 2023-02-26 DIAGNOSIS — T3695XA Adverse effect of unspecified systemic antibiotic, initial encounter: Secondary | ICD-10-CM | POA: Diagnosis not present

## 2023-02-26 DIAGNOSIS — H1031 Unspecified acute conjunctivitis, right eye: Secondary | ICD-10-CM | POA: Diagnosis not present

## 2023-02-26 DIAGNOSIS — B379 Candidiasis, unspecified: Secondary | ICD-10-CM

## 2023-02-26 MED ORDER — FLUCONAZOLE 150 MG PO TABS: 150.0000 mg | ORAL_TABLET | Freq: Once | ORAL | 0 refills | Status: AC

## 2023-02-26 MED ORDER — AMOXICILLIN-POT CLAVULANATE 875-125 MG PO TABS: 1.0000 | ORAL_TABLET | Freq: Two times a day (BID) | ORAL | 0 refills | Status: DC

## 2023-02-26 MED ORDER — OFLOXACIN 0.3 % OP SOLN: 1.0000 [drp] | Freq: Four times a day (QID) | OPHTHALMIC | 0 refills | Status: AC

## 2023-02-26 NOTE — Progress Notes (Signed)
[facility] [address] [place], [place] [address]  Internal MEDICINE  Office Visit Note  Patient Name: Paula Sullivan  [id number]  [id number]  Date of Service: 02/12/2023  Chief Complaint  Patient presents with   Acute Visit   Ear Pain    Right side facial pain/congestion, also impacting eye.     HPI [place] presents for an acute sick visit for right side sinus problems.  --onset: on 02/14/23, about 12 days ago ---nasal congestion right nare, right eye swollen and draining, right ear pain, right ear fullness, right side sinus pressure.       Current Medication:  Outpatient Encounter Medications as of 02/26/2023  Medication Sig   amoxicillin-clavulanate (AUGMENTIN) 875-125 MG tablet Take 1 tablet by mouth 2 (two) times daily.   amphetamine-dextroamphetamine (ADDERALL) 20 MG tablet Take 1 tablet (20 mg total) by mouth 2 (two) times daily.   amphetamine-dextroamphetamine (ADDERALL) 20 MG tablet Take 1 tablet (20 mg total) by mouth 2 (two) times daily.   amphetamine-dextroamphetamine (ADDERALL) 20 MG tablet Take 1 tablet (20 mg total) by mouth 2 (two) times daily.   cetirizine (ZYRTEC) 10 MG tablet Take 1 tablet (10 mg total) by mouth daily.   cholecalciferol (VITAMIN D3) 25 MCG (1000 UNIT) tablet Take 1 tablet (1,000 Units total) by mouth daily.   diclofenac (VOLTAREN) 75 MG EC tablet TAKE 1 TO 2 TABLETS BY MOUTH DAILY AS NEEDED FOR PAIN   EPIPEN 2-PAK 0.3 MG/0.3ML SOAJ injection as directed Injection as directed for 30 days   [EXPIRED] fluconazole (DIFLUCAN) 150 MG tablet Take 1 tablet (150 mg total) by mouth once for 1 dose. May take an additional dose after 3 days if still symptomatic.   levocetirizine (XYZAL) 5 MG tablet 1 tablet in the evening Orally Once a day for 30 days   linaclotide (LINZESS) 145 MCG CAPS capsule Take 1 capsule (145 mcg total) by mouth daily before breakfast.   [EXPIRED] ofloxacin (OCUFLOX) 0.3 % ophthalmic solution Place 1 drop into the right  eye 4 (four) times daily for 5 days.   omeprazole (PRILOSEC) 40 MG capsule Take 1 capsule (40 mg total) by mouth daily.   [DISCONTINUED] norgestimate-ethinyl estradiol (ORTHO-CYCLEN) 0.25-35 MG-MCG tablet Take 1 tablet by mouth daily.   No facility-administered encounter medications on file as of 02/26/2023.      Medical History: Past Medical History:  Diagnosis Date   ADHD    Anemia    PONV (postoperative nausea and vomiting)    Type AB blood, Rh negative      Vital Signs: BP 120/82   Pulse 83   Temp 98.4 F (36.9 C)   Resp 16   Ht 5\' 3"  (1.6 m)   Wt 172 lb 6.4 oz (78.2 kg)   SpO2 97%   BMI 30.54 kg/m    Review of Systems  HENT:  Positive for congestion, ear pain, facial swelling, postnasal drip, rhinorrhea, sinus pressure and sinus pain.        Right side only  Eyes:  Positive for photophobia, pain, discharge, redness, itching and visual disturbance.       Right side only   Respiratory: Negative.  Negative for cough, chest tightness, shortness of breath and wheezing.   Cardiovascular: Negative.  Negative for chest pain and palpitations.  Neurological:  Positive for headaches.    Physical Exam Vitals reviewed.  Constitutional:      General: She is not in acute distress.    Appearance: Normal appearance. She is  ill-appearing.  HENT:     Head: Normocephalic and atraumatic.     Nose: Congestion and rhinorrhea present.     Mouth/Throat:     Mouth: Mucous membranes are moist.     Pharynx: Posterior oropharyngeal erythema present.  Eyes:     General: Lids are normal. Vision grossly intact. Gaze aligned appropriately.        Right eye: Discharge present.  Neurological:     Mental Status: She is alert.       Assessment/Plan: 1. Acute recurrent maxillary sinusitis Antibiotic prescribed, take until gone.  - amoxicillin-clavulanate (AUGMENTIN) 875-125 MG tablet; Take 1 tablet by mouth 2 (two) times daily.  Dispense: 20 tablet; Refill: 0  2. Acute bacterial  conjunctivitis of right eye Eye drops prescribed.  - ofloxacin (OCUFLOX) 0.3 % ophthalmic solution; Place 1 drop into the right eye 4 (four) times daily for 5 days.  Dispense: 5 mL; Refill: 0  3. Antibiotic-induced yeast infection Fluconazole prescribed.  - fluconazole (DIFLUCAN) 150 MG tablet; Take 1 tablet (150 mg total) by mouth once for 1 dose. May take an additional dose after 3 days if still symptomatic.  Dispense: 3 tablet; Refill: 0   General Counseling: Danyal verbalizes understanding of the findings of todays visit and agrees with plan of treatment. I have discussed any further diagnostic evaluation that may be needed or ordered today. We also reviewed her medications today. she has been encouraged to call the office with any questions or concerns that should arise related to todays visit.    Counseling:    No orders of the defined types were placed in this encounter.   Meds ordered this encounter  Medications   amoxicillin-clavulanate (AUGMENTIN) 875-125 MG tablet    Sig: Take 1 tablet by mouth 2 (two) times daily.    Dispense:  20 tablet    Refill:  0   ofloxacin (OCUFLOX) 0.3 % ophthalmic solution    Sig: Place 1 drop into the right eye 4 (four) times daily for 5 days.    Dispense:  5 mL    Refill:  0   fluconazole (DIFLUCAN) 150 MG tablet    Sig: Take 1 tablet (150 mg total) by mouth once for 1 dose. May take an additional dose after 3 days if still symptomatic.    Dispense:  3 tablet    Refill:  0    Return if symptoms worsen or fail to improve.  [place] Controlled Substance Database was reviewed by me for overdose risk score (ORS)  Time spent:20 Minutes Time spent with patient included reviewing progress notes, labs, imaging studies, and discussing plan for follow up.   This patient was seen by Sallyanne Kuster, FNP-C in collaboration with Dr. Beverely Risen as a part of collaborative care agreement.  Jevaun Strick R. Tedd Sias, MSN, FNP-C Internal Medicine

## 2023-03-05 ENCOUNTER — Encounter: Payer: Self-pay | Admitting: Physician Assistant

## 2023-03-05 ENCOUNTER — Ambulatory Visit: Payer: 59 | Admitting: Physician Assistant

## 2023-03-05 NOTE — Telephone Encounter (Signed)
Called and left vm to schedule appt for med refill-nm

## 2023-03-09 ENCOUNTER — Telehealth (INDEPENDENT_AMBULATORY_CARE_PROVIDER_SITE_OTHER): Payer: 59 | Admitting: Physician Assistant

## 2023-03-09 ENCOUNTER — Encounter: Payer: Self-pay | Admitting: Physician Assistant

## 2023-03-09 VITALS — Resp 16 | Ht 63.0 in

## 2023-03-09 DIAGNOSIS — J069 Acute upper respiratory infection, unspecified: Secondary | ICD-10-CM | POA: Diagnosis not present

## 2023-03-09 DIAGNOSIS — J0101 Acute recurrent maxillary sinusitis: Secondary | ICD-10-CM | POA: Diagnosis not present

## 2023-03-09 MED ORDER — AZITHROMYCIN 250 MG PO TABS: ORAL_TABLET | ORAL | 0 refills | Status: AC

## 2023-03-09 MED ORDER — OFLOXACIN 0.3 % OP SOLN
1.0000 [drp] | Freq: Four times a day (QID) | OPHTHALMIC | 0 refills | Status: DC
Start: 2023-03-09 — End: 2023-05-28

## 2023-03-09 NOTE — Progress Notes (Signed)
[facility] [address] [place], [place] [address]  Internal MEDICINE  Telephone Visit  Patient Name: Paula Sullivan  [id number]  [id number]  Date of Service: 03/09/2023  I connected with the patient at 8:54 by telephone and verified the patients identity using two identifiers.   I discussed the limitations, risks, security and privacy concerns of performing an evaluation and management service by telephone and the availability of in person appointments. I also discussed with the patient that there may be a patient responsible charge related to the service.  The patient expressed understanding and agrees to proceed.    Chief Complaint  Patient presents with   Telephone Screen    Patient is okay with telephone call: [phone]   Telephone Assessment   Ear Pain   Nasal Congestion    HPI  Pt is here for virtual sick visit -Seen a few weeks ago with sinus/eat infection and treated with augmentin, also given eye drops as her eyes had drainage as well -Now it has shifted onto left side of face with congestion, and muffled hearing, some pain along left ear, drainage from left eye now -Not much cough, some SOB and wheezing. -No fever or chills, a little body aches -covid test negative -taking sudafed, nasal spray -does request another round of eye drops due to new presentation on left eye now -given recurrence of sinus infections will refer to ENT  Current Medication: Outpatient Encounter Medications as of 03/09/2023  Medication Sig   amoxicillin-clavulanate (AUGMENTIN) 875-125 MG tablet Take 1 tablet by mouth 2 (two) times daily.   amphetamine-dextroamphetamine (ADDERALL) 20 MG tablet Take 1 tablet (20 mg total) by mouth 2 (two) times daily.   amphetamine-dextroamphetamine (ADDERALL) 20 MG tablet Take 1 tablet (20 mg total) by mouth 2 (two) times daily.   amphetamine-dextroamphetamine (ADDERALL) 20 MG tablet Take 1 tablet (20 mg total) by mouth 2 (two) times daily.    azithromycin (ZITHROMAX) 250 MG tablet Take 2 tablets on day 1, then 1 tablet daily on days 2 through 5   cetirizine (ZYRTEC) 10 MG tablet Take 1 tablet (10 mg total) by mouth daily.   cholecalciferol (VITAMIN D3) 25 MCG (1000 UNIT) tablet Take 1 tablet (1,000 Units total) by mouth daily.   diclofenac (VOLTAREN) 75 MG EC tablet TAKE 1 TO 2 TABLETS BY MOUTH DAILY AS NEEDED FOR PAIN   EPIPEN 2-PAK 0.3 MG/0.3ML SOAJ injection as directed Injection as directed for 30 days   levocetirizine (XYZAL) 5 MG tablet 1 tablet in the evening Orally Once a day for 30 days   linaclotide (LINZESS) 145 MCG CAPS capsule Take 1 capsule (145 mcg total) by mouth daily before breakfast.   ofloxacin (OCUFLOX) 0.3 % ophthalmic solution Place 1 drop into the left eye 4 (four) times daily.   omeprazole (PRILOSEC) 40 MG capsule Take 1 capsule (40 mg total) by mouth daily.   [DISCONTINUED] norgestimate-ethinyl estradiol (ORTHO-CYCLEN) 0.25-35 MG-MCG tablet Take 1 tablet by mouth daily.   No facility-administered encounter medications on file as of 03/09/2023.    Surgical History: Past Surgical History:  Procedure Laterality Date   CHOLECYSTECTOMY     GALLBLADDER SURGERY  01/2005   LAPAROSCOPIC BILATERAL SALPINGECTOMY Bilateral 06/13/2021   Procedure: LAPAROSCOPIC BILATERAL SALPINGECTOMY;  Surgeon: Nadara Mustard, MD;  Location: [facility] ORS;  Service: Gynecology;  Laterality: Bilateral;   SLEEVE GASTROPLASTY      Medical History: Past Medical History:  Diagnosis Date   ADHD    Anemia  PONV (postoperative nausea and vomiting)    Type AB blood, Rh negative     Family History: Family History  Problem Relation Age of Onset   Suicidality Father        2011   Diabetes Maternal Grandfather    Cancer Mother 14       hyst--? cervical    Social History   Socioeconomic History   Marital status: Single    Spouse name: Not on file   Number of children: 1   Years of education: Not on file   Highest education  level: Not on file  Occupational History   Not on file  Tobacco Use   Smoking status: Never   Smokeless tobacco: Never  Vaping Use   Vaping status: Never Used  Substance and Sexual Activity   Alcohol use: Not Currently    Comment: Rarely   Drug use: No   Sexual activity: Yes    Birth control/protection: None  Other Topics Concern   Not on file  Social History Narrative   Not on file   Social Determinants of Health   Financial Resource Strain: Not on file  Food Insecurity: Not on file  Transportation Needs: Not on file  Physical Activity: Not on file  Stress: Not on file  Social Connections: Not on file  Intimate Partner Violence: Not on file      Review of Systems  Constitutional:  Negative for fever.  HENT:  Positive for congestion, ear pain, postnasal drip, rhinorrhea, sinus pressure and sinus pain.        Right side only  Eyes:  Positive for pain, discharge, redness and itching.       Right side only   Respiratory:  Positive for cough, shortness of breath and wheezing. Negative for chest tightness.   Cardiovascular: Negative.  Negative for chest pain and palpitations.  Neurological:  Positive for headaches.    Vital Signs: Resp 16   Ht 5\' 3"  (1.6 m)   BMI 30.54 kg/m    Observation/Objective:  Pt is able to carry out conversation   Assessment/Plan: 1. Acute recurrent maxillary sinusitis Will start zpak and refer to ENT for recurrent sinus infections - Ambulatory referral to ENT - ofloxacin (OCUFLOX) 0.3 % ophthalmic solution; Place 1 drop into the left eye 4 (four) times daily.  Dispense: 5 mL; Refill: 0  2. Upper respiratory tract infection, unspecified type - azithromycin (ZITHROMAX) 250 MG tablet; Take 2 tablets on day 1, then 1 tablet daily on days 2 through 5  Dispense: 6 tablet; Refill: 0    General Counseling: Jovana verbalizes understanding of the findings of today's phone visit and agrees with plan of treatment. I have discussed any further  diagnostic evaluation that may be needed or ordered today. We also reviewed her medications today. she has been encouraged to call the office with any questions or concerns that should arise related to todays visit.    Orders Placed This Encounter  Procedures   Ambulatory referral to ENT    Meds ordered this encounter  Medications   azithromycin (ZITHROMAX) 250 MG tablet    Sig: Take 2 tablets on day 1, then 1 tablet daily on days 2 through 5    Dispense:  6 tablet    Refill:  0   ofloxacin (OCUFLOX) 0.3 % ophthalmic solution    Sig: Place 1 drop into the left eye 4 (four) times daily.    Dispense:  5 mL    Refill:  0  Time spent:30 Minutes    Dr Lyndon Code Internal medicine

## 2023-03-10 ENCOUNTER — Telehealth: Payer: Self-pay | Admitting: Physician Assistant

## 2023-03-10 NOTE — Telephone Encounter (Signed)
 Otolaryngology referral sent via [facility] to [facility]. Notified patient. Gave pt telephone # [phone]

## 2023-03-11 ENCOUNTER — Encounter: Payer: Self-pay | Admitting: Nurse Practitioner

## 2023-03-11 ENCOUNTER — Telehealth: Payer: Self-pay | Admitting: Physician Assistant

## 2023-03-11 NOTE — Telephone Encounter (Signed)
Otolaryngology appointment 03/23/2023 @ [facility] -Sheralyn Boatman

## 2023-03-17 DIAGNOSIS — L218 Other seborrheic dermatitis: Secondary | ICD-10-CM | POA: Diagnosis not present

## 2023-03-17 DIAGNOSIS — D485 Neoplasm of uncertain behavior of skin: Secondary | ICD-10-CM | POA: Diagnosis not present

## 2023-03-17 DIAGNOSIS — D2271 Melanocytic nevi of right lower limb, including hip: Secondary | ICD-10-CM | POA: Diagnosis not present

## 2023-03-17 DIAGNOSIS — D229 Melanocytic nevi, unspecified: Secondary | ICD-10-CM | POA: Diagnosis not present

## 2023-03-19 ENCOUNTER — Encounter: Payer: Self-pay | Admitting: Physician Assistant

## 2023-03-19 ENCOUNTER — Ambulatory Visit (INDEPENDENT_AMBULATORY_CARE_PROVIDER_SITE_OTHER): Payer: 59 | Admitting: Physician Assistant

## 2023-03-19 VITALS — BP 110/75 | HR 91 | Temp 98.4°F | Resp 16 | Ht 63.0 in | Wt 181.8 lb

## 2023-03-19 DIAGNOSIS — Z131 Encounter for screening for diabetes mellitus: Secondary | ICD-10-CM | POA: Diagnosis not present

## 2023-03-19 DIAGNOSIS — F988 Other specified behavioral and emotional disorders with onset usually occurring in childhood and adolescence: Secondary | ICD-10-CM | POA: Diagnosis not present

## 2023-03-19 DIAGNOSIS — M2550 Pain in unspecified joint: Secondary | ICD-10-CM | POA: Diagnosis not present

## 2023-03-19 DIAGNOSIS — R609 Edema, unspecified: Secondary | ICD-10-CM

## 2023-03-19 MED ORDER — AMPHETAMINE-DEXTROAMPHETAMINE 20 MG PO TABS
20.0000 mg | ORAL_TABLET | Freq: Two times a day (BID) | ORAL | 0 refills | Status: DC
Start: 2023-05-17 — End: 2023-09-14

## 2023-03-19 MED ORDER — AMPHETAMINE-DEXTROAMPHETAMINE 20 MG PO TABS
20.0000 mg | ORAL_TABLET | Freq: Two times a day (BID) | ORAL | 0 refills | Status: DC
Start: 2023-03-19 — End: 2023-05-28

## 2023-03-19 MED ORDER — AMPHETAMINE-DEXTROAMPHETAMINE 20 MG PO TABS
20.0000 mg | ORAL_TABLET | Freq: Two times a day (BID) | ORAL | 0 refills | Status: DC
Start: 2023-04-17 — End: 2023-05-28

## 2023-03-19 NOTE — Progress Notes (Signed)
[facility] [address] [place], [place] [address]  Internal MEDICINE  Office Visit Note  Patient Name: Paula Sullivan  [id number]  [id number]  Date of Service: 03/19/2023  Chief Complaint  Patient presents with   Follow-up   Medication Refill    HPI Pt is here for routine follow up -Still having sinus pressure despite several Abx, has ENT appt next week to address this -Doing well with adderral BID, no palpitations, sleeping well -having some swelling in extremities, drinking plenty of water. States both ankle/feet swell and has swelling in hands as well. No pain in extremities outside of knee pain which she gets shots for. -Denies SOB, CP -Does have family hx of diabetes and RA. Will check additional labs, but may need to consider echo in future -BP very stable  Current Medication: Outpatient Encounter Medications as of 03/19/2023  Medication Sig   amoxicillin-clavulanate (AUGMENTIN) 875-125 MG tablet Take 1 tablet by mouth 2 (two) times daily.   cetirizine (ZYRTEC) 10 MG tablet Take 1 tablet (10 mg total) by mouth daily.   cholecalciferol (VITAMIN D3) 25 MCG (1000 UNIT) tablet Take 1 tablet (1,000 Units total) by mouth daily.   diclofenac (VOLTAREN) 75 MG EC tablet TAKE 1 TO 2 TABLETS BY MOUTH DAILY AS NEEDED FOR PAIN   EPIPEN 2-PAK 0.3 MG/0.3ML SOAJ injection as directed Injection as directed for 30 days   levocetirizine (XYZAL) 5 MG tablet 1 tablet in the evening Orally Once a day for 30 days   linaclotide (LINZESS) 145 MCG CAPS capsule Take 1 capsule (145 mcg total) by mouth daily before breakfast.   ofloxacin (OCUFLOX) 0.3 % ophthalmic solution Place 1 drop into the left eye 4 (four) times daily.   omeprazole (PRILOSEC) 40 MG capsule Take 1 capsule (40 mg total) by mouth daily.   [DISCONTINUED] amphetamine-dextroamphetamine (ADDERALL) 20 MG tablet Take 1 tablet (20 mg total) by mouth 2 (two) times daily.   [DISCONTINUED] amphetamine-dextroamphetamine (ADDERALL)  20 MG tablet Take 1 tablet (20 mg total) by mouth 2 (two) times daily.   [DISCONTINUED] amphetamine-dextroamphetamine (ADDERALL) 20 MG tablet Take 1 tablet (20 mg total) by mouth 2 (two) times daily.   [START ON 04/17/2023] amphetamine-dextroamphetamine (ADDERALL) 20 MG tablet Take 1 tablet (20 mg total) by mouth 2 (two) times daily.   [START ON 05/17/2023] amphetamine-dextroamphetamine (ADDERALL) 20 MG tablet Take 1 tablet (20 mg total) by mouth 2 (two) times daily.   amphetamine-dextroamphetamine (ADDERALL) 20 MG tablet Take 1 tablet (20 mg total) by mouth 2 (two) times daily.   [DISCONTINUED] norgestimate-ethinyl estradiol (ORTHO-CYCLEN) 0.25-35 MG-MCG tablet Take 1 tablet by mouth daily.   No facility-administered encounter medications on file as of 03/19/2023.    Surgical History: Past Surgical History:  Procedure Laterality Date   CHOLECYSTECTOMY     GALLBLADDER SURGERY  01/2005   LAPAROSCOPIC BILATERAL SALPINGECTOMY Bilateral 06/13/2021   Procedure: LAPAROSCOPIC BILATERAL SALPINGECTOMY;  Surgeon: Nadara Mustard, MD;  Location: [facility] ORS;  Service: Gynecology;  Laterality: Bilateral;   SLEEVE GASTROPLASTY      Medical History: Past Medical History:  Diagnosis Date   ADHD    Anemia    PONV (postoperative nausea and vomiting)    Type AB blood, Rh negative     Family History: Family History  Problem Relation Age of Onset   Suicidality Father        2011   Diabetes Maternal Grandfather    Cancer Mother 61       hyst--? cervical  Social History   Socioeconomic History   Marital status: Single    Spouse name: Not on file   Number of children: 1   Years of education: Not on file   Highest education level: Not on file  Occupational History   Not on file  Tobacco Use   Smoking status: Never   Smokeless tobacco: Never  Vaping Use   Vaping status: Never Used  Substance and Sexual Activity   Alcohol use: Not Currently    Comment: Rarely   Drug use: No   Sexual  activity: Yes    Birth control/protection: None  Other Topics Concern   Not on file  Social History Narrative   Not on file   Social Determinants of Health   Financial Resource Strain: Not on file  Food Insecurity: Not on file  Transportation Needs: Not on file  Physical Activity: Not on file  Stress: Not on file  Social Connections: Not on file  Intimate Partner Violence: Not on file      Review of Systems  Constitutional:  Negative for chills, fatigue and unexpected weight change.  HENT:  Positive for congestion, postnasal drip and sinus pressure. Negative for rhinorrhea, sneezing and sore throat.   Eyes:  Negative for redness.  Respiratory:  Negative for cough, chest tightness and shortness of breath.   Cardiovascular:  Positive for leg swelling. Negative for chest pain and palpitations.  Gastrointestinal:  Negative for abdominal pain, constipation, diarrhea, nausea and vomiting.  Genitourinary:  Negative for dysuria and frequency.  Musculoskeletal:  Positive for arthralgias and joint swelling. Negative for back pain and neck pain.  Skin:  Negative for rash.  Neurological: Negative.  Negative for tremors and numbness.  Hematological:  Negative for adenopathy. Does not bruise/bleed easily.  Psychiatric/Behavioral:  Negative for behavioral problems (Depression), sleep disturbance and suicidal ideas. The patient is not nervous/anxious.     Vital Signs: BP 110/75   Pulse 91   Temp 98.4 F (36.9 C)   Resp 16   Ht 5\' 3"  (1.6 m)   Wt 181 lb 12.8 oz (82.5 kg)   SpO2 99%   BMI 32.20 kg/m    Physical Exam Vitals reviewed.  Constitutional:      General: She is not in acute distress.    Appearance: Normal appearance. She is not ill-appearing.  HENT:     Head: Normocephalic and atraumatic.  Eyes:     Pupils: Pupils are equal, round, and reactive to light.  Cardiovascular:     Rate and Rhythm: Normal rate and regular rhythm.  Pulmonary:     Effort: Pulmonary effort is  normal. No respiratory distress.  Abdominal:     General: There is no distension.     Tenderness: There is no abdominal tenderness. There is no guarding.     Hernia: No hernia is present.  Musculoskeletal:        General: Normal range of motion.     Comments: Mild swelling in both lower extremities  Skin:    General: Skin is warm and dry.  Neurological:     Mental Status: She is alert and oriented to person, place, and time.  Psychiatric:        Mood and Affect: Mood normal.        Behavior: Behavior normal.        Assessment/Plan: 1. Attention deficit disorder (ADD) in adult May continue adderall as before - amphetamine-dextroamphetamine (ADDERALL) 20 MG tablet; Take 1 tablet (20 mg total) by mouth  2 (two) times daily.  Dispense: 60 tablet; Refill: 0 - amphetamine-dextroamphetamine (ADDERALL) 20 MG tablet; Take 1 tablet (20 mg total) by mouth 2 (two) times daily.  Dispense: 60 tablet; Refill: 0 - amphetamine-dextroamphetamine (ADDERALL) 20 MG tablet; Take 1 tablet (20 mg total) by mouth 2 (two) times daily.  Dispense: 60 tablet; Refill: 0 [place] Controlled Substance Database was reviewed by me for overdose risk score (ORS) Refilled Controlled medications today. Reviewed risks and possible side effects associated with taking Stimulants. Combination of these drugs with other psychotropic medications could cause dizziness and drowsiness. Pt needs to Monitor symptoms and exercise caution in driving and operating heavy machinery to avoid damages to oneself, to others and to the surroundings. Patient verbalized understanding in this matter. Dependence and abuse for these drugs will be monitored closely. A Controlled substance policy and procedure is on file which allows [facility] to order a urine drug screen test at any visit. Patient understands and agrees with the plan..  2. Polyarthralgia Will check labs - ANA w/Reflex if Positive - Sed Rate (ESR) - Rheumatoid Factor -  CYCLIC CITRUL PEPTIDE ANTIBODY, IGG/IGA  3. Swelling Will check labs, may need to consider echo in future - ANA w/Reflex if Positive - Sed Rate (ESR) - Rheumatoid Factor - CYCLIC CITRUL PEPTIDE ANTIBODY, IGG/IGA  4. Diabetes mellitus screening - Hgb A1C w/o eAG   General Counseling: Torin verbalizes understanding of the findings of todays visit and agrees with plan of treatment. I have discussed any further diagnostic evaluation that may be needed or ordered today. We also reviewed her medications today. she has been encouraged to call the office with any questions or concerns that should arise related to todays visit.    Orders Placed This Encounter  Procedures   ANA w/Reflex if Positive   Sed Rate (ESR)   Rheumatoid Factor   CYCLIC CITRUL PEPTIDE ANTIBODY, IGG/IGA   Hgb A1C w/o eAG    Meds ordered this encounter  Medications   amphetamine-dextroamphetamine (ADDERALL) 20 MG tablet    Sig: Take 1 tablet (20 mg total) by mouth 2 (two) times daily.    Dispense:  60 tablet    Refill:  0   amphetamine-dextroamphetamine (ADDERALL) 20 MG tablet    Sig: Take 1 tablet (20 mg total) by mouth 2 (two) times daily.    Dispense:  60 tablet    Refill:  0   amphetamine-dextroamphetamine (ADDERALL) 20 MG tablet    Sig: Take 1 tablet (20 mg total) by mouth 2 (two) times daily.    Dispense:  60 tablet    Refill:  0    This patient was seen by Lynn Ito, PA-C in collaboration with Dr. Beverely Risen as a part of collaborative care agreement.   Total time spent:30 Minutes Time spent includes review of chart, medications, test results, and follow up plan with the patient.      Dr Lyndon Code Internal medicine

## 2023-03-20 ENCOUNTER — Other Ambulatory Visit
Admission: RE | Admit: 2023-03-20 | Discharge: 2023-03-20 | Disposition: A | Discharge status: Home / Self Care | Payer: 59 | Attending: Physician Assistant | Admitting: Physician Assistant

## 2023-03-20 ENCOUNTER — Other Ambulatory Visit: Payer: Self-pay | Admitting: Medical Genetics

## 2023-03-20 DIAGNOSIS — R609 Edema, unspecified: Secondary | ICD-10-CM | POA: Diagnosis not present

## 2023-03-20 DIAGNOSIS — M2550 Pain in unspecified joint: Secondary | ICD-10-CM | POA: Diagnosis not present

## 2023-03-20 DIAGNOSIS — Z131 Encounter for screening for diabetes mellitus: Secondary | ICD-10-CM | POA: Diagnosis not present

## 2023-03-20 DIAGNOSIS — Z006 Encounter for examination for normal comparison and control in clinical research program: Secondary | ICD-10-CM

## 2023-03-20 LAB — HEMOGLOBIN A1C
Hgb A1c MFr Bld: 4.7 % — ABNORMAL LOW (ref 4.8–5.6)
Mean Plasma Glucose: 88.19 mg/dL

## 2023-03-20 LAB — SEDIMENTATION RATE: Sed Rate: 12 mm/h (ref 0–20)

## 2023-03-22 LAB — RHEUMATOID FACTOR: Rheumatoid fact SerPl-aCnc: <10 [IU]/mL (ref <14.0)

## 2023-03-23 DIAGNOSIS — J328 Other chronic sinusitis: Secondary | ICD-10-CM | POA: Diagnosis not present

## 2023-03-23 DIAGNOSIS — J302 Other seasonal allergic rhinitis: Secondary | ICD-10-CM | POA: Diagnosis not present

## 2023-03-23 LAB — CYCLIC CITRUL PEPTIDE ANTIBODY, IGG/IGA: CCP Antibodies IgG/IgA: 6 U (ref 0–19)

## 2023-03-23 NOTE — Group Note (Deleted)

## 2023-03-26 LAB — ANTINUCLEAR ANTIBODIES, IFA: ANA Ab, IFA: NEGATIVE

## 2023-03-27 ENCOUNTER — Telehealth: Payer: Self-pay

## 2023-03-27 NOTE — Telephone Encounter (Signed)
-----   Message from Carlean Jews sent at 03/27/2023 12:47 PM EDT ----- Please let her know that her inflammatory labs were normal and A1c was actually borderline low therefore not prediabetic/diabetic

## 2023-03-27 NOTE — Telephone Encounter (Signed)
Sent MyChart message to patient regarding normal labs.

## 2023-04-09 DIAGNOSIS — D2371 Other benign neoplasm of skin of right lower limb, including hip: Secondary | ICD-10-CM | POA: Diagnosis not present

## 2023-04-09 DIAGNOSIS — D2271 Melanocytic nevi of right lower limb, including hip: Secondary | ICD-10-CM | POA: Diagnosis not present

## 2023-04-15 DIAGNOSIS — J301 Allergic rhinitis due to pollen: Secondary | ICD-10-CM | POA: Diagnosis not present

## 2023-04-15 DIAGNOSIS — J328 Other chronic sinusitis: Secondary | ICD-10-CM | POA: Diagnosis not present

## 2023-04-23 ENCOUNTER — Encounter: Payer: Self-pay | Admitting: Physician Assistant

## 2023-04-23 ENCOUNTER — Telehealth (INDEPENDENT_AMBULATORY_CARE_PROVIDER_SITE_OTHER): Payer: 59 | Admitting: Physician Assistant

## 2023-04-23 VITALS — Ht 63.0 in | Wt 175.0 lb

## 2023-04-23 DIAGNOSIS — J0101 Acute recurrent maxillary sinusitis: Secondary | ICD-10-CM | POA: Diagnosis not present

## 2023-04-23 MED ORDER — AZITHROMYCIN 250 MG PO TABS
ORAL_TABLET | ORAL | 0 refills | Status: AC
Start: 2023-04-23 — End: 2023-04-28

## 2023-04-23 NOTE — Telephone Encounter (Signed)
Spoke with pt make her virtual visit

## 2023-04-23 NOTE — Progress Notes (Signed)
[facility] [address] [place], [place] [address]  Internal MEDICINE  Telephone Visit  Patient Name: Paula Sullivan  [id number]  [id number]  Date of Service: 04/23/2023  I connected with the patient at 8:50 by telephone and verified the patients identity using two identifiers.   I discussed the limitations, risks, security and privacy concerns of performing an evaluation and management service by telephone and the availability of in person appointments. I also discussed with the patient that there may be a patient responsible charge related to the service.  The patient expressed understanding and agrees to proceed.    Chief Complaint  Patient presents with   Telephone Assessment    Covid is negative    Telephone Screen    [phone]  telephone    Sinusitis   Headache    HPI Pt is here for virtual sick visit -She is having sinus congestion and pressure again and is prone to recurrent sinusitis for which she was referred to ENT -pt reports that ENT did CT of sinuses and was ok. She is doing allergy testing next week. -They think allergy shots may help with limiting the frequency of her sinus infections -they did give her a new nasal spray and allergy pill to start once testing is done, however she is off her allergy medication now prior to testing and may be contributing to congestion currently  Current Medication: Outpatient Encounter Medications as of 04/23/2023  Medication Sig   amoxicillin-clavulanate (AUGMENTIN) 875-125 MG tablet Take 1 tablet by mouth 2 (two) times daily.   amphetamine-dextroamphetamine (ADDERALL) 20 MG tablet Take 1 tablet (20 mg total) by mouth 2 (two) times daily.   [START ON 05/17/2023] amphetamine-dextroamphetamine (ADDERALL) 20 MG tablet Take 1 tablet (20 mg total) by mouth 2 (two) times daily.   amphetamine-dextroamphetamine (ADDERALL) 20 MG tablet Take 1 tablet (20 mg total) by mouth 2 (two) times daily.   azithromycin (ZITHROMAX) 250  MG tablet Take 2 tablets on day 1, then 1 tablet daily on days 2 through 5   cetirizine (ZYRTEC) 10 MG tablet Take 1 tablet (10 mg total) by mouth daily.   cholecalciferol (VITAMIN D3) 25 MCG (1000 UNIT) tablet Take 1 tablet (1,000 Units total) by mouth daily.   diclofenac (VOLTAREN) 75 MG EC tablet TAKE 1 TO 2 TABLETS BY MOUTH DAILY AS NEEDED FOR PAIN   EPIPEN 2-PAK 0.3 MG/0.3ML SOAJ injection as directed Injection as directed for 30 days   levocetirizine (XYZAL) 5 MG tablet 1 tablet in the evening Orally Once a day for 30 days   linaclotide (LINZESS) 145 MCG CAPS capsule Take 1 capsule (145 mcg total) by mouth daily before breakfast.   ofloxacin (OCUFLOX) 0.3 % ophthalmic solution Place 1 drop into the left eye 4 (four) times daily.   omeprazole (PRILOSEC) 40 MG capsule Take 1 capsule (40 mg total) by mouth daily.   [DISCONTINUED] norgestimate-ethinyl estradiol (ORTHO-CYCLEN) 0.25-35 MG-MCG tablet Take 1 tablet by mouth daily.   No facility-administered encounter medications on file as of 04/23/2023.    Surgical History: Past Surgical History:  Procedure Laterality Date   CHOLECYSTECTOMY     GALLBLADDER SURGERY  01/2005   LAPAROSCOPIC BILATERAL SALPINGECTOMY Bilateral 06/13/2021   Procedure: LAPAROSCOPIC BILATERAL SALPINGECTOMY;  Surgeon: Nadara Mustard, MD;  Location: [facility] ORS;  Service: Gynecology;  Laterality: Bilateral;   SLEEVE GASTROPLASTY      Medical History: Past Medical History:  Diagnosis Date   ADHD    Anemia    PONV (  postoperative nausea and vomiting)    Type AB blood, Rh negative     Family History: Family History  Problem Relation Age of Onset   Suicidality Father        2011   Diabetes Maternal Grandfather    Cancer Mother 9       hyst--? cervical    Social History   Socioeconomic History   Marital status: Single    Spouse name: Not on file   Number of children: 1   Years of education: Not on file   Highest education level: Not on file   Occupational History   Not on file  Tobacco Use   Smoking status: Never   Smokeless tobacco: Never  Vaping Use   Vaping status: Never Used  Substance and Sexual Activity   Alcohol use: Not Currently    Comment: Rarely   Drug use: No   Sexual activity: Yes    Birth control/protection: None  Other Topics Concern   Not on file  Social History Narrative   Not on file   Social Determinants of Health   Financial Resource Strain: Not on file  Food Insecurity: Not on file  Transportation Needs: Not on file  Physical Activity: Not on file  Stress: Not on file  Social Connections: Not on file  Intimate Partner Violence: Not on file      Review of Systems  Constitutional:  Negative for fatigue and fever.  HENT:  Positive for congestion, rhinorrhea, sinus pressure and sinus pain. Negative for mouth sores.   Respiratory:  Negative for chest tightness, shortness of breath and wheezing.   Cardiovascular: Negative.  Negative for chest pain and palpitations.  Gastrointestinal:  Negative for constipation, diarrhea, nausea and vomiting.  Genitourinary:  Negative for flank pain.  Neurological:  Positive for headaches.  Psychiatric/Behavioral: Negative.      Vital Signs: Ht 5\' 3"  (1.6 m)   Wt 175 lb (79.4 kg)   BMI 31.00 kg/m    Observation/Objective:  Pt is able to carry out conversation   Assessment/Plan: 1. Acute recurrent maxillary sinusitis Will start on zpak and pt will follow up with ENT as planned for allergy testing and treatment course - azithromycin (ZITHROMAX) 250 MG tablet; Take 2 tablets on day 1, then 1 tablet daily on days 2 through 5  Dispense: 6 tablet; Refill: 0   General Counseling: Jalysa verbalizes understanding of the findings of today's phone visit and agrees with plan of treatment. I have discussed any further diagnostic evaluation that may be needed or ordered today. We also reviewed her medications today. she has been encouraged to call the office  with any questions or concerns that should arise related to todays visit.    No orders of the defined types were placed in this encounter.   Meds ordered this encounter  Medications   azithromycin (ZITHROMAX) 250 MG tablet    Sig: Take 2 tablets on day 1, then 1 tablet daily on days 2 through 5    Dispense:  6 tablet    Refill:  0    Time spent:25 Minutes    Dr Lyndon Code Internal medicine

## 2023-04-24 ENCOUNTER — Ambulatory Visit: Payer: Self-pay

## 2023-04-27 ENCOUNTER — Telehealth: Payer: Self-pay | Admitting: Physician Assistant

## 2023-04-27 ENCOUNTER — Ambulatory Visit (INDEPENDENT_AMBULATORY_CARE_PROVIDER_SITE_OTHER): Payer: 59 | Admitting: Physician Assistant

## 2023-04-27 VITALS — BP 120/80 | HR 82 | Temp 97.8°F | Resp 16 | Ht 63.0 in | Wt 180.0 lb

## 2023-04-27 DIAGNOSIS — G2581 Restless legs syndrome: Secondary | ICD-10-CM | POA: Diagnosis not present

## 2023-04-27 DIAGNOSIS — G4710 Hypersomnia, unspecified: Secondary | ICD-10-CM

## 2023-04-27 DIAGNOSIS — D2271 Melanocytic nevi of right lower limb, including hip: Secondary | ICD-10-CM | POA: Diagnosis not present

## 2023-04-27 DIAGNOSIS — D485 Neoplasm of uncertain behavior of skin: Secondary | ICD-10-CM | POA: Diagnosis not present

## 2023-04-27 DIAGNOSIS — R600 Localized edema: Secondary | ICD-10-CM

## 2023-04-27 NOTE — Telephone Encounter (Signed)
SS order faxed to [facility]; [phone]

## 2023-04-27 NOTE — Progress Notes (Signed)
[facility] [address] [place], [place] [address]  Internal MEDICINE  Office Visit Note  Patient Name: Paula Sullivan  [id number]  [id number]  Date of Service: 04/27/2023  Chief Complaint  Patient presents with   Follow-up   Anemia    HPI Pt is here for routine follow up -On ABX currently for sinus infection, is off allergy meds currently due to upcoming allergy testing with ENT this week -labs normal, does not explain joint pain or LE swelling. Had discussed checking echo due to this and will order now. -Pt was also supposed to have Sleep study years ago but then covid hit and did not happen. Does have insomnia, feels fatigued during daytime, low concentration without adderall, possible snoring. Does have RLS symptoms as well which may contribute. Will order sleep study. Uses OTC sleep aid currently  EPWORTH SLEEPINESS SCALE:  Scale:  (0)= no chance of dozing; (1)= slight chance of dozing; (2)= moderate chance of dozing; (3)= high chance of dozing  Chance  Situtation    Sitting and reading: 3    Watching TV: 3    Sitting Inactive in public: 0    As a passenger in car: 0      Lying down to rest: 2    Sitting and talking: 0    Sitting quielty after lunch: 0    In a car, stopped in traffic: 0   TOTAL SCORE:   8 out of 24   Current Medication: Outpatient Encounter Medications as of 04/27/2023  Medication Sig   amoxicillin-clavulanate (AUGMENTIN) 875-125 MG tablet Take 1 tablet by mouth 2 (two) times daily.   amphetamine-dextroamphetamine (ADDERALL) 20 MG tablet Take 1 tablet (20 mg total) by mouth 2 (two) times daily.   [START ON 05/17/2023] amphetamine-dextroamphetamine (ADDERALL) 20 MG tablet Take 1 tablet (20 mg total) by mouth 2 (two) times daily.   amphetamine-dextroamphetamine (ADDERALL) 20 MG tablet Take 1 tablet (20 mg total) by mouth 2 (two) times daily.   azithromycin (ZITHROMAX) 250 MG tablet Take 2 tablets on day 1, then 1 tablet daily on  days 2 through 5   cetirizine (ZYRTEC) 10 MG tablet Take 1 tablet (10 mg total) by mouth daily.   cholecalciferol (VITAMIN D3) 25 MCG (1000 UNIT) tablet Take 1 tablet (1,000 Units total) by mouth daily.   diclofenac (VOLTAREN) 75 MG EC tablet TAKE 1 TO 2 TABLETS BY MOUTH DAILY AS NEEDED FOR PAIN   EPIPEN 2-PAK 0.3 MG/0.3ML SOAJ injection as directed Injection as directed for 30 days   levocetirizine (XYZAL) 5 MG tablet 1 tablet in the evening Orally Once a day for 30 days   linaclotide (LINZESS) 145 MCG CAPS capsule Take 1 capsule (145 mcg total) by mouth daily before breakfast.   ofloxacin (OCUFLOX) 0.3 % ophthalmic solution Place 1 drop into the left eye 4 (four) times daily.   omeprazole (PRILOSEC) 40 MG capsule Take 1 capsule (40 mg total) by mouth daily.   [DISCONTINUED] norgestimate-ethinyl estradiol (ORTHO-CYCLEN) 0.25-35 MG-MCG tablet Take 1 tablet by mouth daily.   No facility-administered encounter medications on file as of 04/27/2023.    Surgical History: Past Surgical History:  Procedure Laterality Date   CHOLECYSTECTOMY     GALLBLADDER SURGERY  01/2005   LAPAROSCOPIC BILATERAL SALPINGECTOMY Bilateral 06/13/2021   Procedure: LAPAROSCOPIC BILATERAL SALPINGECTOMY;  Surgeon: Nadara Mustard, MD;  Location: [facility] OR[facility];  Service: Gynecology;  Laterality: Bilateral;   SLEEVE GASTROPLASTY      Medical History: Past Medical History:  Diagnosis Date   ADHD    Anemia    PONV (postoperative nausea and vomiting)    Type AB blood, Rh negative     Family History: Family History  Problem Relation Age of Onset   Suicidality Father        2011   Diabetes Maternal Grandfather    Cancer Mother 46       hyst--? cervical    Social History   Socioeconomic History   Marital status: Single    Spouse name: Not on file   Number of children: 1   Years of education: Not on file   Highest education level: Not on file  Occupational History   Not on file  Tobacco Use   Smoking  status: Never   Smokeless tobacco: Never  Vaping Use   Vaping status: Never Used  Substance and Sexual Activity   Alcohol use: Not Currently    Comment: Rarely   Drug use: No   Sexual activity: Yes    Birth control/protection: None  Other Topics Concern   Not on file  Social History Narrative   Not on file   Social Determinants of Health   Financial Resource Strain: Not on file  Food Insecurity: Not on file  Transportation Needs: Not on file  Physical Activity: Not on file  Stress: Not on file  Social Connections: Not on file  Intimate Partner Violence: Not on file      Review of Systems  Constitutional:  Negative for chills, fatigue and unexpected weight change.  HENT:  Positive for congestion and postnasal drip. Negative for rhinorrhea, sneezing and sore throat.   Eyes:  Negative for redness.  Respiratory:  Negative for cough, chest tightness and shortness of breath.   Cardiovascular:  Positive for leg swelling. Negative for chest pain and palpitations.  Gastrointestinal:  Negative for abdominal pain, constipation, diarrhea, nausea and vomiting.  Genitourinary:  Negative for dysuria and frequency.  Musculoskeletal:  Positive for arthralgias. Negative for back pain and neck pain.  Skin:  Negative for rash.  Neurological: Negative.  Negative for tremors and numbness.  Hematological:  Negative for adenopathy. Does not bruise/bleed easily.  Psychiatric/Behavioral:  Positive for sleep disturbance. Negative for behavioral problems (Depression) and suicidal ideas. The patient is not nervous/anxious.     Vital Signs: BP 120/80   Pulse 82   Temp 97.8 F (36.6 C)   Resp 16   Ht 5\' 3"  (1.6 m)   Wt 180 lb (81.6 kg)   SpO2 95%   BMI 31.89 kg/m    Physical Exam Vitals and nursing note reviewed.  Constitutional:      General: She is not in acute distress.    Appearance: Normal appearance. She is not ill-appearing.  HENT:     Head: Normocephalic and atraumatic.  Eyes:      Pupils: Pupils are equal, round, and reactive to light.  Cardiovascular:     Rate and Rhythm: Normal rate and regular rhythm.  Pulmonary:     Effort: Pulmonary effort is normal. No respiratory distress.  Abdominal:     General: There is no distension.     Tenderness: There is no abdominal tenderness. There is no guarding.     Hernia: No hernia is present.  Musculoskeletal:        General: Normal range of motion.     Comments: Mild swelling in both lower extremities  Skin:    General: Skin is warm and dry.  Neurological:  Mental Status: She is alert and oriented to person, place, and time.  Psychiatric:        Mood and Affect: Mood normal.        Behavior: Behavior normal.        Assessment/Plan: 1. Bilateral lower extremity edema Will order echo for further evaluation - ECHOCARDIOGRAM COMPLETE; Future  2. Hypersomnia Due to insomnia, poor sleep quality with several awakenings, RLS, daytime sleepiness, possible snoring, and elevated BMI will order sleep study - PSG SLEEP STUDY; Future  3. RLS (restless legs syndrome) Will evaluate further with PSG in lab - PSG SLEEP STUDY; Future   General Counseling: Gwendy verbalizes understanding of the findings of todays visit and agrees with plan of treatment. I have discussed any further diagnostic evaluation that may be needed or ordered today. We also reviewed her medications today. she has been encouraged to call the office with any questions or concerns that should arise related to todays visit.    Orders Placed This Encounter  Procedures   ECHOCARDIOGRAM COMPLETE   PSG SLEEP STUDY    No orders of the defined types were placed in this encounter.   This patient was seen by Lynn Ito, PA-C in collaboration with Dr. Beverely Risen as a part of collaborative care agreement.   Total time spent:30 Minutes Time spent includes review of chart, medications, test results, and follow up plan with the patient.       Dr Lyndon Code Internal medicine

## 2023-04-28 ENCOUNTER — Telehealth: Payer: Self-pay | Admitting: Physician Assistant

## 2023-04-28 DIAGNOSIS — J301 Allergic rhinitis due to pollen: Secondary | ICD-10-CM | POA: Diagnosis not present

## 2023-04-28 NOTE — Telephone Encounter (Signed)
Lvm notifying patient of echo appointment date, arrival time, location-Paula Sullivan

## 2023-05-04 DIAGNOSIS — J301 Allergic rhinitis due to pollen: Secondary | ICD-10-CM | POA: Diagnosis not present

## 2023-05-11 ENCOUNTER — Ambulatory Visit: Payer: 59 | Admitting: Obstetrics and Gynecology

## 2023-05-12 ENCOUNTER — Ambulatory Visit: Payer: 59 | Admitting: Obstetrics and Gynecology

## 2023-05-12 NOTE — Progress Notes (Deleted)
PCP:  Carlean Jews, PA-C   No chief complaint on file.    HPI:      Paula Sullivan is a 37 y.o. [phone] whose LMP was No LMP recorded., presents today for her annual examination.  Her menses are regular monthly again (had been irreg after bariatric surg), lasting 1-7 days. If 1 day, then has light spotting only; if for 7 days, then has mod to heavy flow (no anemia on 1/23 labs, taking MVI). No BTB, Dysmenorrhea mild to mod, occurring first 1-2 days of flow, uses heating pad/NSAIDs with relief.   Sex activity: single partner, contraception--s/p lap BS 12/22 with Dr Tiburcio Pea. No pain/bleeding.   Last Pap: 05/06/22 Resuls were NILM/neg HPV DNA. Repeat pap in 3 yrs per ASCCP guidelines 02/26/21 Results were neg cells, NEG HPV DNA  02/15/20 Results were neg cells, POS HPV DNA.  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 There is no FH of ovarian cancer. The patient does self-breast exams.  Tobacco use: The patient denies current or previous tobacco use. Alcohol use: rare No drug use.  Exercise: moderately active  She does get adequate calcium and Vitamin D in her diet. Labs with PCP.  Pt saw GI for ext hemorrhoid with pain/bleeding with wiping. Had hem and fissure, given supp but sx persisting. Pt notices pus sometimes with wiping now, still has intermittent bleeding after BM. Using prep H daily and doing sitz baths. Has loose, narrow stools, feels like she has to strain. Taking vits s/p bariatric surg.   Past Medical History:  Diagnosis Date   ADHD    Anemia    PONV (postoperative nausea and vomiting)    Type AB blood, Rh negative     Past Surgical History:  Procedure Laterality Date   CHOLECYSTECTOMY     GALLBLADDER SURGERY  01/2005   LAPAROSCOPIC BILATERAL SALPINGECTOMY Bilateral 06/13/2021    Procedure: LAPAROSCOPIC BILATERAL SALPINGECTOMY;  Surgeon: Nadara Mustard, MD;  Location: [facility] OR[facility];  Service: Gynecology;  Laterality: Bilateral;   SLEEVE GASTROPLASTY      Family History  Problem Relation Age of Onset   Suicidality Father        2011   Diabetes Maternal Grandfather    Cancer Mother 25       hyst--? cervical    Social History   Socioeconomic History   Marital status: Single    Spouse name: Not on file   Number of children: 1   Years of education: Not on file   Highest education level: Not on file  Occupational History   Not on file  Tobacco Use   Smoking status: Never   Smokeless tobacco: Never  Vaping Use   Vaping status: Never Used  Substance and Sexual Activity   Alcohol use: Not Currently    Comment: Rarely   Drug use: No   Sexual activity: Yes    Birth control/protection: None  Other Topics Concern   Not on file  Social History Narrative   Not on file   Social Determinants of Health   Financial Resource Strain: Not on file  Food Insecurity: Not on file  Transportation Needs: Not on file  Physical Activity: Not on file  Stress: Not on file  Social Connections: Not on file  Intimate Partner Violence:  Not on file     Current Outpatient Medications:    amoxicillin-clavulanate (AUGMENTIN) 875-125 MG tablet, Take 1 tablet by mouth 2 (two) times daily., Disp: 20 tablet, Rfl: 0   amphetamine-dextroamphetamine (ADDERALL) 20 MG tablet, Take 1 tablet (20 mg total) by mouth 2 (two) times daily., Disp: 60 tablet, Rfl: 0   [START ON 05/17/2023] amphetamine-dextroamphetamine (ADDERALL) 20 MG tablet, Take 1 tablet (20 mg total) by mouth 2 (two) times daily., Disp: 60 tablet, Rfl: 0   amphetamine-dextroamphetamine (ADDERALL) 20 MG tablet, Take 1 tablet (20 mg total) by mouth 2 (two) times daily., Disp: 60 tablet, Rfl: 0   cetirizine (ZYRTEC) 10 MG tablet, Take 1 tablet (10 mg total) by mouth daily., Disp: 90 tablet, Rfl: 3   cholecalciferol (VITAMIN D3)  25 MCG (1000 UNIT) tablet, Take 1 tablet (1,000 Units total) by mouth daily., Disp: 90 tablet, Rfl: 1   diclofenac (VOLTAREN) 75 MG EC tablet, TAKE 1 TO 2 TABLETS BY MOUTH DAILY AS NEEDED FOR PAIN, Disp: 60 tablet, Rfl: 3   EPIPEN 2-PAK 0.3 MG/0.3ML SOAJ injection, as directed Injection as directed for 30 days, Disp: , Rfl:    levocetirizine (XYZAL) 5 MG tablet, 1 tablet in the evening Orally Once a day for 30 days, Disp: , Rfl:    linaclotide (LINZESS) 145 MCG CAPS capsule, Take 1 capsule (145 mcg total) by mouth daily before breakfast., Disp: 30 capsule, Rfl: 2   ofloxacin (OCUFLOX) 0.3 % ophthalmic solution, Place 1 drop into the left eye 4 (four) times daily., Disp: 5 mL, Rfl: 0   omeprazole (PRILOSEC) 40 MG capsule, Take 1 capsule (40 mg total) by mouth daily., Disp: 30 capsule, Rfl: 3    ROS:  Review of Systems  Constitutional:  Positive for fatigue. Negative for fever and unexpected weight change.  Respiratory:  Negative for cough, shortness of breath and wheezing.   Cardiovascular:  Negative for chest pain, palpitations and leg swelling.  Gastrointestinal:  Positive for constipation, diarrhea and nausea. Negative for blood in stool and vomiting.  Endocrine: Negative for cold intolerance, heat intolerance and polyuria.  Genitourinary:  Negative for dyspareunia, dysuria, flank pain, frequency, genital sores, hematuria, menstrual problem, pelvic pain, urgency, vaginal bleeding, vaginal discharge and vaginal pain.  Musculoskeletal:  Negative for back pain, joint swelling and myalgias.  Skin:  Negative for rash.  Neurological:  Positive for headaches. Negative for dizziness, syncope, light-headedness and numbness.  Hematological:  Negative for adenopathy.  Psychiatric/Behavioral:  Positive for agitation. Negative for confusion, sleep disturbance and suicidal ideas. The patient is not nervous/anxious.    BREAST: No symptoms   Objective: There were no vitals taken for this  visit.   Physical Exam Constitutional:      Appearance: She is well-developed.  Genitourinary:     Vulva normal.     Genitourinary Comments: NO EXT HEMORRHOIDS/NO FISSURE     Right Labia: No rash, tenderness or lesions.    Left Labia: No tenderness, lesions or rash.    No vaginal discharge, erythema or tenderness.      Right Adnexa: not tender and no mass present.    Left Adnexa: not tender and no mass present.    No cervical friability or polyp.     Uterus is not enlarged or tender.  Rectum:     Tenderness present.     No anal fissure or external hemorrhoid.  Breasts:    Right: No mass, nipple discharge, skin change or tenderness.     Left: No  mass, nipple discharge, skin change or tenderness.  Neck:     Thyroid: No thyromegaly.  Cardiovascular:     Rate and Rhythm: Normal rate and regular rhythm.     Heart sounds: Normal heart sounds. No murmur heard. Pulmonary:     Effort: Pulmonary effort is normal.     Breath sounds: Normal breath sounds.  Abdominal:     Palpations: Abdomen is soft.     Tenderness: There is no abdominal tenderness. There is no guarding or rebound.  Musculoskeletal:        General: Normal range of motion.     Cervical back: Normal range of motion.  Lymphadenopathy:     Cervical: No cervical adenopathy.  Neurological:     General: No focal deficit present.     Mental Status: She is alert and oriented to person, place, and time.     Cranial Nerves: No cranial nerve deficit.  Skin:    General: Skin is warm and dry.  Psychiatric:        Mood and Affect: Mood normal.        Behavior: Behavior normal.        Thought Content: Thought content normal.        Judgment: Judgment normal.  Vitals reviewed.     Assessment/Plan: Encounter for annual routine gynecological examination  Cervical cancer screening - Plan: Cytology - PAP   Screening for STD (sexually transmitted disease) - Plan: Cytology - PAP  Screening for HPV (human papillomavirus) -  Plan: Cytology - PAP  Dysplasia of cervix, low grade (CIN 1) - Plan: repeat pap today  Cutaneous abscess of other site - Plan: doxycycline (VIBRAMYCIN) 100 MG capsule; perianal area. Rx doxy, sitz baths. If sx persist, pt needs to f/u with GI.   Need for immunization against influenza - Plan: Flu Vaccine QUAD 19mo+IM (Fluarix, Fluzone & Alfiuria Quad PF)  No orders of the defined types were placed in this encounter.     GYN counsel adequate intake of calcium and vitamin D, diet and exercise     F/U  No follow-ups on file.  Laneta Guerin B. Judeen Geralds, PA-C 05/12/2023 10:50 AM

## 2023-05-14 ENCOUNTER — Other Ambulatory Visit: Payer: Self-pay

## 2023-05-14 MED ORDER — FLULAVAL 0.5 ML IM SUSY
0.5000 mL | PREFILLED_SYRINGE | Freq: Once | INTRAMUSCULAR | 0 refills | Status: AC
  Filled 2023-05-14: qty 0.5, 1d supply, fill #0

## 2023-05-17 ENCOUNTER — Other Ambulatory Visit: Payer: Self-pay | Admitting: Gastroenterology

## 2023-05-18 ENCOUNTER — Telehealth: Payer: Self-pay | Admitting: Physician Assistant

## 2023-05-18 DIAGNOSIS — J301 Allergic rhinitis due to pollen: Secondary | ICD-10-CM | POA: Diagnosis not present

## 2023-05-18 NOTE — Telephone Encounter (Signed)
Last office visit 12/31/2022 Bloating  Plan: Paula Sullivan is a 37 y.o. female with cholecystectomy in 2006, history of gastric sleeve in 2022 is seen in consultation for chronic constipation, abdominal bloating and epigastric discomfort   Trial of Linzess 145 mcg daily, samples provided H. pylori breath test to evaluate for epigastric discomfort     Follow up as needed  Last refill 01/20/2023 2 refills

## 2023-05-18 NOTE — Telephone Encounter (Signed)
Per Rosey Bath w/ [facility], they did not receive SS order.Faxed again, attention Kim-[name]

## 2023-05-21 DIAGNOSIS — J301 Allergic rhinitis due to pollen: Secondary | ICD-10-CM | POA: Diagnosis not present

## 2023-05-22 ENCOUNTER — Ambulatory Visit
Admission: RE | Admit: 2023-05-22 | Discharge: 2023-05-22 | Disposition: A | Discharge status: Home / Self Care | Payer: 59 | Source: Ambulatory Visit | Attending: Physician Assistant

## 2023-05-22 DIAGNOSIS — R600 Localized edema: Secondary | ICD-10-CM | POA: Insufficient documentation

## 2023-05-22 LAB — ECHOCARDIOGRAM COMPLETE
AR max vel: 3.3 cm2
AV Area VTI: 3.83 cm2
AV Area mean vel: 2.87 cm2
AV Mean grad: 2 mm[Hg]
AV Peak grad: 4 mm[Hg]
Ao pk vel: 1 m/s
Area-P 1/2: 4.24 cm2
MV VTI: 3.92 cm2
S' Lateral: 2.8 cm

## 2023-05-22 NOTE — Progress Notes (Signed)
*  PRELIMINARY RESULTS* Echocardiogram 2D Echocardiogram has been performed.  Cristela Blue 05/22/2023, 9:37 AM

## 2023-05-25 DIAGNOSIS — J301 Allergic rhinitis due to pollen: Secondary | ICD-10-CM | POA: Diagnosis not present

## 2023-05-26 NOTE — Progress Notes (Unsigned)
PCP:  Carlean Jews, PA-C   No chief complaint on file.    HPI:      [name] is a 37 y.o. [phone] whose LMP was No LMP recorded., presents today for her annual examination.  Her menses are regular monthly again (had been irreg after bariatric surg), lasting 1-7 days. If 1 day, then has light spotting only; if for 7 days, then has mod to heavy flow (no anemia on 1/23 labs, taking MVI). No BTB, Dysmenorrhea mild to mod, occurring first 1-2 days of flow, uses heating pad/NSAIDs with relief.   Sex activity: single partner, contraception--s/p lap BS 12/22 with Dr Tiburcio Pea. No pain/bleeding.   Last Pap: 05/06/22 Resuls were NILM/neg HPV DNA. Repeat pap in 3 yrs per ASCCP guidelines 02/26/21 Results were neg cells, NEG HPV DNA  02/15/20 Results were neg cells, POS HPV DNA.  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 There is no FH of ovarian cancer. The patient does self-breast exams.  Tobacco use: The patient denies current or previous tobacco use. Alcohol use: rare No drug use.  Exercise: moderately active  She does get adequate calcium and Vitamin D in her diet. Labs with PCP.  Pt saw GI for ext hemorrhoid with pain/bleeding with wiping. Had hem and fissure, given supp but sx persisting. Pt notices pus sometimes with wiping now, still has intermittent bleeding after BM. Using prep H daily and doing sitz baths. Has loose, narrow stools, feels like she has to strain. Taking vits s/p bariatric surg.   Past Medical History:  Diagnosis Date   ADHD    Anemia    PONV (postoperative nausea and vomiting)    Type AB blood, Rh negative     Past Surgical History:  Procedure Laterality Date   CHOLECYSTECTOMY     GALLBLADDER SURGERY  01/2005   LAPAROSCOPIC BILATERAL SALPINGECTOMY Bilateral 06/13/2021    Procedure: LAPAROSCOPIC BILATERAL SALPINGECTOMY;  Surgeon: Nadara Mustard, MD;  Location: [facility] ORS;  Service: Gynecology;  Laterality: Bilateral;   SLEEVE GASTROPLASTY      Family History  Problem Relation Age of Onset   Suicidality Father        2011   Diabetes Maternal Grandfather    Cancer Mother 40       hyst--? cervical    Social History   Socioeconomic History   Marital status: Single    Spouse name: Not on file   Number of children: 1   Years of education: Not on file   Highest education level: Not on file  Occupational History   Not on file  Tobacco Use   Smoking status: Never   Smokeless tobacco: Never  Vaping Use   Vaping status: Never Used  Substance and Sexual Activity   Alcohol use: Not Currently    Comment: Rarely   Drug use: No   Sexual activity: Yes    Birth control/protection: None  Other Topics Concern   Not on file  Social History Narrative   Not on file   Social Determinants of Health   Financial Resource Strain: Not on file  Food Insecurity: Not on file  Transportation Needs: Not on file  Physical Activity: Not on file  Stress: Not on file  Social Connections: Not on file  Intimate Partner Violence:  Not on file     Current Outpatient Medications:    amoxicillin-clavulanate (AUGMENTIN) 875-125 MG tablet, Take 1 tablet by mouth 2 (two) times daily., Disp: 20 tablet, Rfl: 0   amphetamine-dextroamphetamine (ADDERALL) 20 MG tablet, Take 1 tablet (20 mg total) by mouth 2 (two) times daily., Disp: 60 tablet, Rfl: 0   amphetamine-dextroamphetamine (ADDERALL) 20 MG tablet, Take 1 tablet (20 mg total) by mouth 2 (two) times daily., Disp: 60 tablet, Rfl: 0   amphetamine-dextroamphetamine (ADDERALL) 20 MG tablet, Take 1 tablet (20 mg total) by mouth 2 (two) times daily., Disp: 60 tablet, Rfl: 0   cetirizine (ZYRTEC) 10 MG tablet, Take 1 tablet (10 mg total) by mouth daily., Disp: 90 tablet, Rfl: 3   cholecalciferol (VITAMIN D3) 25 MCG (1000 UNIT)  tablet, Take 1 tablet (1,000 Units total) by mouth daily., Disp: 90 tablet, Rfl: 1   diclofenac (VOLTAREN) 75 MG EC tablet, TAKE 1 TO 2 TABLETS BY MOUTH DAILY AS NEEDED FOR PAIN, Disp: 60 tablet, Rfl: 3   EPIPEN 2-PAK 0.3 MG/0.3ML SOAJ injection, as directed Injection as directed for 30 days, Disp: , Rfl:    levocetirizine (XYZAL) 5 MG tablet, 1 tablet in the evening Orally Once a day for 30 days, Disp: , Rfl:    linaclotide (LINZESS) 145 MCG CAPS capsule, TAKE 1 CAPSULE BY MOUTH DAILY BEFORE BREAKFAST., Disp: 90 capsule, Rfl: 1   ofloxacin (OCUFLOX) 0.3 % ophthalmic solution, Place 1 drop into the left eye 4 (four) times daily., Disp: 5 mL, Rfl: 0   omeprazole (PRILOSEC) 40 MG capsule, Take 1 capsule (40 mg total) by mouth daily., Disp: 30 capsule, Rfl: 3    ROS:  Review of Systems  Constitutional:  Positive for fatigue. Negative for fever and unexpected weight change.  Respiratory:  Negative for cough, shortness of breath and wheezing.   Cardiovascular:  Negative for chest pain, palpitations and leg swelling.  Gastrointestinal:  Positive for constipation, diarrhea and nausea. Negative for blood in stool and vomiting.  Endocrine: Negative for cold intolerance, heat intolerance and polyuria.  Genitourinary:  Negative for dyspareunia, dysuria, flank pain, frequency, genital sores, hematuria, menstrual problem, pelvic pain, urgency, vaginal bleeding, vaginal discharge and vaginal pain.  Musculoskeletal:  Negative for back pain, joint swelling and myalgias.  Skin:  Negative for rash.  Neurological:  Positive for headaches. Negative for dizziness, syncope, light-headedness and numbness.  Hematological:  Negative for adenopathy.  Psychiatric/Behavioral:  Positive for agitation. Negative for confusion, sleep disturbance and suicidal ideas. The patient is not nervous/anxious.    BREAST: No symptoms   Objective: There were no vitals taken for this visit.   Physical Exam Constitutional:       Appearance: She is well-developed.  Genitourinary:     Vulva normal.     Genitourinary Comments: NO EXT HEMORRHOIDS/NO FISSURE     Right Labia: No rash, tenderness or lesions.    Left Labia: No tenderness, lesions or rash.    No vaginal discharge, erythema or tenderness.      Right Adnexa: not tender and no mass present.    Left Adnexa: not tender and no mass present.    No cervical friability or polyp.     Uterus is not enlarged or tender.  Rectum:     Tenderness present.     No anal fissure or external hemorrhoid.  Breasts:    Right: No mass, nipple discharge, skin change or tenderness.     Left: No mass, nipple discharge, skin change or  tenderness.  Neck:     Thyroid: No thyromegaly.  Cardiovascular:     Rate and Rhythm: Normal rate and regular rhythm.     Heart sounds: Normal heart sounds. No murmur heard. Pulmonary:     Effort: Pulmonary effort is normal.     Breath sounds: Normal breath sounds.  Abdominal:     Palpations: Abdomen is soft.     Tenderness: There is no abdominal tenderness. There is no guarding or rebound.  Musculoskeletal:        General: Normal range of motion.     Cervical back: Normal range of motion.  Lymphadenopathy:     Cervical: No cervical adenopathy.  Neurological:     General: No focal deficit present.     Mental Status: She is alert and oriented to person, place, and time.     Cranial Nerves: No cranial nerve deficit.  Skin:    General: Skin is warm and dry.  Psychiatric:        Mood and Affect: Mood normal.        Behavior: Behavior normal.        Thought Content: Thought content normal.        Judgment: Judgment normal.  Vitals reviewed.     Assessment/Plan: Encounter for annual routine gynecological examination  Cervical cancer screening - Plan: Cytology - PAP   Screening for STD (sexually transmitted disease) - Plan: Cytology - PAP  Screening for HPV (human papillomavirus) - Plan: Cytology - PAP  Dysplasia of cervix, low  grade (CIN 1) - Plan: repeat pap today  Cutaneous abscess of other site - Plan: doxycycline (VIBRAMYCIN) 100 MG capsule; perianal area. Rx doxy, sitz baths. If sx persist, pt needs to f/u with GI.   Need for immunization against influenza - Plan: Flu Vaccine QUAD 72mo+IM (Fluarix, Fluzone & Alfiuria Quad PF)  No orders of the defined types were placed in this encounter.     GYN counsel adequate intake of calcium and vitamin D, diet and exercise     F/U  No follow-ups on file.  Reuben Knoblock B. Prachi Oftedahl, PA-C 05/26/2023 1:47 PM

## 2023-05-27 ENCOUNTER — Other Ambulatory Visit: Payer: Self-pay | Admitting: Physician Assistant

## 2023-05-27 DIAGNOSIS — R140 Abdominal distension (gaseous): Secondary | ICD-10-CM

## 2023-05-28 ENCOUNTER — Ambulatory Visit: Payer: 59 | Admitting: Obstetrics and Gynecology

## 2023-05-28 ENCOUNTER — Encounter: Payer: Self-pay | Admitting: Obstetrics and Gynecology

## 2023-05-28 VITALS — BP 126/71 | HR 92 | Ht 63.0 in | Wt 181.0 lb

## 2023-05-28 DIAGNOSIS — Z01419 Encounter for gynecological examination (general) (routine) without abnormal findings: Secondary | ICD-10-CM

## 2023-05-28 DIAGNOSIS — B9689 Other specified bacterial agents as the cause of diseases classified elsewhere: Secondary | ICD-10-CM | POA: Diagnosis not present

## 2023-05-28 DIAGNOSIS — Z01411 Encounter for gynecological examination (general) (routine) with abnormal findings: Secondary | ICD-10-CM

## 2023-05-28 DIAGNOSIS — N939 Abnormal uterine and vaginal bleeding, unspecified: Secondary | ICD-10-CM

## 2023-05-28 DIAGNOSIS — N920 Excessive and frequent menstruation with regular cycle: Secondary | ICD-10-CM | POA: Insufficient documentation

## 2023-05-28 DIAGNOSIS — N760 Acute vaginitis: Secondary | ICD-10-CM

## 2023-05-28 DIAGNOSIS — J301 Allergic rhinitis due to pollen: Secondary | ICD-10-CM | POA: Diagnosis not present

## 2023-05-28 MED ORDER — METRONIDAZOLE 0.75 % VA GEL
1.0000 | Freq: Every day | VAGINAL | 1 refills | Status: AC
Start: 2023-05-28 — End: 2023-06-02

## 2023-05-28 NOTE — Patient Instructions (Signed)
I value your feedback and you entrusting us with your care. If you get a [facility] patient survey, I would appreciate you taking the time to let us know about your experience today. Thank you! ? ? ?

## 2023-06-01 DIAGNOSIS — J301 Allergic rhinitis due to pollen: Secondary | ICD-10-CM | POA: Diagnosis not present

## 2023-06-03 DIAGNOSIS — J301 Allergic rhinitis due to pollen: Secondary | ICD-10-CM | POA: Diagnosis not present

## 2023-06-04 DIAGNOSIS — J301 Allergic rhinitis due to pollen: Secondary | ICD-10-CM | POA: Diagnosis not present

## 2023-06-08 ENCOUNTER — Ambulatory Visit (INDEPENDENT_AMBULATORY_CARE_PROVIDER_SITE_OTHER): Payer: 59 | Admitting: Physician Assistant

## 2023-06-08 DIAGNOSIS — R319 Hematuria, unspecified: Secondary | ICD-10-CM

## 2023-06-08 DIAGNOSIS — N390 Urinary tract infection, site not specified: Secondary | ICD-10-CM | POA: Diagnosis not present

## 2023-06-08 LAB — POCT URINALYSIS DIPSTICK
Glucose, UA: NEGATIVE
Leukocytes, UA: NEGATIVE
Nitrite, UA: NEGATIVE
Protein, UA: NEGATIVE
Spec Grav, UA: 1.015 (ref 1.010–1.025)
Urobilinogen, UA: 0.2 U/dL
pH, UA: 5 (ref 5.0–8.0)

## 2023-06-08 MED ORDER — NITROFURANTOIN MONOHYD MACRO 100 MG PO CAPS
100.0000 mg | ORAL_CAPSULE | Freq: Two times a day (BID) | ORAL | 0 refills | Status: DC
Start: 2023-06-08 — End: 2023-07-22

## 2023-06-08 NOTE — Progress Notes (Signed)
done

## 2023-06-11 LAB — CULTURE, URINE COMPREHENSIVE

## 2023-06-15 ENCOUNTER — Telehealth: Payer: Self-pay

## 2023-06-15 DIAGNOSIS — J301 Allergic rhinitis due to pollen: Secondary | ICD-10-CM | POA: Diagnosis not present

## 2023-06-15 NOTE — Telephone Encounter (Signed)
Left message for patient regarding urine culture results. 

## 2023-06-15 NOTE — Telephone Encounter (Signed)
-----   Message from Carlean Jews sent at 06/15/2023 12:42 PM EST ----- Please let her know that her culture came back with very low growth for UTI, but she is on appropriate ABX

## 2023-06-18 ENCOUNTER — Ambulatory Visit
Admission: RE | Admit: 2023-06-18 | Discharge: 2023-06-18 | Disposition: A | Discharge status: Home / Self Care | Payer: 59 | Source: Ambulatory Visit | Attending: Obstetrics and Gynecology | Admitting: Obstetrics and Gynecology

## 2023-06-18 ENCOUNTER — Other Ambulatory Visit: Payer: 59

## 2023-06-18 DIAGNOSIS — N920 Excessive and frequent menstruation with regular cycle: Secondary | ICD-10-CM | POA: Diagnosis not present

## 2023-06-18 DIAGNOSIS — N888 Other specified noninflammatory disorders of cervix uteri: Secondary | ICD-10-CM | POA: Diagnosis not present

## 2023-06-18 DIAGNOSIS — N939 Abnormal uterine and vaginal bleeding, unspecified: Secondary | ICD-10-CM | POA: Diagnosis not present

## 2023-06-18 DIAGNOSIS — J301 Allergic rhinitis due to pollen: Secondary | ICD-10-CM | POA: Diagnosis not present

## 2023-06-18 DIAGNOSIS — R9389 Abnormal findings on diagnostic imaging of other specified body structures: Secondary | ICD-10-CM | POA: Diagnosis not present

## 2023-06-19 ENCOUNTER — Other Ambulatory Visit: Payer: Self-pay

## 2023-06-19 ENCOUNTER — Encounter: Payer: Self-pay | Admitting: Physician Assistant

## 2023-06-19 ENCOUNTER — Other Ambulatory Visit: Payer: Self-pay | Admitting: Physician Assistant

## 2023-06-19 MED ORDER — FLUCONAZOLE 150 MG PO TABS: ORAL_TABLET | ORAL | 0 refills | Status: DC

## 2023-06-22 ENCOUNTER — Telehealth: Payer: Self-pay

## 2023-06-22 NOTE — Telephone Encounter (Signed)
LMTRC

## 2023-06-22 NOTE — Telephone Encounter (Signed)
Done. Note on u/s note.

## 2023-06-22 NOTE — Telephone Encounter (Signed)
Pt called triage requesting Helmut Muster to call her about U/S results. Pt aware Helmut Muster will be in the office this afternoon.

## 2023-06-22 NOTE — Telephone Encounter (Signed)
Pt returning ABC's call.  [phone]

## 2023-06-23 ENCOUNTER — Telehealth: Payer: Self-pay | Admitting: Obstetrics and Gynecology

## 2023-06-23 NOTE — Telephone Encounter (Signed)
I contacted the patient via phone to scheduled hysterectomy consult with Dr Valentino Saxon per ABC. No answer, I left message asking the patient to call back for scheduling. Openings with Dr Valentino Saxon is on 07/21/25 at 9:55 am.

## 2023-06-25 DIAGNOSIS — J301 Allergic rhinitis due to pollen: Secondary | ICD-10-CM | POA: Diagnosis not present

## 2023-06-29 DIAGNOSIS — J301 Allergic rhinitis due to pollen: Secondary | ICD-10-CM | POA: Diagnosis not present

## 2023-06-30 ENCOUNTER — Encounter: Payer: Self-pay | Admitting: Nurse Practitioner

## 2023-06-30 ENCOUNTER — Telehealth (INDEPENDENT_AMBULATORY_CARE_PROVIDER_SITE_OTHER): Payer: 59 | Admitting: Nurse Practitioner

## 2023-06-30 VITALS — Resp 16 | Ht 63.0 in | Wt 175.0 lb

## 2023-06-30 DIAGNOSIS — B379 Candidiasis, unspecified: Secondary | ICD-10-CM

## 2023-06-30 DIAGNOSIS — J22 Unspecified acute lower respiratory infection: Secondary | ICD-10-CM

## 2023-06-30 DIAGNOSIS — T3695XA Adverse effect of unspecified systemic antibiotic, initial encounter: Secondary | ICD-10-CM

## 2023-06-30 MED ORDER — FLUCONAZOLE 150 MG PO TABS
150.0000 mg | ORAL_TABLET | Freq: Once | ORAL | 0 refills | Status: AC
Start: 2023-06-30 — End: 2023-06-30

## 2023-06-30 MED ORDER — DOXYCYCLINE HYCLATE 100 MG PO TABS
100.0000 mg | ORAL_TABLET | Freq: Two times a day (BID) | ORAL | 0 refills | Status: AC
Start: 2023-06-30 — End: 2023-07-10

## 2023-06-30 NOTE — Progress Notes (Signed)
[facility] [address] [place], [place] [address]  Internal MEDICINE  Telephone Visit  Patient Name: Paula Sullivan  [id number]  [id number]  Date of Service: 06/30/2023  I connected with the patient at 0905 by telephone and verified the patients identity using two identifiers.   I discussed the limitations, risks, security and privacy concerns of performing an evaluation and management service by telephone and the availability of in person appointments. I also discussed with the patient that there may be a patient responsible charge related to the service.  The patient expressed understanding and agrees to proceed.    Chief Complaint  Patient presents with   Telephone Screen    Last Wednesday very congestion, fever, chills, son has walking pneumonia.    Telephone Assessment    HPI Paula Sullivan presents for a telehealth virtual visit for possible respiratory infection.  Son was recently diagnosed with walking pneumonia Symptoms started 6 days ago for the patient including nasal congestion, chest congestion, fever, chills, sore throat.    Current Medication: Outpatient Encounter Medications as of 06/30/2023  Medication Sig   doxycycline (VIBRA-TABS) 100 MG tablet Take 1 tablet (100 mg total) by mouth 2 (two) times daily for 10 days. Take with food.   fluconazole (DIFLUCAN) 150 MG tablet Take 1 tablet (150 mg total) by mouth once for 1 dose. May take an additional dose after 3 days if still symptomatic.   amphetamine-dextroamphetamine (ADDERALL) 20 MG tablet Take 1 tablet (20 mg total) by mouth 2 (two) times daily.   diclofenac (VOLTAREN) 75 MG EC tablet TAKE 1 TO 2 TABLETS BY MOUTH DAILY AS NEEDED FOR PAIN   EPIPEN 2-PAK 0.3 MG/0.3ML SOAJ injection as directed Injection as directed for 30 days   levocetirizine (XYZAL) 5 MG tablet 1 tablet in the evening Orally Once a day for 30 days   linaclotide (LINZESS) 145 MCG CAPS capsule TAKE 1 CAPSULE BY MOUTH DAILY BEFORE BREAKFAST.    nitrofurantoin, macrocrystal-monohydrate, (MACROBID) 100 MG capsule Take 1 capsule (100 mg total) by mouth 2 (two) times daily.   Olopatadine HCl 0.6 % SOLN SPRAY 2 SPRAYS INTO EACH NOSTRIL TWICE A DAY   omeprazole (PRILOSEC) 40 MG capsule TAKE 1 CAPSULE (40 MG TOTAL) BY MOUTH DAILY.   [DISCONTINUED] fluconazole (DIFLUCAN) 150 MG tablet TAKE 1 TABLET. IF SYMPTOMS PERSIST, TAKE ANOTHER TABLET IN 3 DAYS. REPEAT WITH 3RD TABLET IF NEEDED.   [DISCONTINUED] norgestimate-ethinyl estradiol (ORTHO-CYCLEN) 0.25-35 MG-MCG tablet Take 1 tablet by mouth daily.   No facility-administered encounter medications on file as of 06/30/2023.    Surgical History: Past Surgical History:  Procedure Laterality Date   CHOLECYSTECTOMY     GALLBLADDER SURGERY  01/2005   LAPAROSCOPIC BILATERAL SALPINGECTOMY Bilateral 06/13/2021   Procedure: LAPAROSCOPIC BILATERAL SALPINGECTOMY;  Surgeon: Nadara Mustard, MD;  Location: [facility] ORS;  Service: Gynecology;  Laterality: Bilateral;   SLEEVE GASTROPLASTY      Medical History: Past Medical History:  Diagnosis Date   ADHD    Anemia    PONV (postoperative nausea and vomiting)    Type AB blood, Rh negative     Family History: Family History  Problem Relation Age of Onset   Suicidality Father        2011   Diabetes Maternal Grandfather    Cancer Mother 58       hyst--? cervical    Social History   Socioeconomic History   Marital status: Single    Spouse name: Not on file   Number  of children: 1   Years of education: Not on file   Highest education level: Not on file  Occupational History   Not on file  Tobacco Use   Smoking status: Never   Smokeless tobacco: Never  Vaping Use   Vaping status: Never Used  Substance and Sexual Activity   Alcohol use: Yes    Comment: Rarely   Drug use: No   Sexual activity: Yes    Birth control/protection: None  Other Topics Concern   Not on file  Social History Narrative   Not on file   Social Drivers of Health    Financial Resource Strain: Not on file  Food Insecurity: Not on file  Transportation Needs: Not on file  Physical Activity: Not on file  Stress: Not on file  Social Connections: Not on file  Intimate Partner Violence: Not on file      Review of Systems  Constitutional:  Positive for chills, fatigue and fever.  HENT:  Positive for congestion, postnasal drip, sinus pressure, sinus pain and sore throat.   Respiratory:  Positive for cough, chest tightness and wheezing. Negative for shortness of breath.   Cardiovascular: Negative.  Negative for chest pain and palpitations.    Vital Signs: Resp 16   Ht 5\' 3"  (1.6 m)   Wt 175 lb (79.4 kg)   BMI 31.00 kg/m    Observation/Objective: Paula Sullivan is alert and oriented. No acute distress noted.     Assessment/Plan: 1. Acute lower respiratory infection (Primary) Doxycycline prescribed, take until gone  - doxycycline (VIBRA-TABS) 100 MG tablet; Take 1 tablet (100 mg total) by mouth 2 (two) times daily for 10 days. Take with food.  Dispense: 20 tablet; Refill: 0  2. Antibiotic-induced yeast infection Fluconazole prescribed.  - fluconazole (DIFLUCAN) 150 MG tablet; Take 1 tablet (150 mg total) by mouth once for 1 dose. May take an additional dose after 3 days if still symptomatic.  Dispense: 3 tablet; Refill: 0   General Counseling: Paula Sullivan verbalizes understanding of the findings of today's phone visit and agrees with plan of treatment. I have discussed any further diagnostic evaluation that may be needed or ordered today. We also reviewed Paula Sullivan medications today. Paula Sullivan has been encouraged to call the office with any questions or concerns that should arise related to todays visit.  Return if symptoms worsen or fail to improve.   No orders of the defined types were placed in this encounter.   Meds ordered this encounter  Medications   doxycycline (VIBRA-TABS) 100 MG tablet    Sig: Take 1 tablet (100 mg total) by mouth 2 (two) times daily for  10 days. Take with food.    Dispense:  20 tablet    Refill:  0    Fill new script today   fluconazole (DIFLUCAN) 150 MG tablet    Sig: Take 1 tablet (150 mg total) by mouth once for 1 dose. May take an additional dose after 3 days if still symptomatic.    Dispense:  3 tablet    Refill:  0    Time spent:10 Minutes Time spent with patient included reviewing progress notes, labs, imaging studies, and discussing plan for follow up.  [place] Controlled Substance Database was reviewed by me for overdose risk score (ORS) if appropriate.  This patient was seen by Sallyanne Kuster, FNP-C in collaboration with Dr. Beverely Risen as a part of collaborative care agreement.  Cyana Shook R. Tedd Sias, MSN, FNP-C Internal medicine

## 2023-07-01 ENCOUNTER — Telehealth: Payer: Self-pay | Admitting: Physician Assistant

## 2023-07-01 NOTE — Telephone Encounter (Signed)
SS appointment 07/04/23 @ [name]

## 2023-07-09 DIAGNOSIS — J301 Allergic rhinitis due to pollen: Secondary | ICD-10-CM | POA: Diagnosis not present

## 2023-07-16 DIAGNOSIS — J301 Allergic rhinitis due to pollen: Secondary | ICD-10-CM | POA: Diagnosis not present

## 2023-07-20 ENCOUNTER — Telehealth: Payer: Self-pay | Admitting: Physician Assistant

## 2023-07-20 NOTE — Telephone Encounter (Signed)
 Patient was no show for 07/04/23 [name]

## 2023-07-20 NOTE — Telephone Encounter (Signed)
 error

## 2023-07-21 NOTE — Patient Instructions (Signed)
Hysterectomy Information  A hysterectomy is a surgery to take out the womb (uterus) or the womb and the lowest part of the womb (cervix), which opens into the vagina. The ovaries, the fallopian tubes, or both may also be taken out. After the procedure, a woman will no longer have menstrual periods and will not be able to get pregnant. What are the benefits? This surgery may improve your quality of life by relieving pain and heavy vaginal bleeding. This surgery may also: Treat long-term (chronic) infection in the area between your hip bones (pelvis). Treat conditions that affect the womb. Treat cancer of the womb or cervix. Lower the risk of cancer. It may also be done for transgender men to match their gender identity. What are the risks? Generally, this surgery is safe. But problems can happen, including: Bleeding. Needing donated blood (transfusion). Blood clots. Infection. Damage to nearby parts or organs. Allergic reactions to medicines. Needing to switch from a surgery that requires small cuts (incisions) to a surgery that requires a large cut. What are the different types? These are the three types: The top part of the womb is taken out, but not the cervix. The womb and cervix are taken out. The womb, the cervix, and the tissue that holds the womb in place are taken out. What happens during the procedure? This surgery may be done in one of these ways: A cut is made in the belly (abdomen). The womb is taken out through the opening. A cut is made in the vagina. The womb is taken out through the opening that is made. A device with a camera to help see is put through one of 3 or 4 cuts made in the belly. The womb is taken out through the vagina. A device with a camera to help see is put through one of 3 or 4 cuts made in the belly. The womb is cut into pieces and taken out through the openings or through the vagina. A computer helps control the surgical tools put into 3 or 4 cuts  made in the belly. The womb is cut into small pieces. The pieces are taken out through the openings or through the vagina. Talk with your doctor to see which is the right one for you. The procedures may vary among doctors and hospitals. What happens after the procedure? You will be given pain medicine. You may need to stay in the hospital for 1-2 days. You may need to have a responsible adult stay with you for a few days after you go home. If your ovaries were taken out, you may get hot flashes, have night sweats, and have trouble sleeping. Talk with your doctor about how often you need Pap tests. Keep all follow-up visits. Questions to ask your doctor Is this surgery needed? What other options do I have? What organs need to be taken out? How long will I need to stay in the hospital? How long will it take to get better at home? What symptoms can I expect after the procedure? Summary A hysterectomy is a surgery to take out your womb. This may be done to treat conditions. After this surgery, you will not have periods and you cannot get pregnant. There are different types of this surgery. Talk with your doctor about which is best for you. This surgery is safe, but there are some risks. This information is not intended to replace advice given to you by your health care provider. Make sure you discuss any questions   you have with your health care provider. Document Revised: 04/25/2020 Document Reviewed: 04/25/2020 Elsevier Patient Education  2024 [employer]   

## 2023-07-21 NOTE — Progress Notes (Signed)
 GYNECOLOGY PROGRESS NOTE  Subjective:    Patient ID: Paula Sullivan, female    DOB: 10-14-1985, 38 y.o.   MRN: [id number]  HPI  Patient is a 38 y.o. [id number] female who presents for consultation for hysterectomy. Referred frm Alicia Copland, PA-C Notes that since her tubal ligation her cycles have become very irregular (coming every 2-3 weeks), lasting for 1-2 weeks, pain, bloating.  Has had issues with her cycles ever since menarche being somewhat irregular. Has tried multiple types of OCPs over the years, as well an IUD (caused continuous vaginal infections).  Has been off hormones for several years. Uses Advil  and heating pads for her dysmenorrhea. Does also note some dyspareunia. Has history of  bilateral tubal ligation (salpingectomy)   The following portions of the patient's history were reviewed and updated as appropriate:  OB History  Gravida Para Term Preterm AB Living  4 2 2  0 2 2  SAB IAB Ectopic Multiple Live Births  1 0 1 0 2    # Outcome Date GA Lbr Len/2nd Weight Sex Type Anes PTL Lv  4 Term 08/15/16 [redacted]w[redacted]d / 02:31 9 lb 1.3 oz (4.12 kg) M Vag-Spont EPI  LIV     Name: Sullivan,Paula Sullivan     Apgar1: 9  Apgar5: 9  3 Term 2004    F    LIV  2 Ectopic           1 SAB              She  has a past medical history of ADHD, Anemia, PONV (postoperative nausea and vomiting), and Type AB blood, Rh negative.   She  has a past surgical history that includes Gallbladder surgery (01/2005); Cholecystectomy; Sleeve Gastroplasty; and Laparoscopic bilateral salpingectomy (Bilateral, 06/13/2021).   Her family history includes Cancer (age of onset: 72) in her mother; Diabetes in her maternal grandfather; Suicidality in her father.   She  reports that she has never smoked. She has never used smokeless tobacco. She reports current alcohol use. She reports that she does not use drugs.   She has a current medication list which includes the following prescription(s):  amphetamine -dextroamphetamine , diclofenac , epipen  2-pak, levocetirizine, linzess , norethindrone , olopatadine hcl, omeprazole , and [DISCONTINUED] norgestimate -ethinyl estradiol .   She is allergic to latex..  Review of Systems Pertinent items noted in HPI and remainder of comprehensive ROS otherwise negative.   Objective:   Blood pressure 103/71, pulse 81, height 5' 3 (1.6 m), weight 188 lb 6.4 oz (85.5 kg), last menstrual period 06/24/2023.  There is no height or weight on file to calculate BMI. General appearance: alert and no distress Exam deferred today, patient had recent annual exam with pelvic performed by Bernarda Schroeder, PA-C of this practice on 05/28/23, no abnormalities noted then.     Imaging:  US  PELVIC COMPLETE WITH TRANSVAGINAL : PROCEDURE: US  PELVIS COMPLETE WITH TRANSVAGINAL  HISTORY: Patient is a 38 y/o F with AUB/menorrhagia. LMP 05/24/2023.  COMPARISON: None.  TECHNIQUE: Two-dimensional transabdominal grayscale and color Doppler ultrasound of the pelvis was performed. Transvaginal was performed.  FINDINGS: The uterus is anteverted in position and measures 10.8 x 6.1 x 8.3 cm. It demonstrates a heterogeneous echotexture with a 2.8 cm hyperechoic heterogeneous area visualized anterior body, abutting the endometrium. This may represent an ill-defined fibroid but remains indeterminate. The endometrium is thickened measuring 1.7 cm and demonstrates a mildly heterogeneous echotexture with a trace amount of fluid. Nabothian cysts are visualized within the cervix.  The right  ovary measures 5.0 x 2.7 x 3.1 cm and demonstrates a 2.4 x 2.0 x 2.2 cm complex lesion. There is normal color Doppler flow. There is a small amount of free fluid visualized adjacent to right ovary.  The left ovary measures 3.1 x 1.4 x 2.2 cm and demonstrates a normal echotexture. There is normal color Doppler flow.  There is a trace amount of fluid present within the  cul-de-sac.  IMPRESSION:  1. Ill-defined heterogeneous region abutting the endometrium. This is indeterminate. Follow-up MRI recommended for further characterization. Endometrial thickening noted. Small amount of fluid within the endometrium noted. 2. Complex lesion right ovary, likely hemorrhagic cyst. This can also be further assessed with MRI.  Thank you for allowing us  to assist in the care of this patient.  Electronically Signed   By: Lynwood Mains M.D.   On: 06/20/2023 11:01   Assessment:   1. Menorrhagia with regular cycle   2. Intramural leiomyoma of uterus   3. Right ovarian cyst   4. H/O bilateral salpingectomy      Plan:   - Discussed management options for abnormal uterine bleeding including NSAIDs (Naproxen ), tranexamic acid (Lysteda), oral progesterone, Depo Provera, Levonogestrel IUD, endometrial ablation or hysterectomy as definitive surgical management.  Discussed risks and benefits of each method.   Patient desires hysterectomy.  Printed patient education handouts were given to the patient to review at home.  Desires surgery in May or June once her child is out of school and can have better management of childcare during her recovery.  Is willing to trial norethindrone  at this time until her surgery.  Option given.  Bleeding precautions reviewed.   - Right ovarian cyst noted on recent ultrasound.  Slightly complex in nature, but is small.  Can follow-up during surgical intervention or sooner if symptoms arise.  May improve with use of the norethindrone .   Patient to follow-up in 3 months for preop visit.  Based on prior exam and ultrasound findings, patient likely a good candidate for LAVH.   A total of 33 minutes were spent during this encounter, including review of previous progress notes, recent imaging and labs, face-to-face with time with patient involving counseling and coordination of care, as well as documentation for current visit.   Archie Savers,  MD [facility]

## 2023-07-22 ENCOUNTER — Encounter: Payer: 59 | Admitting: Obstetrics and Gynecology

## 2023-07-22 ENCOUNTER — Ambulatory Visit (INDEPENDENT_AMBULATORY_CARE_PROVIDER_SITE_OTHER): Payer: Commercial Managed Care - PPO | Admitting: Obstetrics and Gynecology

## 2023-07-22 ENCOUNTER — Encounter: Payer: Self-pay | Admitting: Obstetrics and Gynecology

## 2023-07-22 VITALS — BP 103/71 | HR 81 | Ht 63.0 in | Wt 188.4 lb

## 2023-07-22 DIAGNOSIS — Z9079 Acquired absence of other genital organ(s): Secondary | ICD-10-CM | POA: Diagnosis not present

## 2023-07-22 DIAGNOSIS — D251 Intramural leiomyoma of uterus: Secondary | ICD-10-CM

## 2023-07-22 DIAGNOSIS — N83201 Unspecified ovarian cyst, right side: Secondary | ICD-10-CM | POA: Diagnosis not present

## 2023-07-22 DIAGNOSIS — N920 Excessive and frequent menstruation with regular cycle: Secondary | ICD-10-CM

## 2023-07-22 MED ORDER — NORETHINDRONE ACETATE 5 MG PO TABS
5.0000 mg | ORAL_TABLET | Freq: Every day | ORAL | 3 refills | Status: DC
Start: 2023-07-22 — End: 2023-09-28

## 2023-07-23 DIAGNOSIS — J301 Allergic rhinitis due to pollen: Secondary | ICD-10-CM | POA: Diagnosis not present

## 2023-07-30 DIAGNOSIS — J301 Allergic rhinitis due to pollen: Secondary | ICD-10-CM | POA: Diagnosis not present

## 2023-08-03 DIAGNOSIS — H52223 Regular astigmatism, bilateral: Secondary | ICD-10-CM | POA: Diagnosis not present

## 2023-08-06 DIAGNOSIS — J301 Allergic rhinitis due to pollen: Secondary | ICD-10-CM | POA: Diagnosis not present

## 2023-08-13 ENCOUNTER — Encounter: Payer: Self-pay | Admitting: Nurse Practitioner

## 2023-08-13 ENCOUNTER — Ambulatory Visit (INDEPENDENT_AMBULATORY_CARE_PROVIDER_SITE_OTHER): Payer: Commercial Managed Care - PPO | Admitting: Nurse Practitioner

## 2023-08-13 VITALS — BP 118/80 | HR 83 | Temp 98.4°F | Resp 16 | Ht 63.0 in | Wt 188.4 lb

## 2023-08-13 DIAGNOSIS — J301 Allergic rhinitis due to pollen: Secondary | ICD-10-CM | POA: Diagnosis not present

## 2023-08-13 DIAGNOSIS — J0101 Acute recurrent maxillary sinusitis: Secondary | ICD-10-CM | POA: Diagnosis not present

## 2023-08-13 MED ORDER — AMOXICILLIN-POT CLAVULANATE 875-125 MG PO TABS
1.0000 | ORAL_TABLET | Freq: Two times a day (BID) | ORAL | 0 refills | Status: AC
Start: 2023-08-13 — End: 2023-08-23

## 2023-08-13 NOTE — Progress Notes (Signed)
[facility] [address] [place], [place] [address]  Internal MEDICINE  Office Visit Note  Patient Name: Paula Sullivan  [id number]  [id number]  Date of Service: 08/13/2023  Chief Complaint  Patient presents with   Acute Visit    Possible ear infection     HPI Bobette presents for an acute sick visit for possible ear infection and upper respiratory infection  --onset was a couple of weeks ago with a scratchy throat.  --reports chest tightness, cough with sputum, right ear pain, fever off and on, chills, fatigue, neck feels sore.  --negative for covid.       Current Medication:  Outpatient Encounter Medications as of 08/13/2023  Medication Sig   amoxicillin-clavulanate (AUGMENTIN) 875-125 MG tablet Take 1 tablet by mouth 2 (two) times daily for 10 days.   amphetamine-dextroamphetamine (ADDERALL) 20 MG tablet Take 1 tablet (20 mg total) by mouth 2 (two) times daily.   diclofenac (VOLTAREN) 75 MG EC tablet TAKE 1 TO 2 TABLETS BY MOUTH DAILY AS NEEDED FOR PAIN   EPIPEN 2-PAK 0.3 MG/0.3ML SOAJ injection as directed Injection as directed for 30 days   levocetirizine (XYZAL) 5 MG tablet 1 tablet in the evening Orally Once a day for 30 days   linaclotide (LINZESS) 145 MCG CAPS capsule TAKE 1 CAPSULE BY MOUTH DAILY BEFORE BREAKFAST.   norethindrone (AYGESTIN) 5 MG tablet Take 1 tablet (5 mg total) by mouth daily.   Olopatadine HCl 0.6 % SOLN SPRAY 2 SPRAYS INTO EACH NOSTRIL TWICE A DAY   omeprazole (PRILOSEC) 40 MG capsule TAKE 1 CAPSULE (40 MG TOTAL) BY MOUTH DAILY.   [DISCONTINUED] norgestimate-ethinyl estradiol (ORTHO-CYCLEN) 0.25-35 MG-MCG tablet Take 1 tablet by mouth daily.   No facility-administered encounter medications on file as of 08/13/2023.      Medical History: Past Medical History:  Diagnosis Date   ADHD    Anemia    PONV (postoperative nausea and vomiting)    Type AB blood, Rh negative      Vital Signs: BP 118/80   Pulse 83   Temp 98.4 F (36.9  C)   Resp 16   Ht 5\' 3"  (1.6 m)   Wt 188 lb 6.4 oz (85.5 kg)   LMP 06/24/2023   SpO2 99%   BMI 33.37 kg/m    Review of Systems  Constitutional:  Positive for chills, fatigue and fever.  HENT:  Positive for congestion, ear pain, postnasal drip and sore throat.   Respiratory:  Positive for cough.   Cardiovascular: Negative.  Negative for chest pain and palpitations.  Neurological:  Positive for headaches.    Physical Exam Vitals reviewed.  Constitutional:      General: She is not in acute distress.    Appearance: Normal appearance. She is obese. She is ill-appearing.  HENT:     Head: Normocephalic and atraumatic.     Right Ear: External ear normal. Swelling and tenderness present. Tympanic membrane is erythematous and bulging.     Left Ear: External ear normal. Swelling and tenderness present.     Nose: Mucosal edema, congestion and rhinorrhea present.     Right Turbinates: Swollen and pale.     Left Turbinates: Swollen and pale.     Right Sinus: Maxillary sinus tenderness present.     Left Sinus: Maxillary sinus tenderness present.     Mouth/Throat:     Lips: Pink.     Mouth: Mucous membranes are moist.     Pharynx: Uvula midline. Pharyngeal swelling  and posterior oropharyngeal erythema present.     Tonsils: 3+ on the right. 3+ on the left.  Eyes:     Pupils: Pupils are equal, round, and reactive to light.  Cardiovascular:     Rate and Rhythm: Normal rate and regular rhythm.     Heart sounds: Normal heart sounds. No murmur heard. Pulmonary:     Effort: Pulmonary effort is normal. No respiratory distress.     Breath sounds: Normal breath sounds. No wheezing.  Lymphadenopathy:     Cervical: No cervical adenopathy.  Neurological:     Mental Status: She is alert and oriented to person, place, and time.  Psychiatric:        Mood and Affect: Mood normal.        Behavior: Behavior normal.       Assessment/Plan: 1. Acute recurrent maxillary sinusitis  (Primary) Augmentin prescribed, take until gone  - amoxicillin-clavulanate (AUGMENTIN) 875-125 MG tablet; Take 1 tablet by mouth 2 (two) times daily for 10 days.  Dispense: 20 tablet; Refill: 0   General Counseling: Erum verbalizes understanding of the findings of todays visit and agrees with plan of treatment. I have discussed any further diagnostic evaluation that may be needed or ordered today. We also reviewed her medications today. she has been encouraged to call the office with any questions or concerns that should arise related to todays visit.    Counseling:    No orders of the defined types were placed in this encounter.   Meds ordered this encounter  Medications   amoxicillin-clavulanate (AUGMENTIN) 875-125 MG tablet    Sig: Take 1 tablet by mouth 2 (two) times daily for 10 days.    Dispense:  20 tablet    Refill:  0    Fill new script today    Return if symptoms worsen or fail to improve.  [place] Controlled Substance Database was reviewed by me for overdose risk score (ORS)  Time spent:20 Minutes Time spent with patient included reviewing progress notes, labs, imaging studies, and discussing plan for follow up.   This patient was seen by Sallyanne Kuster, FNP-C in collaboration with Dr. Beverely Risen as a part of collaborative care agreement.  Helmuth Recupero R. Tedd Sias, MSN, FNP-C Internal Medicine

## 2023-08-19 DIAGNOSIS — J301 Allergic rhinitis due to pollen: Secondary | ICD-10-CM | POA: Diagnosis not present

## 2023-08-20 DIAGNOSIS — J301 Allergic rhinitis due to pollen: Secondary | ICD-10-CM | POA: Diagnosis not present

## 2023-08-27 DIAGNOSIS — J301 Allergic rhinitis due to pollen: Secondary | ICD-10-CM | POA: Diagnosis not present

## 2023-09-03 DIAGNOSIS — J301 Allergic rhinitis due to pollen: Secondary | ICD-10-CM | POA: Diagnosis not present

## 2023-09-10 DIAGNOSIS — J301 Allergic rhinitis due to pollen: Secondary | ICD-10-CM | POA: Diagnosis not present

## 2023-09-14 ENCOUNTER — Other Ambulatory Visit: Payer: Self-pay | Admitting: Physician Assistant

## 2023-09-14 DIAGNOSIS — F988 Other specified behavioral and emotional disorders with onset usually occurring in childhood and adolescence: Secondary | ICD-10-CM

## 2023-09-14 MED ORDER — AMPHETAMINE-DEXTROAMPHETAMINE 20 MG PO TABS: 20.0000 mg | ORAL_TABLET | Freq: Two times a day (BID) | ORAL | 0 refills | Status: DC

## 2023-09-14 NOTE — Telephone Encounter (Signed)
 Pt not seen 10/24 and next 09/28/23

## 2023-09-17 DIAGNOSIS — J301 Allergic rhinitis due to pollen: Secondary | ICD-10-CM | POA: Diagnosis not present

## 2023-09-21 ENCOUNTER — Telehealth: Payer: Self-pay | Admitting: Physician Assistant

## 2023-09-21 NOTE — Telephone Encounter (Signed)
 Left vm and sent mychart message to confirm 09/28/23 appointment-[name]

## 2023-09-22 ENCOUNTER — Other Ambulatory Visit: Payer: Self-pay | Admitting: Obstetrics and Gynecology

## 2023-09-22 DIAGNOSIS — N760 Acute vaginitis: Secondary | ICD-10-CM

## 2023-09-23 ENCOUNTER — Encounter: Payer: Self-pay | Admitting: Obstetrics and Gynecology

## 2023-09-24 ENCOUNTER — Other Ambulatory Visit (HOSPITAL_COMMUNITY)
Admission: RE | Admit: 2023-09-24 | Discharge: 2023-09-24 | Disposition: A | Discharge status: Home / Self Care | Source: Ambulatory Visit | Attending: Obstetrics and Gynecology | Admitting: Obstetrics and Gynecology

## 2023-09-24 ENCOUNTER — Ambulatory Visit (INDEPENDENT_AMBULATORY_CARE_PROVIDER_SITE_OTHER)

## 2023-09-24 VITALS — BP 114/76 | HR 90 | Ht 63.0 in | Wt 187.0 lb

## 2023-09-24 DIAGNOSIS — J301 Allergic rhinitis due to pollen: Secondary | ICD-10-CM | POA: Diagnosis not present

## 2023-09-24 DIAGNOSIS — N898 Other specified noninflammatory disorders of vagina: Secondary | ICD-10-CM | POA: Diagnosis present

## 2023-09-24 NOTE — Progress Notes (Signed)
    NURSE VISIT NOTE  Subjective:    Patient ID: Paula Sullivan, female    DOB: December 14, 1985, 38 y.o.   MRN: [id number]  HPI  Patient is a 38 y.o. [id number] female who presents for  vaginal discharge for couple day(s). Denies abnormal vaginal bleeding or significant pelvic pain or fever. denies . Patient denies history of known exposure to STD.   Objective:    BP 114/76   Pulse 90   Ht 5\' 3"  (1.6 m)   Wt 187 lb (84.8 kg)   BMI 33.13 kg/m    Assessment:   1. Vaginal discharge       Plan:   GC and chlamydia DNA  probe sent to lab. Treatment: abstain from coitus during course of treatment ROV prn if symptoms persist or worsen.   Loney Laurence, CMA

## 2023-09-28 ENCOUNTER — Ambulatory Visit (INDEPENDENT_AMBULATORY_CARE_PROVIDER_SITE_OTHER): Payer: Commercial Managed Care - PPO | Admitting: Physician Assistant

## 2023-09-28 ENCOUNTER — Encounter: Payer: Self-pay | Admitting: Physician Assistant

## 2023-09-28 VITALS — BP 120/80 | HR 80 | Temp 98.4°F | Resp 16 | Ht 63.0 in | Wt 188.0 lb

## 2023-09-28 DIAGNOSIS — F988 Other specified behavioral and emotional disorders with onset usually occurring in childhood and adolescence: Secondary | ICD-10-CM

## 2023-09-28 DIAGNOSIS — K219 Gastro-esophageal reflux disease without esophagitis: Secondary | ICD-10-CM

## 2023-09-28 DIAGNOSIS — J301 Allergic rhinitis due to pollen: Secondary | ICD-10-CM | POA: Diagnosis not present

## 2023-09-28 DIAGNOSIS — E66811 Obesity, class 1: Secondary | ICD-10-CM

## 2023-09-28 DIAGNOSIS — Z0001 Encounter for general adult medical examination with abnormal findings: Secondary | ICD-10-CM

## 2023-09-28 LAB — CERVICOVAGINAL ANCILLARY ONLY
Bacterial Vaginitis (gardnerella): POSITIVE — AB
Candida Glabrata: NEGATIVE
Candida Vaginitis: NEGATIVE
Chlamydia: NEGATIVE
Comment: NEGATIVE
Comment: NEGATIVE
Comment: NEGATIVE
Comment: NEGATIVE
Comment: NEGATIVE
Comment: NORMAL
Neisseria Gonorrhea: NEGATIVE
Trichomonas: NEGATIVE

## 2023-09-28 MED ORDER — OMEPRAZOLE 40 MG PO CPDR
40.0000 mg | DELAYED_RELEASE_CAPSULE | Freq: Every day | ORAL | 1 refills | Status: DC
Start: 2023-09-28 — End: 2024-05-24

## 2023-09-28 MED ORDER — LEVOCETIRIZINE DIHYDROCHLORIDE 5 MG PO TABS: 5.0000 mg | ORAL_TABLET | Freq: Every evening | ORAL | 3 refills | Status: AC

## 2023-09-28 MED ORDER — AMPHETAMINE-DEXTROAMPHETAMINE 20 MG PO TABS: 20.0000 mg | ORAL_TABLET | Freq: Two times a day (BID) | ORAL | 0 refills | Status: DC

## 2023-09-28 MED ORDER — TOPIRAMATE 25 MG PO TABS
25.0000 mg | ORAL_TABLET | Freq: Every day | ORAL | 2 refills | Status: DC
Start: 2023-09-28 — End: 2023-11-25

## 2023-09-28 NOTE — Progress Notes (Signed)
 [facility] [address] [place], [place] [address]  Internal MEDICINE  Office Visit Note  Patient Name: Paula Sullivan  [id number]  [id number]  Date of Service: 09/28/2023  Chief Complaint  Patient presents with   Annual Exam   Weight Loss     HPI Pt is here for routine health maintenance examination -Following with GYN for abnormal vaginal bleeding. States they are planning for a hysterectomy due to fibroids and a spot on uterus, to be done in June. Thinks this is contributing to bloating -UTD on pap -Ended up not getting sleep study due to son being in the hospital.  -echo: normal EF, Mild MR and trivial TR -would like to discuss wt loss options as she does not want to regain the wt she lost after wt loss surgery. She is already on adderall for ADHD therefore cannot add another stimulant. Did discuss off-label topamax use vs GLP1 options. She would like to try topamax and will speak with insurance about coverage options -will need future refills for adderall, doing well with this -due for labs--slip given  Current Medication: Outpatient Encounter Medications as of 09/28/2023  Medication Sig   diclofenac (VOLTAREN) 75 MG EC tablet TAKE 1 TO 2 TABLETS BY MOUTH DAILY AS NEEDED FOR PAIN   EPIPEN 2-PAK 0.3 MG/0.3ML SOAJ injection as directed Injection as directed for 30 days   linaclotide (LINZESS) 145 MCG CAPS capsule TAKE 1 CAPSULE BY MOUTH DAILY BEFORE BREAKFAST.   Olopatadine HCl 0.6 % SOLN SPRAY 2 SPRAYS INTO EACH NOSTRIL TWICE A DAY   topiramate (TOPAMAX) 25 MG tablet Take 1 tablet (25 mg total) by mouth daily.   [DISCONTINUED] amphetamine-dextroamphetamine (ADDERALL) 20 MG tablet Take 1 tablet (20 mg total) by mouth 2 (two) times daily.   [DISCONTINUED] levocetirizine (XYZAL) 5 MG tablet 1 tablet in the evening Orally Once a day for 30 days   [DISCONTINUED] norethindrone (AYGESTIN) 5 MG tablet Take 1 tablet (5 mg total) by mouth daily.   [DISCONTINUED]  omeprazole (PRILOSEC) 40 MG capsule TAKE 1 CAPSULE (40 MG TOTAL) BY MOUTH DAILY.   [START ON 10/14/2023] amphetamine-dextroamphetamine (ADDERALL) 20 MG tablet Take 1 tablet (20 mg total) by mouth 2 (two) times daily.   levocetirizine (XYZAL) 5 MG tablet Take 1 tablet (5 mg total) by mouth every evening.   omeprazole (PRILOSEC) 40 MG capsule Take 1 capsule (40 mg total) by mouth daily.   [DISCONTINUED] norgestimate-ethinyl estradiol (ORTHO-CYCLEN) 0.25-35 MG-MCG tablet Take 1 tablet by mouth daily.   No facility-administered encounter medications on file as of 09/28/2023.    Surgical History: Past Surgical History:  Procedure Laterality Date   CHOLECYSTECTOMY     GALLBLADDER SURGERY  01/2005   LAPAROSCOPIC BILATERAL SALPINGECTOMY Bilateral 06/13/2021   Procedure: LAPAROSCOPIC BILATERAL SALPINGECTOMY;  Surgeon: Nadara Mustard, MD;  Location: [facility] ORS;  Service: Gynecology;  Laterality: Bilateral;   SLEEVE GASTROPLASTY      Medical History: Past Medical History:  Diagnosis Date   ADHD    Anemia    PONV (postoperative nausea and vomiting)    Type AB blood, Rh negative     Family History: Family History  Problem Relation Age of Onset   Suicidality Father        2011   Diabetes Maternal Grandfather    Cancer Mother 55       hyst--? cervical      Review of Systems  Constitutional:  Negative for chills, fatigue and unexpected weight change.  HENT:  Negative  for congestion, rhinorrhea, sneezing and sore throat.   Eyes:  Negative for redness.  Respiratory:  Negative for cough, chest tightness and shortness of breath.   Cardiovascular:  Negative for chest pain and palpitations.  Gastrointestinal:  Negative for abdominal pain, constipation, diarrhea, nausea and vomiting.  Genitourinary:  Positive for menstrual problem. Negative for dysuria and frequency.  Musculoskeletal:  Negative for arthralgias, back pain, joint swelling and neck pain.  Skin:  Negative for rash.  Neurological:  Negative.  Negative for tremors and numbness.  Hematological:  Negative for adenopathy. Does not bruise/bleed easily.  Psychiatric/Behavioral:  Negative for behavioral problems (Depression) and suicidal ideas. The patient is not nervous/anxious.      Vital Signs: BP 120/80   Pulse 80   Temp 98.4 F (36.9 C)   Resp 16   Ht 5\' 3"  (1.6 m)   Wt 188 lb (85.3 kg)   SpO2 99%   BMI 33.30 kg/m    Physical Exam Vitals reviewed.  Constitutional:      General: She is not in acute distress.    Appearance: Normal appearance. She is not ill-appearing.  HENT:     Head: Normocephalic and atraumatic.  Eyes:     Pupils: Pupils are equal, round, and reactive to light.  Cardiovascular:     Rate and Rhythm: Normal rate and regular rhythm.  Pulmonary:     Effort: Pulmonary effort is normal. No respiratory distress.  Abdominal:     General: There is no distension.     Tenderness: There is no abdominal tenderness. There is no guarding.     Hernia: No hernia is present.  Musculoskeletal:        General: Normal range of motion.  Skin:    General: Skin is warm and dry.  Neurological:     Mental Status: She is alert and oriented to person, place, and time.  Psychiatric:        Mood and Affect: Mood normal.        Behavior: Behavior normal.      LABS: No results found for this or any previous visit (from the past 2160 hours).      Assessment/Plan: 1. Encounter for general adult medical examination with abnormal findings (Primary) Cpe performed, UTD on PHM, lab slip given  2. Attention deficit disorder (ADD) in adult May continue adderall as before, future refill sent - amphetamine-dextroamphetamine (ADDERALL) 20 MG tablet; Take 1 tablet (20 mg total) by mouth 2 (two) times daily.  Dispense: 60 tablet; Refill: 0 [place] Controlled Substance Database was reviewed by me for overdose risk score (ORS) Refilled Controlled medications today. Reviewed risks and possible side effects associated  with taking Stimulants. Combination of these drugs with other psychotropic medications could cause dizziness and drowsiness. Pt needs to Monitor symptoms and exercise caution in driving and operating heavy machinery to avoid damages to oneself, to others and to the surroundings. Patient verbalized understanding in this matter. Dependence and abuse for these drugs will be monitored closely. A Controlled substance policy and procedure is on file which allows [facility] to order a urine drug screen test at any visit. Patient understands and agrees with the plan..  3. Gastroesophageal reflux disease, unspecified whether esophagitis present - omeprazole (PRILOSEC) 40 MG capsule; Take 1 capsule (40 mg total) by mouth daily.  Dispense: 90 capsule; Refill: 1  4. Non-seasonal allergic rhinitis due to pollen - levocetirizine (XYZAL) 5 MG tablet; Take 1 tablet (5 mg total) by mouth every evening.  Dispense: 90 tablet; Refill: 3 Now seeing ENT/allergist and starting allergy injections as well  5. Obesity (BMI 30.0-34.9) Will work on diet and exercise and trial topamax. Pt may call insurance about GLP1 coverage options as well - topiramate (TOPAMAX) 25 MG tablet; Take 1 tablet (25 mg total) by mouth daily.  Dispense: 30 tablet; Refill: 2   General Counseling: Soha verbalizes understanding of the findings of todays visit and agrees with plan of treatment. I have discussed any further diagnostic evaluation that may be needed or ordered today. We also reviewed her medications today. she has been encouraged to call the office with any questions or concerns that should arise related to todays visit.    Counseling:    No orders of the defined types were placed in this encounter.   Meds ordered this encounter  Medications   omeprazole (PRILOSEC) 40 MG capsule    Sig: Take 1 capsule (40 mg total) by mouth daily.    Dispense:  90 capsule    Refill:  1   levocetirizine (XYZAL) 5 MG tablet     Sig: Take 1 tablet (5 mg total) by mouth every evening.    Dispense:  90 tablet    Refill:  3   topiramate (TOPAMAX) 25 MG tablet    Sig: Take 1 tablet (25 mg total) by mouth daily.    Dispense:  30 tablet    Refill:  2   amphetamine-dextroamphetamine (ADDERALL) 20 MG tablet    Sig: Take 1 tablet (20 mg total) by mouth 2 (two) times daily.    Dispense:  60 tablet    Refill:  0    This patient was seen by Lynn Ito, PA-C in collaboration with Dr. Beverely Risen as a part of collaborative care agreement.  Total time spent:35 Minutes  Time spent includes review of chart, medications, test results, and follow up plan with the patient.     Lyndon Code, MD  Internal Medicine

## 2023-09-29 ENCOUNTER — Encounter: Payer: Self-pay | Admitting: Physician Assistant

## 2023-09-29 ENCOUNTER — Other Ambulatory Visit: Payer: Self-pay | Admitting: Certified Nurse Midwife

## 2023-09-29 ENCOUNTER — Other Ambulatory Visit (HOSPITAL_COMMUNITY): Payer: Self-pay

## 2023-09-29 DIAGNOSIS — B9689 Other specified bacterial agents as the cause of diseases classified elsewhere: Secondary | ICD-10-CM

## 2023-09-29 MED ORDER — METRONIDAZOLE 500 MG PO TABS
500.0000 mg | ORAL_TABLET | Freq: Two times a day (BID) | ORAL | 0 refills | Status: DC
Start: 2023-09-29 — End: 2023-10-05

## 2023-09-30 ENCOUNTER — Other Ambulatory Visit: Payer: Self-pay | Admitting: Physician Assistant

## 2023-09-30 DIAGNOSIS — E782 Mixed hyperlipidemia: Secondary | ICD-10-CM | POA: Diagnosis not present

## 2023-09-30 DIAGNOSIS — Z1329 Encounter for screening for other suspected endocrine disorder: Secondary | ICD-10-CM | POA: Diagnosis not present

## 2023-09-30 DIAGNOSIS — Z0001 Encounter for general adult medical examination with abnormal findings: Secondary | ICD-10-CM | POA: Diagnosis not present

## 2023-09-30 DIAGNOSIS — E538 Deficiency of other specified B group vitamins: Secondary | ICD-10-CM | POA: Diagnosis not present

## 2023-09-30 NOTE — Telephone Encounter (Signed)
 Lmom to pt that she call us back but she need to weight loss injection

## 2023-10-01 DIAGNOSIS — J301 Allergic rhinitis due to pollen: Secondary | ICD-10-CM | POA: Diagnosis not present

## 2023-10-01 LAB — CBC WITH DIFFERENTIAL/PLATELET
Basophils Absolute: 0 10*3/uL (ref 0.0–0.2)
Basos: 1 %
EOS (ABSOLUTE): 0.1 10*3/uL (ref 0.0–0.4)
Eos: 2 %
Hematocrit: 42.6 % (ref 34.0–46.6)
Hemoglobin: 14 g/dL (ref 11.1–15.9)
Immature Grans (Abs): 0 10*3/uL (ref 0.0–0.1)
Immature Granulocytes: 1 %
Lymphocytes Absolute: 1.3 10*3/uL (ref 0.7–3.1)
Lymphs: 29 %
MCH: 29.6 pg (ref 26.6–33.0)
MCHC: 32.9 g/dL (ref 31.5–35.7)
MCV: 90 fL (ref 79–97)
Monocytes Absolute: 0.4 10*3/uL (ref 0.1–0.9)
Monocytes: 9 %
Neutrophils Absolute: 2.6 10*3/uL (ref 1.4–7.0)
Neutrophils: 58 %
Platelets: 230 10*3/uL (ref 150–450)
RBC: 4.73 x10E6/uL (ref 3.77–5.28)
RDW: 12.2 % (ref 11.7–15.4)
WBC: 4.5 10*3/uL (ref 3.4–10.8)

## 2023-10-01 LAB — COMPREHENSIVE METABOLIC PANEL
ALT: 17 IU/L (ref 0–32)
AST: 16 IU/L (ref 0–40)
Albumin: 4.5 g/dL (ref 3.9–4.9)
Alkaline Phosphatase: 57 IU/L (ref 44–121)
BUN/Creatinine Ratio: 19 (ref 9–23)
BUN: 17 mg/dL (ref 6–20)
Bilirubin Total: 0.6 mg/dL (ref 0.0–1.2)
CO2: 22 mmol/L (ref 20–29)
Calcium: 9.2 mg/dL (ref 8.7–10.2)
Chloride: 103 mmol/L (ref 96–106)
Creatinine, Ser: 0.91 mg/dL (ref 0.57–1.00)
Globulin, Total: 2.7 g/dL (ref 1.5–4.5)
Glucose: 88 mg/dL (ref 70–99)
Potassium: 4 mmol/L (ref 3.5–5.2)
Sodium: 139 mmol/L (ref 134–144)
Total Protein: 7.2 g/dL (ref 6.0–8.5)
eGFR: 82 mL/min/{1.73_m2} (ref >59)

## 2023-10-01 LAB — IRON AND TIBC
Iron Saturation: 37 % (ref 15–55)
Iron: 133 ug/dL (ref 27–159)
Total Iron Binding Capacity: 357 ug/dL (ref 250–450)
UIBC: 224 ug/dL (ref 131–425)

## 2023-10-01 LAB — LIPID PANEL WITH LDL/HDL RATIO
Cholesterol, Total: 194 mg/dL (ref 100–199)
HDL: 52 mg/dL (ref >39)
LDL Chol Calc (NIH): 126 mg/dL — ABNORMAL HIGH (ref 0–99)
LDL/HDL Ratio: 2.4 ratio (ref 0.0–3.2)
Triglycerides: 89 mg/dL (ref 0–149)
VLDL Cholesterol Cal: 16 mg/dL (ref 5–40)

## 2023-10-01 LAB — VITAMIN D 25 HYDROXY (VIT D DEFICIENCY, FRACTURES): Vit D, 25-Hydroxy: 29.8 ng/mL — ABNORMAL LOW (ref 30.0–100.0)

## 2023-10-01 LAB — B12 AND FOLATE PANEL
Folate: 9.6 ng/mL (ref >3.0)
Vitamin B-12: 294 pg/mL (ref 232–1245)

## 2023-10-01 LAB — FERRITIN: Ferritin: 31 ng/mL (ref 15–150)

## 2023-10-01 LAB — T4, FREE: Free T4: 1.04 ng/dL (ref 0.82–1.77)

## 2023-10-01 LAB — TSH: TSH: 2.29 u[IU]/mL (ref 0.450–4.500)

## 2023-10-05 MED ORDER — METRONIDAZOLE 0.75 % VA GEL
1.0000 | Freq: Every day | VAGINAL | 1 refills | Status: AC
Start: 2023-10-05 — End: 2023-10-10

## 2023-10-05 NOTE — Addendum Note (Signed)
 Addended by: Kathlene Cote on: 10/05/2023 04:39 PM   Modules accepted: Orders

## 2023-10-08 DIAGNOSIS — J301 Allergic rhinitis due to pollen: Secondary | ICD-10-CM | POA: Diagnosis not present

## 2023-10-15 DIAGNOSIS — J301 Allergic rhinitis due to pollen: Secondary | ICD-10-CM | POA: Diagnosis not present

## 2023-10-22 DIAGNOSIS — J301 Allergic rhinitis due to pollen: Secondary | ICD-10-CM | POA: Diagnosis not present

## 2023-10-26 ENCOUNTER — Encounter: Payer: Self-pay | Admitting: Physician Assistant

## 2023-10-26 ENCOUNTER — Ambulatory Visit: Admitting: Physician Assistant

## 2023-10-26 VITALS — BP 120/70 | HR 77 | Temp 97.8°F | Resp 16 | Ht 63.0 in | Wt 186.0 lb

## 2023-10-26 DIAGNOSIS — R300 Dysuria: Secondary | ICD-10-CM

## 2023-10-26 DIAGNOSIS — E538 Deficiency of other specified B group vitamins: Secondary | ICD-10-CM

## 2023-10-26 DIAGNOSIS — Z79899 Other long term (current) drug therapy: Secondary | ICD-10-CM

## 2023-10-26 DIAGNOSIS — E66811 Obesity, class 1: Secondary | ICD-10-CM

## 2023-10-26 DIAGNOSIS — N898 Other specified noninflammatory disorders of vagina: Secondary | ICD-10-CM

## 2023-10-26 DIAGNOSIS — F988 Other specified behavioral and emotional disorders with onset usually occurring in childhood and adolescence: Secondary | ICD-10-CM

## 2023-10-26 LAB — POCT URINALYSIS DIPSTICK
Bilirubin, UA: NEGATIVE
Blood, UA: NEGATIVE
Glucose, UA: NEGATIVE
Ketones, UA: NEGATIVE
Leukocytes, UA: NEGATIVE
Nitrite, UA: NEGATIVE
Protein, UA: NEGATIVE
Spec Grav, UA: 1.01 (ref 1.010–1.025)
Urobilinogen, UA: 0.2 U/dL
pH, UA: 5 (ref 5.0–8.0)

## 2023-10-26 LAB — POCT URINE DRUG SCREEN
POC Amphetamine UR: POSITIVE — AB
POC BENZODIAZEPINES UR: NOT DETECTED
POC Barbiturate UR: NOT DETECTED
POC Cocaine UR: NOT DETECTED
POC Ecstasy UR: NOT DETECTED
POC Marijuana UR: NOT DETECTED
POC Methadone UR: NOT DETECTED
POC Methamphetamine UR: NOT DETECTED
POC Opiate Ur: NOT DETECTED
POC Oxycodone UR: NOT DETECTED
POC PHENCYCLIDINE UR: NOT DETECTED
POC TRICYCLICS UR: NOT DETECTED

## 2023-10-26 MED ORDER — AMPHETAMINE-DEXTROAMPHETAMINE 20 MG PO TABS: 20.0000 mg | ORAL_TABLET | Freq: Two times a day (BID) | ORAL | 0 refills | Status: DC

## 2023-10-26 MED ORDER — CYANOCOBALAMIN 1000 MCG/ML IJ SOLN
1000.0000 ug | Freq: Once | INTRAMUSCULAR | Status: AC
  Administered 2023-10-26: 1000 ug via INTRAMUSCULAR

## 2023-10-26 NOTE — Progress Notes (Signed)
 [facility] [address] [place], [place] [address]  Internal MEDICINE  Office Visit Note  Patient Name: Paula Sullivan  [id number]  [id number]  Date of Service: 10/26/2023  Chief Complaint  Patient presents with   Follow-up   ADHD    HPI Pt is here for routine follow up -Some itchiness and discharge, possible swelling from vaginal irritation. No frequency. No odor. Will check urine and nuswab. Pt may check in with GYN if further concerns -LDL up some, B12 low, Vit D low--start supplements -tolerating topamax , but some mood swings in afternoon. Down 2 lbs and would like to continue. -will send future adderall refill, doing well with this. No S/E  Current Medication: Outpatient Encounter Medications as of 10/26/2023  Medication Sig   diclofenac  (VOLTAREN ) 75 MG EC tablet TAKE 1 TO 2 TABLETS BY MOUTH DAILY AS NEEDED FOR PAIN   EPIPEN  2-PAK 0.3 MG/0.3ML SOAJ injection as directed Injection as directed for 30 days   levocetirizine (XYZAL ) 5 MG tablet Take 1 tablet (5 mg total) by mouth every evening.   linaclotide  (LINZESS ) 145 MCG CAPS capsule TAKE 1 CAPSULE BY MOUTH DAILY BEFORE BREAKFAST.   Olopatadine HCl 0.6 % SOLN SPRAY 2 SPRAYS INTO EACH NOSTRIL TWICE A DAY   omeprazole  (PRILOSEC) 40 MG capsule Take 1 capsule (40 mg total) by mouth daily.   topiramate  (TOPAMAX ) 25 MG tablet Take 1 tablet (25 mg total) by mouth daily.   [DISCONTINUED] amphetamine -dextroamphetamine  (ADDERALL) 20 MG tablet Take 1 tablet (20 mg total) by mouth 2 (two) times daily.   [START ON 11/12/2023] amphetamine -dextroamphetamine  (ADDERALL) 20 MG tablet Take 1 tablet (20 mg total) by mouth 2 (two) times daily.   [DISCONTINUED] norgestimate -ethinyl estradiol  (ORTHO-CYCLEN) 0.25-35 MG-MCG tablet Take 1 tablet by mouth daily.   [EXPIRED] cyanocobalamin  (VITAMIN B12) injection 1,000 mcg    No facility-administered encounter medications on file as of 10/26/2023.    Surgical History: Past Surgical  History:  Procedure Laterality Date   CHOLECYSTECTOMY     GALLBLADDER SURGERY  01/2005   LAPAROSCOPIC BILATERAL SALPINGECTOMY Bilateral 06/13/2021   Procedure: LAPAROSCOPIC BILATERAL SALPINGECTOMY;  Surgeon: Alben Alma, MD;  Location: [facility] ORS;  Service: Gynecology;  Laterality: Bilateral;   SLEEVE GASTROPLASTY      Medical History: Past Medical History:  Diagnosis Date   ADHD    Anemia    PONV (postoperative nausea and vomiting)    Type AB blood, Rh negative     Family History: Family History  Problem Relation Age of Onset   Suicidality Father        2011   Diabetes Maternal Grandfather    Cancer Mother 23       hyst--? cervical    Social History   Socioeconomic History   Marital status: Single    Spouse name: Not on file   Number of children: 1   Years of education: Not on file   Highest education level: Not on file  Occupational History   Not on file  Tobacco Use   Smoking status: Never   Smokeless tobacco: Never  Vaping Use   Vaping status: Never Used  Substance and Sexual Activity   Alcohol use: Yes    Comment: Rarely   Drug use: No   Sexual activity: Yes    Birth control/protection: None  Other Topics Concern   Not on file  Social History Narrative   Not on file   Social Drivers of Health   Financial Resource Strain: Not on  file  Food Insecurity: Not on file  Transportation Needs: Not on file  Physical Activity: Not on file  Stress: Not on file  Social Connections: Not on file  Intimate Partner Violence: Not on file      Review of Systems  Constitutional:  Negative for chills and unexpected weight change.  HENT:  Negative for congestion, rhinorrhea, sneezing and sore throat.   Eyes:  Negative for redness.  Respiratory:  Negative for cough, chest tightness and shortness of breath.   Cardiovascular:  Negative for chest pain and palpitations.  Gastrointestinal:  Negative for abdominal pain, constipation, diarrhea, nausea and vomiting.   Genitourinary:  Positive for vaginal discharge and vaginal pain. Negative for dysuria and frequency.       Vaginal irritation  Musculoskeletal:  Negative for arthralgias, back pain, joint swelling and neck pain.  Skin:  Negative for rash.  Neurological: Negative.  Negative for tremors and numbness.  Hematological:  Negative for adenopathy. Does not bruise/bleed easily.  Psychiatric/Behavioral:  Negative for behavioral problems (Depression), sleep disturbance and suicidal ideas. The patient is not nervous/anxious.     Vital Signs: BP 120/70   Pulse 77   Temp 97.8 F (36.6 C)   Resp 16   Ht 5\' 3"  (1.6 m)   Wt 186 lb (84.4 kg)   SpO2 98%   BMI 32.95 kg/m    Physical Exam Vitals reviewed.  Constitutional:      General: She is not in acute distress.    Appearance: Normal appearance. She is not ill-appearing.  HENT:     Head: Normocephalic and atraumatic.  Eyes:     Pupils: Pupils are equal, round, and reactive to light.  Cardiovascular:     Rate and Rhythm: Normal rate and regular rhythm.  Pulmonary:     Effort: Pulmonary effort is normal. No respiratory distress.  Skin:    General: Skin is warm and dry.  Neurological:     Mental Status: She is alert and oriented to person, place, and time.  Psychiatric:        Mood and Affect: Mood normal.        Behavior: Behavior normal.        Assessment/Plan: 1. Attention deficit disorder (ADD) in adult (Primary) May continue adderrall as before, refill sent - amphetamine -dextroamphetamine  (ADDERALL) 20 MG tablet; Take 1 tablet (20 mg total) by mouth 2 (two) times daily.  Dispense: 60 tablet; Refill: 0  2. Vaginal discharge Pt performed self-swab and will notify of results, - NuSwab Vaginitis Plus (VG+)  3. B12 deficiency - cyanocobalamin  (VITAMIN B12) injection 1,000 mcg  4. Encounter for long-term (current) use of medications - POCT Urine Drug Screen  5. Dysuria - POCT Urinalysis Dipstick negative  6. Obesity (BMI  30.0-34.9) Down 2lbs since last visit, will continue topamax  and working on diet and exercise   General Counseling: Paula Sullivan verbalizes understanding of the findings of todays visit and agrees with plan of treatment. I have discussed any further diagnostic evaluation that may be needed or ordered today. We also reviewed her medications today. she has been encouraged to call the office with any questions or concerns that should arise related to todays visit.    Orders Placed This Encounter  Procedures   NuSwab Vaginitis Plus (VG+)   POCT Urinalysis Dipstick   POCT Urine Drug Screen    Meds ordered this encounter  Medications   amphetamine -dextroamphetamine  (ADDERALL) 20 MG tablet    Sig: Take 1 tablet (20 mg total) by mouth  2 (two) times daily.    Dispense:  60 tablet    Refill:  0   cyanocobalamin  (VITAMIN B12) injection 1,000 mcg    This patient was seen by Taylor Favia, PA-C in collaboration with Dr. Verneta Gone as a part of collaborative care agreement.   Total time spent:30 Minutes Time spent includes review of chart, medications, test results, and follow up plan with the patient.      Dr Fozia M Khan Internal medicine

## 2023-10-28 LAB — NUSWAB VAGINITIS PLUS (VG+)
Candida albicans, NAA: NEGATIVE
Candida glabrata, NAA: NEGATIVE
Chlamydia trachomatis, NAA: NEGATIVE
Neisseria gonorrhoeae, NAA: NEGATIVE
Trich vag by NAA: NEGATIVE

## 2023-10-29 ENCOUNTER — Telehealth: Payer: Self-pay

## 2023-10-29 DIAGNOSIS — J301 Allergic rhinitis due to pollen: Secondary | ICD-10-CM | POA: Diagnosis not present

## 2023-10-29 NOTE — Telephone Encounter (Signed)
-----   Message from Jacques Mattock sent at 10/28/2023  1:54 PM EDT ----- Please let her know that her swab came back negative

## 2023-10-29 NOTE — Telephone Encounter (Signed)
 LVM for patient regarding normal NuSwab results.

## 2023-10-29 NOTE — Telephone Encounter (Signed)
 Patient notified her nuswab was normal.

## 2023-11-04 ENCOUNTER — Ambulatory Visit (INDEPENDENT_AMBULATORY_CARE_PROVIDER_SITE_OTHER)

## 2023-11-04 DIAGNOSIS — E538 Deficiency of other specified B group vitamins: Secondary | ICD-10-CM | POA: Diagnosis not present

## 2023-11-04 DIAGNOSIS — J301 Allergic rhinitis due to pollen: Secondary | ICD-10-CM | POA: Diagnosis not present

## 2023-11-04 MED ORDER — CYANOCOBALAMIN 1000 MCG/ML IJ SOLN
1000.0000 ug | Freq: Once | INTRAMUSCULAR | Status: AC
  Administered 2023-11-04: 1000 ug via INTRAMUSCULAR

## 2023-11-05 DIAGNOSIS — J301 Allergic rhinitis due to pollen: Secondary | ICD-10-CM | POA: Diagnosis not present

## 2023-11-11 ENCOUNTER — Ambulatory Visit (INDEPENDENT_AMBULATORY_CARE_PROVIDER_SITE_OTHER)

## 2023-11-11 DIAGNOSIS — E538 Deficiency of other specified B group vitamins: Secondary | ICD-10-CM

## 2023-11-11 MED ORDER — CYANOCOBALAMIN 1000 MCG/ML IJ SOLN
1000.0000 ug | Freq: Once | INTRAMUSCULAR | Status: AC
  Administered 2023-11-11: 1000 ug via INTRAMUSCULAR

## 2023-11-12 DIAGNOSIS — N938 Other specified abnormal uterine and vaginal bleeding: Secondary | ICD-10-CM

## 2023-11-12 DIAGNOSIS — N83201 Unspecified ovarian cyst, right side: Secondary | ICD-10-CM

## 2023-11-12 DIAGNOSIS — J301 Allergic rhinitis due to pollen: Secondary | ICD-10-CM | POA: Diagnosis not present

## 2023-11-12 HISTORY — DX: Unspecified ovarian cyst, right side: N83.201

## 2023-11-12 HISTORY — DX: Other specified abnormal uterine and vaginal bleeding: N93.8

## 2023-11-19 DIAGNOSIS — J301 Allergic rhinitis due to pollen: Secondary | ICD-10-CM | POA: Diagnosis not present

## 2023-11-23 ENCOUNTER — Ambulatory Visit: Admitting: Physician Assistant

## 2023-11-25 ENCOUNTER — Ambulatory Visit (INDEPENDENT_AMBULATORY_CARE_PROVIDER_SITE_OTHER): Admitting: Obstetrics and Gynecology

## 2023-11-25 ENCOUNTER — Encounter: Payer: Self-pay | Admitting: Obstetrics and Gynecology

## 2023-11-25 VITALS — BP 129/80 | HR 97 | Ht 63.0 in | Wt 182.6 lb

## 2023-11-25 DIAGNOSIS — Z01818 Encounter for other preprocedural examination: Secondary | ICD-10-CM

## 2023-11-25 DIAGNOSIS — Z9079 Acquired absence of other genital organ(s): Secondary | ICD-10-CM

## 2023-11-25 DIAGNOSIS — N83201 Unspecified ovarian cyst, right side: Secondary | ICD-10-CM

## 2023-11-25 DIAGNOSIS — N920 Excessive and frequent menstruation with regular cycle: Secondary | ICD-10-CM

## 2023-11-25 DIAGNOSIS — D251 Intramural leiomyoma of uterus: Secondary | ICD-10-CM

## 2023-11-25 MED ORDER — METRONIDAZOLE 500 MG PO TABS: 500.0000 mg | ORAL_TABLET | Freq: Two times a day (BID) | ORAL | 0 refills | Status: DC

## 2023-11-25 NOTE — Progress Notes (Signed)
Patient presents today for a pre-op exam prior to hysterectomy. She states no additional concerns today.

## 2023-11-25 NOTE — Progress Notes (Signed)
 PRE-OPERATIVE HISTORY AND PHYSICAL EXAM  PCP:  Jacques Mattock, PA-C Subjective:   HPI:  Paula Sullivan is a 38 y.o. [phone].  No LMP recorded (lmp unknown).  She presents today for a pre-op discussion and PE.  She has the following symptoms: Pelvic pain, dysfunctional bleeding recalcitrant to hormone therapy, uterine fibroids, right ovarian cyst.  Patient initially seen in consultation with Dr. Denman Fischer and they have discussed risks and benefits of surgical options and patient has chosen hysterectomy.  She presents today to schedule surgery.  Review of Systems:   Constitutional: Denied constitutional symptoms, night sweats, recent illness, fatigue, fever, insomnia and weight loss.  Eyes: Denied eye symptoms, eye pain, photophobia, vision change and visual disturbance.  Ears/Nose/Throat/Neck: Denied ear, nose, throat or neck symptoms, hearing loss, nasal discharge, sinus congestion and sore throat.  Cardiovascular: Denied cardiovascular symptoms, arrhythmia, chest pain/pressure, edema, exercise intolerance, orthopnea and palpitations.  Respiratory: Denied pulmonary symptoms, asthma, pleuritic pain, productive sputum, cough, dyspnea and wheezing.  Gastrointestinal: Denied, gastro-esophageal reflux, melena, nausea and vomiting.  Genitourinary: See HPI for additional information.  Musculoskeletal: Denied musculoskeletal symptoms, stiffness, swelling, muscle weakness and myalgia.  Dermatologic: Denied dermatology symptoms, rash and scar.  Neurologic: Denied neurology symptoms, dizziness, headache, neck pain and syncope.  Psychiatric: Denied psychiatric symptoms, anxiety and depression.  Endocrine: Denied endocrine symptoms including hot flashes and night sweats.   OB History  Gravida Para Term Preterm AB Living  4 2 2  2 2   SAB IAB Ectopic Multiple Live Births  1  1 0 2    # Outcome Date GA Lbr Len/2nd Weight Sex Type Anes PTL Lv  4 Term 08/15/16 [redacted]w[redacted]d / 02:31 9 lb 1.3 oz (4.12  kg) M Vag-Spont EPI  LIV  3 Term 2004    F    LIV  2 Ectopic           1 SAB             Past Medical History:  Diagnosis Date   ADHD    Anemia    PONV (postoperative nausea and vomiting)    Type AB blood, Rh negative     Past Surgical History:  Procedure Laterality Date   CHOLECYSTECTOMY     GALLBLADDER SURGERY  01/2005   LAPAROSCOPIC BILATERAL SALPINGECTOMY Bilateral 06/13/2021   Procedure: LAPAROSCOPIC BILATERAL SALPINGECTOMY;  Surgeon: Alben Alma, MD;  Location: [facility] ORS;  Service: Gynecology;  Laterality: Bilateral;   SLEEVE GASTROPLASTY        SOCIAL HISTORY:  Social History   Tobacco Use  Smoking Status Never  Smokeless Tobacco Never   Social History   Substance and Sexual Activity  Alcohol Use Yes   Comment: Rarely    Social History   Substance and Sexual Activity  Drug Use No    Family History  Problem Relation Age of Onset   Suicidality Father        2011   Diabetes Maternal Grandfather    Cancer Mother 44       hyst--? cervical    ALLERGIES:  Latex  MEDS:   Current Outpatient Medications on File Prior to Visit  Medication Sig Dispense Refill   amphetamine -dextroamphetamine  (ADDERALL) 20 MG tablet Take 1 tablet (20 mg total) by mouth 2 (two) times daily. 60 tablet 0   EPIPEN  2-PAK 0.3 MG/0.3ML SOAJ injection as directed Injection as directed for 30 days     levocetirizine (XYZAL ) 5 MG tablet Take 1 tablet (  5 mg total) by mouth every evening. 90 tablet 3   linaclotide  (LINZESS ) 145 MCG CAPS capsule TAKE 1 CAPSULE BY MOUTH DAILY BEFORE BREAKFAST. 90 capsule 1   Olopatadine HCl 0.6 % SOLN SPRAY 2 SPRAYS INTO EACH NOSTRIL TWICE A DAY     omeprazole  (PRILOSEC) 40 MG capsule Take 1 capsule (40 mg total) by mouth daily. 90 capsule 1   [DISCONTINUED] norgestimate -ethinyl estradiol  (ORTHO-CYCLEN) 0.25-35 MG-MCG tablet Take 1 tablet by mouth daily. 1 Package 11   No current facility-administered medications on file prior to visit.    Meds  ordered this encounter  Medications   metroNIDAZOLE  (FLAGYL ) 500 MG tablet    Sig: Take 1 tablet (500 mg total) by mouth 2 (two) times daily. Begin 5 days prior to scheduled surgery as directed.    Dispense:  10 tablet    Refill:  0     Physical examination BP 129/80   Pulse 97   Ht 5\' 3"  (1.6 m)   Wt 182 lb 9.6 oz (82.8 kg)   LMP  (LMP Unknown)   BMI 32.35 kg/m   General NAD, Conversant  HEENT Atraumatic; Op clear with mmm.  Normo-cephalic.  Anicteric sclerae  Thyroid/Neck Smooth without nodularity or enlargement. Normal ROM.  Neck Supple.  Skin No rashes, lesions or ulceration. Normal palpated skin turgor. No nodularity.  Breasts: No masses or discharge.  Symmetric.  No axillary adenopathy.  Lungs: Clear to auscultation.No rales or wheezes. Normal Respiratory effort, no retractions.  Heart: NSR.  No murmurs or rubs appreciated. No peripheral edema  Abdomen: Soft.  Non-tender.  No masses.  No HSM. No hernia  Extremities: Moves all appropriately.  Normal ROM for age. No lymphadenopathy.  Neuro: Oriented to PPT.  Normal mood. Normal affect.     Pelvic:   Vulva: Normal appearance.  No lesions.  Vagina: No lesions or abnormalities noted.  Support: Normal pelvic support.  Urethra No masses tenderness or scarring.  Meatus Normal size without lesions or prolapse.  Cervix: Normal ectropion.  No lesions.  Anus: Normal exam.  No lesions.  Perineum: Normal exam.  No lesions.        Bimanual   Uterus: 10 weeks non-tender.  Mobile.  AV.  Adnexae: No masses.  Non-tender to palpation.  Cul-de-sac: Negative for abnormality.    See ultrasound results revealing fibroid uterus and ovary enlarged with a complex cyst likely hemorrhagic cyst  Assessment:   [id number] Patient Active Problem List   Diagnosis Date Noted   BV (bacterial vaginosis) 05/28/2023   Menorrhagia with regular cycle 05/28/2023   Cough 05/27/2022   Right knee pain 10/28/2021   S/P tubal ligation 06/26/2021    Sterilization consult    Dysplasia of cervix, low grade (CIN 1) 02/27/2021   Cervical high risk human papillomavirus (HPV) DNA test positive 02/15/2020   Abnormal weight gain 01/09/2020   Attention deficit disorder (ADD) in adult 01/09/2020   Primary osteoarthritis of left knee 02/05/2018   Sprain of lateral collateral ligament of left knee 02/05/2018   Tear of medial meniscus of left knee, current 02/05/2018   IUD check up 01/12/2017   Headache 07/23/2016   Lower abdominal pain 07/13/2016    1. Pre-op exam   2. Menorrhagia with regular cycle   3. Intramural leiomyoma of uterus   4. Right ovarian cyst   5. H/O bilateral salpingectomy    She has previously discussed definitive surgery with Dr. Denman Fischer.  She presents today to finalize her plans for surgery.  Plan:   Orders: Meds ordered this encounter  Medications   metroNIDAZOLE  (FLAGYL ) 500 MG tablet    Sig: Take 1 tablet (500 mg total) by mouth 2 (two) times daily. Begin 5 days prior to scheduled surgery as directed.    Dispense:  10 tablet    Refill:  0     1.  LAVH RSO  Pre-op discussions regarding Risks and Benefits of her scheduled surgery.  LAVH The procedure of Laparoscopic Assisted Vaginal Hysterectomy was described to the patient in detail.  We reviewed the rationale for Hysterectomy and the patient was again informed of other nonsurgical management possibilities for her condition.  She has considered these other options, and desires a Hysterectomy.  We have reviewed the fact that Hysterectomy is permanent and that following the procedure she will not be able to become pregnant or bear children.  We have discussed the following risk factors specifically and the patient has also been informed that additional complications not mentioned may develop:  Damage to bowel, bladder, ureters or to other internal organs, bleeding, infection and the risk from anesthesia.  We have discussed the procedure itself in detail and she has  an informed understanding of this surgery.  We have also discussed the recovery period in which physical and sexual activity will be restricted for a varying degree of time, often 3 - 6 weeks. The Laparoscopic Portion of Hysterectomy has also been reviewed with the patient.  She understands how the laparoscope facilitates the procedure.  We have discussed the abdominal incisions and punctures that will be used.  We have also reviewed the increased Operating Room time often accompanying LAVH.  The slightly increased risk of complications secondary to abdominal punctures, and use of laparoscopic instrumentation has also been discussed in detail.I have answered all of her questions and I believe the patient has an informed understanding of the procedure of Laparoscopic Assisted Vaginal Hysterectomy. Oophorectomy The option of Oophorectomy has been discussed with the patient.  Detailed risk/benefits have been reviewed.  The risks discussed include, but are not limited to, hemorrhage, infection, damage to ureter or other internal organ, and Ovarian Remnant Syndrome.  The benefits include a significant decrease in the risk of Ovarian Cancer and in benign Ovarian disease.  The risk of Ovarian CA has been estimated at 1 in 70.  This is a relatively small risk.  However, should Ovarian CA develop, it is often found late in the course of the disease.  We have also discussed the role of inheritance in the development of Ovarian disease.  Some women, who have close relatives with Ovarian CA, have a higher than 1 in 70 risk of Ovarian CA.  The benefits of Estrogen replacement therapy following Oophorectomy has been stressed.  If she is premenopausal, we have discussed the fact that this procedure will make her permanently sterile and that premature menopause will result if no ERT is begun.  I have answered all of her questions, and I believe that she has an adequate and informed understanding of the risks and benefits of  Oophorectomy. She desires right oophorectomy only as this has recently had what appears to be a hemorrhagic cyst on it.  She wants the surgery regardless of current appearance.  Delice Felt, M.D. 11/25/2023 2:06 PM

## 2023-11-25 NOTE — H&P (Signed)
 PRE-OPERATIVE HISTORY AND PHYSICAL EXAM  PCP:  Jacques Mattock, PA-C Subjective:   HPI:  Paula Sullivan is a 38 y.o. [phone].  No LMP recorded (lmp unknown).  She presents today for a pre-op discussion and PE.  She has the following symptoms: Pelvic pain, dysfunctional bleeding recalcitrant to hormone therapy, uterine fibroids, right ovarian cyst.  Patient initially seen in consultation with Dr. Denman Fischer and they have discussed risks and benefits of surgical options and patient has chosen hysterectomy.  She presents today to schedule surgery.  Review of Systems:   Constitutional: Denied constitutional symptoms, night sweats, recent illness, fatigue, fever, insomnia and weight loss.  Eyes: Denied eye symptoms, eye pain, photophobia, vision change and visual disturbance.  Ears/Nose/Throat/Neck: Denied ear, nose, throat or neck symptoms, hearing loss, nasal discharge, sinus congestion and sore throat.  Cardiovascular: Denied cardiovascular symptoms, arrhythmia, chest pain/pressure, edema, exercise intolerance, orthopnea and palpitations.  Respiratory: Denied pulmonary symptoms, asthma, pleuritic pain, productive sputum, cough, dyspnea and wheezing.  Gastrointestinal: Denied, gastro-esophageal reflux, melena, nausea and vomiting.  Genitourinary: See HPI for additional information.  Musculoskeletal: Denied musculoskeletal symptoms, stiffness, swelling, muscle weakness and myalgia.  Dermatologic: Denied dermatology symptoms, rash and scar.  Neurologic: Denied neurology symptoms, dizziness, headache, neck pain and syncope.  Psychiatric: Denied psychiatric symptoms, anxiety and depression.  Endocrine: Denied endocrine symptoms including hot flashes and night sweats.   OB History  Gravida Para Term Preterm AB Living  4 2 2  2 2   SAB IAB Ectopic Multiple Live Births  1  1 0 2    # Outcome Date GA Lbr Len/2nd Weight Sex Type Anes PTL Lv  4 Term 08/15/16 [redacted]w[redacted]d / 02:31 9 lb 1.3 oz (4.12  kg) M Vag-Spont EPI  LIV  3 Term 2004    F    LIV  2 Ectopic           1 SAB             Past Medical History:  Diagnosis Date   ADHD    Anemia    PONV (postoperative nausea and vomiting)    Type AB blood, Rh negative     Past Surgical History:  Procedure Laterality Date   CHOLECYSTECTOMY     GALLBLADDER SURGERY  01/2005   LAPAROSCOPIC BILATERAL SALPINGECTOMY Bilateral 06/13/2021   Procedure: LAPAROSCOPIC BILATERAL SALPINGECTOMY;  Surgeon: Alben Alma, MD;  Location: [facility] ORS;  Service: Gynecology;  Laterality: Bilateral;   SLEEVE GASTROPLASTY        SOCIAL HISTORY:  Social History   Tobacco Use  Smoking Status Never  Smokeless Tobacco Never   Social History   Substance and Sexual Activity  Alcohol Use Yes   Comment: Rarely    Social History   Substance and Sexual Activity  Drug Use No    Family History  Problem Relation Age of Onset   Suicidality Father        2011   Diabetes Maternal Grandfather    Cancer Mother 58       hyst--? cervical    ALLERGIES:  Latex  MEDS:   Current Outpatient Medications on File Prior to Visit  Medication Sig Dispense Refill   amphetamine -dextroamphetamine  (ADDERALL) 20 MG tablet Take 1 tablet (20 mg total) by mouth 2 (two) times daily. 60 tablet 0   EPIPEN  2-PAK 0.3 MG/0.3ML SOAJ injection as directed Injection as directed for 30 days     levocetirizine (XYZAL ) 5 MG tablet Take 1 tablet (  5 mg total) by mouth every evening. 90 tablet 3   linaclotide  (LINZESS ) 145 MCG CAPS capsule TAKE 1 CAPSULE BY MOUTH DAILY BEFORE BREAKFAST. 90 capsule 1   Olopatadine HCl 0.6 % SOLN SPRAY 2 SPRAYS INTO EACH NOSTRIL TWICE A DAY     omeprazole  (PRILOSEC) 40 MG capsule Take 1 capsule (40 mg total) by mouth daily. 90 capsule 1   [DISCONTINUED] norgestimate -ethinyl estradiol  (ORTHO-CYCLEN) 0.25-35 MG-MCG tablet Take 1 tablet by mouth daily. 1 Package 11   No current facility-administered medications on file prior to visit.    Meds  ordered this encounter  Medications   metroNIDAZOLE  (FLAGYL ) 500 MG tablet    Sig: Take 1 tablet (500 mg total) by mouth 2 (two) times daily. Begin 5 days prior to scheduled surgery as directed.    Dispense:  10 tablet    Refill:  0     Physical examination BP 129/80   Pulse 97   Ht 5\' 3"  (1.6 m)   Wt 182 lb 9.6 oz (82.8 kg)   LMP  (LMP Unknown)   BMI 32.35 kg/m   General NAD, Conversant  HEENT Atraumatic; Op clear with mmm.  Normo-cephalic.  Anicteric sclerae  Thyroid/Neck Smooth without nodularity or enlargement. Normal ROM.  Neck Supple.  Skin No rashes, lesions or ulceration. Normal palpated skin turgor. No nodularity.  Breasts: No masses or discharge.  Symmetric.  No axillary adenopathy.  Lungs: Clear to auscultation.No rales or wheezes. Normal Respiratory effort, no retractions.  Heart: NSR.  No murmurs or rubs appreciated. No peripheral edema  Abdomen: Soft.  Non-tender.  No masses.  No HSM. No hernia  Extremities: Moves all appropriately.  Normal ROM for age. No lymphadenopathy.  Neuro: Oriented to PPT.  Normal mood. Normal affect.     Pelvic:   Vulva: Normal appearance.  No lesions.  Vagina: No lesions or abnormalities noted.  Support: Normal pelvic support.  Urethra No masses tenderness or scarring.  Meatus Normal size without lesions or prolapse.  Cervix: Normal ectropion.  No lesions.  Anus: Normal exam.  No lesions.  Perineum: Normal exam.  No lesions.        Bimanual   Uterus: 10 weeks non-tender.  Mobile.  AV.  Adnexae: No masses.  Non-tender to palpation.  Cul-de-sac: Negative for abnormality.    See ultrasound results revealing fibroid uterus and ovary enlarged with a complex cyst likely hemorrhagic cyst  Assessment:   [id number] Patient Active Problem List   Diagnosis Date Noted   BV (bacterial vaginosis) 05/28/2023   Menorrhagia with regular cycle 05/28/2023   Cough 05/27/2022   Right knee pain 10/28/2021   S/P tubal ligation 06/26/2021    Sterilization consult    Dysplasia of cervix, low grade (CIN 1) 02/27/2021   Cervical high risk human papillomavirus (HPV) DNA test positive 02/15/2020   Abnormal weight gain 01/09/2020   Attention deficit disorder (ADD) in adult 01/09/2020   Primary osteoarthritis of left knee 02/05/2018   Sprain of lateral collateral ligament of left knee 02/05/2018   Tear of medial meniscus of left knee, current 02/05/2018   IUD check up 01/12/2017   Headache 07/23/2016   Lower abdominal pain 07/13/2016    1. Pre-op exam   2. Menorrhagia with regular cycle   3. Intramural leiomyoma of uterus   4. Right ovarian cyst   5. H/O bilateral salpingectomy    She has previously discussed definitive surgery with Dr. Denman Fischer.  She presents today to finalize her plans for surgery.  Plan:   Orders: Meds ordered this encounter  Medications   metroNIDAZOLE  (FLAGYL ) 500 MG tablet    Sig: Take 1 tablet (500 mg total) by mouth 2 (two) times daily. Begin 5 days prior to scheduled surgery as directed.    Dispense:  10 tablet    Refill:  0     1.  LAVH RSO

## 2023-11-26 DIAGNOSIS — J301 Allergic rhinitis due to pollen: Secondary | ICD-10-CM | POA: Diagnosis not present

## 2023-12-02 ENCOUNTER — Ambulatory Visit (INDEPENDENT_AMBULATORY_CARE_PROVIDER_SITE_OTHER)

## 2023-12-02 DIAGNOSIS — E538 Deficiency of other specified B group vitamins: Secondary | ICD-10-CM | POA: Diagnosis not present

## 2023-12-02 MED ORDER — CYANOCOBALAMIN 1000 MCG/ML IJ SOLN
1000.0000 ug | Freq: Once | INTRAMUSCULAR | Status: AC
  Administered 2023-12-02: 1000 ug via INTRAMUSCULAR

## 2023-12-03 DIAGNOSIS — J301 Allergic rhinitis due to pollen: Secondary | ICD-10-CM | POA: Diagnosis not present

## 2023-12-03 DIAGNOSIS — Z0289 Encounter for other administrative examinations: Secondary | ICD-10-CM

## 2023-12-11 ENCOUNTER — Ambulatory Visit: Admitting: Physician Assistant

## 2023-12-13 ENCOUNTER — Other Ambulatory Visit: Payer: Self-pay | Admitting: Gastroenterology

## 2023-12-14 ENCOUNTER — Telehealth: Payer: Self-pay

## 2023-12-14 ENCOUNTER — Other Ambulatory Visit: Payer: Self-pay | Admitting: Physician Assistant

## 2023-12-14 DIAGNOSIS — F988 Other specified behavioral and emotional disorders with onset usually occurring in childhood and adolescence: Secondary | ICD-10-CM

## 2023-12-14 MED ORDER — AMPHETAMINE-DEXTROAMPHETAMINE 20 MG PO TABS: 20.0000 mg | ORAL_TABLET | Freq: Two times a day (BID) | ORAL | 0 refills | Status: DC

## 2023-12-14 NOTE — Telephone Encounter (Signed)
Lauren sent Adderall.

## 2023-12-16 ENCOUNTER — Encounter: Payer: Self-pay | Admitting: Gastroenterology

## 2023-12-17 DIAGNOSIS — J301 Allergic rhinitis due to pollen: Secondary | ICD-10-CM | POA: Diagnosis not present

## 2023-12-18 ENCOUNTER — Other Ambulatory Visit: Payer: Self-pay

## 2023-12-18 MED ORDER — LINACLOTIDE 145 MCG PO CAPS: 145.0000 ug | ORAL_CAPSULE | Freq: Every day | ORAL | 0 refills | Status: AC

## 2023-12-21 ENCOUNTER — Other Ambulatory Visit: Payer: Self-pay

## 2023-12-21 ENCOUNTER — Encounter
Admission: RE | Admit: 2023-12-21 | Discharge: 2023-12-21 | Disposition: A | Discharge status: Home / Self Care | Source: Ambulatory Visit | Attending: Obstetrics and Gynecology | Admitting: Obstetrics and Gynecology

## 2023-12-21 VITALS — Ht 63.0 in | Wt 175.0 lb

## 2023-12-21 DIAGNOSIS — N938 Other specified abnormal uterine and vaginal bleeding: Secondary | ICD-10-CM

## 2023-12-21 DIAGNOSIS — Z01812 Encounter for preprocedural laboratory examination: Secondary | ICD-10-CM

## 2023-12-21 NOTE — Patient Instructions (Addendum)
 Your procedure is scheduled on: Monday, June 16 Report to the Registration Desk on the 1st floor of the [facility] To find out your arrival time, please call [phone] between 1PM - 3PM on: Friday, June 13 If your arrival time is 6:00 am, do not arrive before that time as the [facility] entrance doors do not open until 6:00 am.  REMEMBER: Instructions that are not followed completely may result in serious medical risk, up to and including death; or upon the discretion of your surgeon and anesthesiologist your surgery may need to be rescheduled.  Do not eat or drink after midnight the night before surgery.  No gum chewing or hard candies.  One week prior to surgery: starting June 9 Stop Anti-inflammatories (NSAIDS) such as Advil , Aleve , Ibuprofen , Motrin , Naproxen , Naprosyn  and Aspirin based products such as Excedrin, Goody's Powder, BC Powder. Stop ANY OVER THE COUNTER supplements until after surgery.  You may however, continue to take Tylenol  if needed for pain up until the day of surgery.  Continue taking all of your other prescription medications up until the day of surgery.  ON THE DAY OF SURGERY ONLY TAKE THESE MEDICATIONS WITH SIPS OF WATER:  metroNIDAZOLE  (FLAGYL )  omeprazole  (PRILOSEC)   No Alcohol for 24 hours before or after surgery.  No Smoking including e-cigarettes for 24 hours before surgery.  No chewable tobacco products for at least 6 hours before surgery.  No nicotine patches on the day of surgery.  Do not use any "recreational" drugs for at least a week (preferably 2 weeks) before your surgery.  Please be advised that the combination of cocaine and anesthesia may have negative outcomes, up to and including death. If you test positive for cocaine, your surgery will be cancelled.  On the morning of surgery brush your teeth with toothpaste and water, you may rinse your mouth with mouthwash if you wish. Do not swallow any toothpaste or mouthwash.  Use CHG  Soap as directed on instruction sheet.  Do not wear jewelry, make-up, hairpins, clips or nail polish.  For welded (permanent) jewelry: bracelets, anklets, waist bands, etc.  Please have this removed prior to surgery.  If it is not removed, there is a chance that hospital personnel will need to cut it off on the day of surgery.  Do not wear lotions, powders, or perfumes.   Do not shave body hair from the neck down 48 hours before surgery.  Contact lenses, hearing aids and dentures may not be worn into surgery.  Do not bring valuables to the hospital. [facility] is not responsible for any missing/lost belongings or valuables.   Notify your doctor if there is any change in your medical condition (cold, fever, infection).  Wear comfortable clothing (specific to your surgery type) to the hospital.  After surgery, you can help prevent lung complications by doing breathing exercises.  Take deep breaths and cough every 1-2 hours. Your doctor may order a device called an Incentive Spirometer to help you take deep breaths. When coughing or sneezing, hold a pillow firmly against your incision with both hands. This is called "splinting." Doing this helps protect your incision. It also decreases belly discomfort.  If you are being discharged the day of surgery, you will not be allowed to drive home. You will need a responsible individual to drive you home and stay with you for 24 hours after surgery.   If you are taking public transportation, you will need to have a responsible individual with  you.  Please call the Pre-admissions Testing Dept. at [phone] if you have any questions about these instructions.  Surgery Visitation Policy:  Patients having surgery or a procedure may have two visitors.  Children under the age of 67 must have an adult with them who is not the patient.     Preparing for Surgery with CHLORHEXIDINE  GLUCONATE (CHG) Soap  Chlorhexidine  Gluconate (CHG) Soap  o  An antiseptic cleaner that kills germs and bonds with the skin to continue killing germs even after washing  o Used for showering the night before surgery and morning of surgery  Before surgery, you can play an important role by reducing the number of germs on your skin.  CHG (Chlorhexidine  gluconate) soap is an antiseptic cleanser which kills germs and bonds with the skin to continue killing germs even after washing.  Please do not use if you have an allergy to CHG or antibacterial soaps. If your skin becomes reddened/irritated stop using the CHG.  1. Shower the NIGHT BEFORE SURGERY and the MORNING OF SURGERY with CHG soap.  2. If you choose to wash your hair, wash your hair first as usual with your normal shampoo.  3. After shampooing, rinse your hair and body thoroughly to remove the shampoo.  4. Use CHG as you would any other liquid soap. You can apply CHG directly to the skin and wash gently with a scrungie or a clean washcloth.  5. Apply the CHG soap to your body only from the neck down. Do not use on open wounds or open sores. Avoid contact with your eyes, ears, mouth, and genitals (private parts). Wash face and genitals (private parts) with your normal soap.  6. Wash thoroughly, paying special attention to the area where your surgery will be performed.  7. Thoroughly rinse your body with warm water.  8. Do not shower/wash with your normal soap after using and rinsing off the CHG soap.  9. Pat yourself dry with a clean towel.  10. Wear clean pajamas to bed the night before surgery.  12. Place clean sheets on your bed the night of your first shower and do not sleep with pets.  13. Shower again with the CHG soap on the day of surgery prior to arriving at the hospital.  14. Do not apply any deodorants/lotions/powders.  15. Please wear clean clothes to the hospital.

## 2023-12-22 ENCOUNTER — Encounter
Admission: RE | Admit: 2023-12-22 | Discharge: 2023-12-22 | Disposition: A | Discharge status: Home / Self Care | Source: Ambulatory Visit | Attending: Obstetrics and Gynecology | Admitting: Obstetrics and Gynecology

## 2023-12-22 DIAGNOSIS — N938 Other specified abnormal uterine and vaginal bleeding: Secondary | ICD-10-CM | POA: Diagnosis not present

## 2023-12-22 DIAGNOSIS — Z01812 Encounter for preprocedural laboratory examination: Secondary | ICD-10-CM | POA: Insufficient documentation

## 2023-12-22 DIAGNOSIS — Z01818 Encounter for other preprocedural examination: Secondary | ICD-10-CM

## 2023-12-22 LAB — TYPE AND SCREEN
ABO/RH(D): AB NEG
Antibody Screen: NEGATIVE

## 2023-12-22 LAB — CBC
HCT: 36.6 % (ref 36.0–46.0)
Hemoglobin: 12.2 g/dL (ref 12.0–15.0)
MCH: 29 pg (ref 26.0–34.0)
MCHC: 33.3 g/dL (ref 30.0–36.0)
MCV: 86.9 fL (ref 80.0–100.0)
Platelets: 199 10*3/uL (ref 150–400)
RBC: 4.21 MIL/uL (ref 3.87–5.11)
RDW: 13.2 % (ref 11.5–15.5)
WBC: 5.5 10*3/uL (ref 4.0–10.5)
nRBC: 0 % (ref 0.0–0.2)

## 2023-12-22 LAB — BASIC METABOLIC PANEL WITH GFR
Anion gap: 8 (ref 5–15)
BUN: 17 mg/dL (ref 6–20)
CO2: 23 mmol/L (ref 22–32)
Calcium: 8.6 mg/dL — ABNORMAL LOW (ref 8.9–10.3)
Chloride: 109 mmol/L (ref 98–111)
Creatinine, Ser: 0.76 mg/dL (ref 0.44–1.00)
GFR, Estimated: >60 mL/min (ref >60)
Glucose, Bld: 113 mg/dL — ABNORMAL HIGH (ref 70–99)
Potassium: 3.8 mmol/L (ref 3.5–5.1)
Sodium: 140 mmol/L (ref 135–145)

## 2023-12-24 DIAGNOSIS — J301 Allergic rhinitis due to pollen: Secondary | ICD-10-CM | POA: Diagnosis not present

## 2023-12-28 ENCOUNTER — Encounter: Payer: Self-pay | Admitting: Obstetrics and Gynecology

## 2023-12-28 ENCOUNTER — Ambulatory Visit: Payer: Self-pay | Admitting: Urgent Care

## 2023-12-28 ENCOUNTER — Ambulatory Visit: Payer: Self-pay | Admitting: Anesthesiology

## 2023-12-28 ENCOUNTER — Other Ambulatory Visit: Payer: Self-pay

## 2023-12-28 ENCOUNTER — Encounter
Admission: RE | Disposition: A | Discharge status: Home / Self Care | Payer: Self-pay | Source: Home / Self Care | Attending: Obstetrics and Gynecology

## 2023-12-28 ENCOUNTER — Ambulatory Visit
Admission: RE | Admit: 2023-12-28 | Discharge: 2023-12-28 | Disposition: A | Discharge status: Home / Self Care | Attending: Obstetrics and Gynecology | Admitting: Obstetrics and Gynecology

## 2023-12-28 DIAGNOSIS — N8003 Adenomyosis of the uterus: Secondary | ICD-10-CM

## 2023-12-28 DIAGNOSIS — N921 Excessive and frequent menstruation with irregular cycle: Secondary | ICD-10-CM | POA: Diagnosis not present

## 2023-12-28 DIAGNOSIS — Z01812 Encounter for preprocedural laboratory examination: Secondary | ICD-10-CM

## 2023-12-28 DIAGNOSIS — N879 Dysplasia of cervix uteri, unspecified: Secondary | ICD-10-CM | POA: Diagnosis not present

## 2023-12-28 DIAGNOSIS — N920 Excessive and frequent menstruation with regular cycle: Secondary | ICD-10-CM | POA: Insufficient documentation

## 2023-12-28 DIAGNOSIS — N888 Other specified noninflammatory disorders of cervix uteri: Secondary | ICD-10-CM | POA: Diagnosis not present

## 2023-12-28 DIAGNOSIS — N870 Mild cervical dysplasia: Secondary | ICD-10-CM | POA: Diagnosis not present

## 2023-12-28 DIAGNOSIS — Z9079 Acquired absence of other genital organ(s): Secondary | ICD-10-CM | POA: Insufficient documentation

## 2023-12-28 DIAGNOSIS — D251 Intramural leiomyoma of uterus: Secondary | ICD-10-CM | POA: Diagnosis not present

## 2023-12-28 DIAGNOSIS — N8301 Follicular cyst of right ovary: Secondary | ICD-10-CM | POA: Diagnosis not present

## 2023-12-28 DIAGNOSIS — D259 Leiomyoma of uterus, unspecified: Secondary | ICD-10-CM | POA: Diagnosis not present

## 2023-12-28 DIAGNOSIS — N83201 Unspecified ovarian cyst, right side: Secondary | ICD-10-CM

## 2023-12-28 HISTORY — PX: LAPAROSCOPIC VAGINAL HYSTERECTOMY WITH SALPINGO OOPHORECTOMY: SHX6681

## 2023-12-28 LAB — POCT PREGNANCY, URINE: Preg Test, Ur: NEGATIVE

## 2023-12-28 SURGERY — HYSTERECTOMY, VAGINAL, LAPAROSCOPY-ASSISTED, WITH SALPINGO-OOPHORECTOMY
Anesthesia: General

## 2023-12-28 MED ORDER — PROPOFOL 1000 MG/100ML IV EMUL
INTRAVENOUS | Status: AC
  Filled 2023-12-28: qty 100

## 2023-12-28 MED ORDER — HYDROMORPHONE HCL 1 MG/ML IJ SOLN
INTRAMUSCULAR | Status: DC | PRN
  Administered 2023-12-28 (×2): .5 mg via INTRAVENOUS

## 2023-12-28 MED ORDER — ONDANSETRON HCL 4 MG PO TABS: 4.0000 mg | ORAL_TABLET | Freq: Three times a day (TID) | ORAL | 0 refills | Status: DC | PRN

## 2023-12-28 MED ORDER — KETAMINE HCL 50 MG/5ML IJ SOSY
PREFILLED_SYRINGE | INTRAMUSCULAR | Status: AC
Start: 2023-12-28 — End: 2023-12-28
  Filled 2023-12-28: qty 5

## 2023-12-28 MED ORDER — VASOPRESSIN 20 UNIT/ML IV SOLN
INTRAVENOUS | Status: DC | PRN
  Administered 2023-12-28: 10 mL via INTRAMUSCULAR

## 2023-12-28 MED ORDER — ROCURONIUM BROMIDE 100 MG/10ML IV SOLN
INTRAVENOUS | Status: DC | PRN
  Administered 2023-12-28: 20 mg via INTRAVENOUS
  Administered 2023-12-28: 50 mg via INTRAVENOUS

## 2023-12-28 MED ORDER — BUPIVACAINE HCL 0.5 % IJ SOLN
INTRAMUSCULAR | Status: DC | PRN
Start: 2023-12-28 — End: 2023-12-28
  Administered 2023-12-28: 15 mL

## 2023-12-28 MED ORDER — LACTATED RINGERS IV SOLN: INTRAVENOUS | Status: DC

## 2023-12-28 MED ORDER — HYDROCODONE-ACETAMINOPHEN 5-325 MG PO TABS: 1.0000 | ORAL_TABLET | Freq: Four times a day (QID) | ORAL | 0 refills | Status: DC | PRN

## 2023-12-28 MED ORDER — 0.9 % SODIUM CHLORIDE (POUR BTL) OPTIME
TOPICAL | Status: DC | PRN
  Administered 2023-12-28: 1000 mL

## 2023-12-28 MED ORDER — CHLORHEXIDINE GLUCONATE 0.12 % MT SOLN
15.0000 mL | Freq: Once | OROMUCOSAL | Status: AC
  Administered 2023-12-28: 15 mL via OROMUCOSAL

## 2023-12-28 MED ORDER — FENTANYL CITRATE (PF) 100 MCG/2ML IJ SOLN: INTRAMUSCULAR | Status: DC | PRN

## 2023-12-28 MED ORDER — ONDANSETRON HCL 4 MG/2ML IJ SOLN
INTRAMUSCULAR | Status: AC
  Filled 2023-12-28: qty 2

## 2023-12-28 MED ORDER — FENTANYL CITRATE (PF) 100 MCG/2ML IJ SOLN
INTRAMUSCULAR | Status: AC
  Filled 2023-12-28: qty 2

## 2023-12-28 MED ORDER — OXYCODONE HCL 5 MG PO TABS
5.0000 mg | ORAL_TABLET | Freq: Once | ORAL | Status: AC | PRN
  Administered 2023-12-28: 5 mg via ORAL

## 2023-12-28 MED ORDER — PROPOFOL 500 MG/50ML IV EMUL
INTRAVENOUS | Status: DC | PRN
  Administered 2023-12-28: 150 ug/kg/min via INTRAVENOUS

## 2023-12-28 MED ORDER — OXYCODONE HCL 5 MG/5ML PO SOLN: 5.0000 mg | Freq: Once | ORAL | Status: AC | PRN

## 2023-12-28 MED ORDER — SUGAMMADEX SODIUM 200 MG/2ML IV SOLN
INTRAVENOUS | Status: DC | PRN
Start: 2023-12-28 — End: 2023-12-28
  Administered 2023-12-28: 158.8 mg via INTRAVENOUS

## 2023-12-28 MED ORDER — POVIDONE-IODINE 10 % EX SWAB
2.0000 | Freq: Once | CUTANEOUS | Status: AC
  Administered 2023-12-28: 2 via TOPICAL

## 2023-12-28 MED ORDER — CHLORHEXIDINE GLUCONATE 0.12 % MT SOLN
OROMUCOSAL | Status: AC
  Filled 2023-12-28: qty 15

## 2023-12-28 MED ORDER — ORAL CARE MOUTH RINSE: 15.0000 mL | Freq: Once | OROMUCOSAL | Status: AC

## 2023-12-28 MED ORDER — ROCURONIUM BROMIDE 10 MG/ML (PF) SYRINGE
PREFILLED_SYRINGE | INTRAVENOUS | Status: AC
  Filled 2023-12-28: qty 10

## 2023-12-28 MED ORDER — DEXAMETHASONE SODIUM PHOSPHATE 10 MG/ML IJ SOLN
INTRAMUSCULAR | Status: AC
  Filled 2023-12-28: qty 2

## 2023-12-28 MED ORDER — GABAPENTIN 300 MG PO CAPS
ORAL_CAPSULE | ORAL | Status: AC
  Filled 2023-12-28: qty 1

## 2023-12-28 MED ORDER — OXYCODONE HCL 5 MG PO TABS
ORAL_TABLET | ORAL | Status: AC
  Filled 2023-12-28: qty 1

## 2023-12-28 MED ORDER — PROPOFOL 10 MG/ML IV BOLUS
INTRAVENOUS | Status: AC
  Filled 2023-12-28: qty 20

## 2023-12-28 MED ORDER — MIDAZOLAM HCL 2 MG/2ML IJ SOLN
INTRAMUSCULAR | Status: AC
  Filled 2023-12-28: qty 2

## 2023-12-28 MED ORDER — CELECOXIB 200 MG PO CAPS
ORAL_CAPSULE | ORAL | Status: AC
  Filled 2023-12-28: qty 2

## 2023-12-28 MED ORDER — LIDOCAINE HCL (PF) 2 % IJ SOLN
INTRAMUSCULAR | Status: AC
  Filled 2023-12-28: qty 5

## 2023-12-28 MED ORDER — DEXAMETHASONE SODIUM PHOSPHATE 10 MG/ML IJ SOLN
INTRAMUSCULAR | Status: DC | PRN
  Administered 2023-12-28: 10 mg via INTRAVENOUS

## 2023-12-28 MED ORDER — FENTANYL CITRATE (PF) 100 MCG/2ML IJ SOLN: 25.0000 ug | INTRAMUSCULAR | Status: DC | PRN

## 2023-12-28 MED ORDER — CEFAZOLIN SODIUM-DEXTROSE 2-4 GM/100ML-% IV SOLN
2.0000 g | INTRAVENOUS | Status: AC
  Administered 2023-12-28: 2 g via INTRAVENOUS

## 2023-12-28 MED ORDER — SODIUM CHLORIDE (PF) 0.9 % IJ SOLN
INTRAMUSCULAR | Status: AC
  Filled 2023-12-28: qty 50

## 2023-12-28 MED ORDER — VASOPRESSIN 20 UNIT/ML IV SOLN
INTRAVENOUS | Status: AC
  Filled 2023-12-28: qty 1

## 2023-12-28 MED ORDER — ACETAMINOPHEN 500 MG PO TABS
1000.0000 mg | ORAL_TABLET | ORAL | Status: AC
  Administered 2023-12-28: 1000 mg via ORAL

## 2023-12-28 MED ORDER — CELECOXIB 200 MG PO CAPS
400.0000 mg | ORAL_CAPSULE | ORAL | Status: AC
  Administered 2023-12-28: 400 mg via ORAL

## 2023-12-28 MED ORDER — LIDOCAINE HCL (CARDIAC) PF 100 MG/5ML IV SOSY
PREFILLED_SYRINGE | INTRAVENOUS | Status: DC | PRN
  Administered 2023-12-28: 80 mg via INTRAVENOUS

## 2023-12-28 MED ORDER — MIDAZOLAM HCL 2 MG/2ML IJ SOLN
INTRAMUSCULAR | Status: DC | PRN
  Administered 2023-12-28: 2 mg via INTRAVENOUS

## 2023-12-28 MED ORDER — ONDANSETRON HCL 4 MG/2ML IJ SOLN
INTRAMUSCULAR | Status: DC | PRN
Start: 2023-12-28 — End: 2023-12-28
  Administered 2023-12-28: 4 mg via INTRAVENOUS

## 2023-12-28 MED ORDER — KETAMINE HCL 50 MG/5ML IJ SOSY
PREFILLED_SYRINGE | INTRAMUSCULAR | Status: DC | PRN
Start: 2023-12-28 — End: 2023-12-28
  Administered 2023-12-28 (×2): 20 mg via INTRAVENOUS
  Administered 2023-12-28: 10 mg via INTRAVENOUS

## 2023-12-28 MED ORDER — BUPIVACAINE HCL (PF) 0.5 % IJ SOLN
INTRAMUSCULAR | Status: AC
  Filled 2023-12-28: qty 30

## 2023-12-28 MED ORDER — CEFAZOLIN SODIUM-DEXTROSE 2-4 GM/100ML-% IV SOLN
INTRAVENOUS | Status: AC
Start: 2023-12-28 — End: 2023-12-28
  Filled 2023-12-28: qty 100

## 2023-12-28 MED ORDER — PROPOFOL 10 MG/ML IV BOLUS
INTRAVENOUS | Status: DC | PRN
  Administered 2023-12-28: 150 mg via INTRAVENOUS

## 2023-12-28 MED ORDER — HYDROMORPHONE HCL 1 MG/ML IJ SOLN
INTRAMUSCULAR | Status: AC
  Filled 2023-12-28: qty 1

## 2023-12-28 MED ORDER — ACETAMINOPHEN 500 MG PO TABS
ORAL_TABLET | ORAL | Status: AC
  Filled 2023-12-28: qty 2

## 2023-12-28 MED ORDER — GABAPENTIN 300 MG PO CAPS
300.0000 mg | ORAL_CAPSULE | ORAL | Status: AC
  Administered 2023-12-28: 300 mg via ORAL

## 2023-12-28 SURGICAL SUPPLY — 41 items
BAG URINE DRAIN 2000ML AR STRL (UROLOGICAL SUPPLIES) ×1 IMPLANT
BLADE SURG SZ11 CARB STEEL (BLADE) ×1 IMPLANT
CATH URTH 16FR FL 2W BLN LF (CATHETERS) ×1 IMPLANT
CHLORAPREP W/TINT 26 (MISCELLANEOUS) ×1 IMPLANT
DERMABOND ADVANCED .7 DNX12 (GAUZE/BANDAGES/DRESSINGS) ×1 IMPLANT
DERMABOND ADVANCED .7 DNX6 (GAUZE/BANDAGES/DRESSINGS) IMPLANT
ELECTRODE REM PT RTRN 9FT ADLT (ELECTROSURGICAL) ×1 IMPLANT
GAUZE 4X4 16PLY ~~LOC~~+RFID DBL (SPONGE) ×1 IMPLANT
GLOVE BIO SURGEON STRL SZ 6 (GLOVE) ×1 IMPLANT
GLOVE BIOGEL PI IND STRL 6 (GLOVE) ×1 IMPLANT
GLOVE PI ORTHO PRO STRL 7.5 (GLOVE) ×2 IMPLANT
GOWN STRL REUS W/ TWL LRG LVL3 (GOWN DISPOSABLE) ×2 IMPLANT
GOWN STRL REUS W/ TWL XL LVL3 (GOWN DISPOSABLE) ×2 IMPLANT
HANDLE YANKAUER SUCT BULB TIP (MISCELLANEOUS) IMPLANT
IRRIGATION STRYKERFLOW (MISCELLANEOUS) IMPLANT
IV LACTATED RINGERS 1000ML (IV SOLUTION) ×1 IMPLANT
KIT PINK PAD W/HEAD ARM REST (MISCELLANEOUS) ×1 IMPLANT
KIT TURNOVER CYSTO (KITS) ×1 IMPLANT
LIGASURE LAP MARYLAND 5MM 37CM (ELECTROSURGICAL) IMPLANT
MANIFOLD NEPTUNE II (INSTRUMENTS) ×1 IMPLANT
NDL SPNL 22GX3.5 QUINCKE BK (NEEDLE) ×1 IMPLANT
NEEDLE SPNL 22GX3.5 QUINCKE BK (NEEDLE) ×1 IMPLANT
PACK BASIN MINOR ARMC (MISCELLANEOUS) ×1 IMPLANT
PACK GYN LAPAROSCOPIC (MISCELLANEOUS) ×1 IMPLANT
PAD PREP OB/GYN DISP 24X41 (PERSONAL CARE ITEMS) ×1 IMPLANT
PENCIL SMOKE EVACUATOR (MISCELLANEOUS) IMPLANT
RETRACTOR PHONTONGUIDE ADAPT (ADAPTER) IMPLANT
SCRUB CHG 4% DYNA-HEX 4OZ (MISCELLANEOUS) ×1 IMPLANT
SLEEVE Z-THREAD 5X100MM (TROCAR) ×2 IMPLANT
SOLUTION ELECTROSURG ANTI STCK (MISCELLANEOUS) IMPLANT
SPONGE T-LAP 18X18 ~~LOC~~+RFID (SPONGE) IMPLANT
SUT VIC AB 0 CT1 27XCR 8 STRN (SUTURE) ×2 IMPLANT
SUT VIC AB 0 CT1 36 (SUTURE) ×1 IMPLANT
SUT VIC AB 4-0 FS2 27 (SUTURE) ×1 IMPLANT
SYR 10ML LL (SYRINGE) ×1 IMPLANT
SYR CONTROL 10ML LL (SYRINGE) ×1 IMPLANT
TAPE TRANSPORE STRL 2 31045 (GAUZE/BANDAGES/DRESSINGS) ×1 IMPLANT
TRAP FLUID SMOKE EVACUATOR (MISCELLANEOUS) ×1 IMPLANT
TROCAR Z-THREAD FIOS 5X100MM (TROCAR) ×1 IMPLANT
TUBING EVAC SMOKE HEATED PNEUM (TUBING) ×1 IMPLANT
WATER STERILE IRR 500ML POUR (IV SOLUTION) ×1 IMPLANT

## 2023-12-28 NOTE — Transfer of Care (Signed)
 Immediate Anesthesia Transfer of Care Note  Patient: Paula Sullivan  Procedure(s) Performed: HYSTERECTOMY, VAGINAL, LAPAROSCOPY-ASSISTED, WITH SALPINGO-OOPHORECTOMY  Patient Location: PACU  Anesthesia Type:General  Level of Consciousness: drowsy and patient cooperative  Airway & Oxygen Therapy: Patient Spontanous Breathing and Patient connected to face mask oxygen  Post-op Assessment: Report given to RN and Post -op Vital signs reviewed and stable  Post vital signs: stable  Last Vitals:  Vitals Value Taken Time  BP 105/59 12/28/23 11:45  Temp    Pulse 52 12/28/23 11:47  Resp 19 12/28/23 11:47  SpO2 100 % 12/28/23 11:47  Vitals shown include unfiled device data.  Last Pain:  Vitals:   12/28/23 0822  TempSrc: Temporal  PainSc: 8          Complications: No notable events documented.

## 2023-12-28 NOTE — Op Note (Signed)
 OPERATIVE NOTE 12/28/2023 11:35 AM  PRE-OPERATIVE DIAGNOSIS:  1) Dysfunctional uterine bleeding, fibroids, right ovarian cyst, pelvic pain  POST-OPERATIVE DIAGNOSIS:  Post-Op Diagnosis Codes:    * Menorrhagia with regular cycle [N92.0]    * Intramural leiomyoma of uterus [D25.1]    * Right ovarian cyst [N83.201]    * H/O bilateral salpingectomy [Z90.79]  OPERATION: Procedure(s) (LRB): HYSTERECTOMY, VAGINAL, LAPAROSCOPY-ASSISTED, WITH SALPINGO-OOPHORECTOMY (N/A) (RIGHT)    SURGEON(S): Surgeons and Role:    Zenobia Hila, MD - Primary   ANESTHESIA: Choice  ESTIMATED BLOOD LOSS: 30 mL  OPERATIVE FINDINGS: Appearance of normal pelvis with small CL cyst on L ovary  SPECIMEN:  ID Type Source Tests Collected by Time Destination  1 : uterus with cervix, right ovary Tissue Path Tissue SURGICAL PATHOLOGY Zenobia Hila, MD 12/28/2023 1059     COMPLICATIONS: None  DRAINS: None  DISPOSITION: Stable to recovery room  DESCRIPTION OF PROCEDURE:      The patient was prepped and draped in the dorsolithotomy position and placed under general anesthesia. The bladder was emptied. The cervix was grasped with a multi-toothed tenaculum and a uterine manipulator was placed within the cervical os respecting the position and curvature of the uterus. After changing gloves we proceeded abdominally. A small infraumbilical incision was made and a 5 mm trocar port was placed within the abdominopelvic cavity. The opening pressure was less than 7 mmHg.  Approximately 3 and 1/2 L of carbon dioxide gas was instilled within the abdominal pelvic cavity. The laparoscope was placed and the pelvis and abdomen were carefully inspected. In the usual manner, under direct visualization right and left lower quadrant ports of 5 mm size were placed. Both ureters were identified in the pelvis prior to dissection or clamping and cutting of pedicles. The right infundibulopelvic ligament was carefully  identified. The ureters were again identified out of the operative field. The ligament was triply coagulated and divided. Hemostasis of the pedicle was noted. The round ligaments were coagulated and divided and a bladder flap was created. The upper aspect of the broad ligament was clamped coagulated and divided. The uterine arteries were skeletonized, triply coagulated and divided. Careful inspection of all pedicles and the remainder of the pelvis was performed. Hemostasis was noted. The lower quadrant ports were removed, hemostasis of the port sites was noted, and the incisions were closed in subcuticular manner. The laparoscope and trocar sleeve were removed from the infraumbilical incision, hemostasis was noted, and the incision was closed in a subcuticular manner. A long-acting anesthetic was employed in the skin incisions. We then proceeded vaginally. A weighted speculum was placed posteriorly. A multi-toothed tenaculum was used to grasp the cervix and the cervix was injected in a circumferential manner with a dilute Pitressin solution. An incision was made around the cervix and the vaginal mucosa was dissected off of the cervix. The posterior cul-de-sac was identified and entered and the weighted speculum was placed within this. The anterior cul-de-sac was identified and entered and a retractor was placed and used to retract the bladder anteriorly keeping it out of the operative field. The uterosacral ligaments were clamped divided and suture ligated. The cardinal ligaments were clamped divided and suture ligated. The small remaining pedicle was clamped divided and suture ligated bilaterally allowing delivery of the specimen. Angle sutures were placed in the usual manner. A culdoplasty was performed. The peritoneum was identified anteriorly and then incorporating the left upper pedicle left lower pedicle right lower pedicle  right upper pedicle and anterior peritoneum a pursestring suture was placed  exteriorizing all pedicles. Hemostasis of all pedicles was noted at this time. The vaginal mucosa was then closed with a running suture of Vicryl. The patient went to recovery room in stable condition. Clear urine was noted in the Foley at the conclusion of the procedure.  Delice Felt, M.D. 12/28/2023 11:35 AM

## 2023-12-28 NOTE — Anesthesia Preprocedure Evaluation (Signed)
 Anesthesia Evaluation  Patient identified by MRN, date of birth, ID band Patient awake    Reviewed: Allergy & Precautions, NPO status , Patient's Chart, lab work & pertinent test results  History of Anesthesia Complications (+) PONV and history of anesthetic complications  Airway Mallampati: III  TM Distance: >3 FB Neck ROM: full    Dental  (+) Chipped   Pulmonary neg pulmonary ROS, neg shortness of breath   Pulmonary exam normal        Cardiovascular Exercise Tolerance: Good (-) angina (-) Past MI negative cardio ROS Normal cardiovascular exam     Neuro/Psych  Headaches PSYCHIATRIC DISORDERS         GI/Hepatic negative GI ROS, Neg liver ROS,neg GERD  ,,  Endo/Other  negative endocrine ROS    Renal/GU      Musculoskeletal   Abdominal   Peds  Hematology negative hematology ROS (+)   Anesthesia Other Findings Past Medical History: No date: ADHD No date: Anemia 11/2023: Dysfunctional uterine bleeding No date: PONV (postoperative nausea and vomiting) 11/2023: Right ovarian cyst No date: Type AB blood, Rh negative  Past Surgical History: 01/2005: CHOLECYSTECTOMY 06/13/2021: LAPAROSCOPIC BILATERAL SALPINGECTOMY; Bilateral     Comment:  Procedure: LAPAROSCOPIC BILATERAL SALPINGECTOMY;                Surgeon: Alben Alma, MD;  Location: [facility] ORS;                Service: Gynecology;  Laterality: Bilateral; 10/2020: SLEEVE GASTROPLASTY  BMI    Body Mass Index: 31.00 kg/m      Reproductive/Obstetrics negative OB ROS                             Anesthesia Physical Anesthesia Plan  ASA: 2  Anesthesia Plan: General ETT   Post-op Pain Management:    Induction: Intravenous  PONV Risk Score and Plan: Ondansetron , Dexamethasone , Midazolam , Treatment may vary due to age or medical condition, TIVA and Propofol  infusion  Airway Management Planned: Oral ETT  Additional Equipment:    Intra-op Plan:   Post-operative Plan: Extubation in OR  Informed Consent: I have reviewed the patients History and Physical, chart, labs and discussed the procedure including the risks, benefits and alternatives for the proposed anesthesia with the patient or authorized representative who has indicated his/her understanding and acceptance.     Dental Advisory Given  Plan Discussed with: Anesthesiologist, CRNA and Surgeon  Anesthesia Plan Comments: (Patient consented for risks of anesthesia including but not limited to:  - adverse reactions to medications - damage to eyes, teeth, lips or other oral mucosa - nerve damage due to positioning  - sore throat or hoarseness - Damage to heart, brain, nerves, lungs, other parts of body or loss of life  Patient voiced understanding and assent.)       Anesthesia Quick Evaluation

## 2023-12-28 NOTE — Anesthesia Postprocedure Evaluation (Signed)
 Anesthesia Post Note  Patient: SAESHA LLERENAS  Procedure(s) Performed: HYSTERECTOMY, VAGINAL, LAPAROSCOPY-ASSISTED, WITH SALPINGO-OOPHORECTOMY  Patient location during evaluation: PACU Anesthesia Type: General Level of consciousness: awake and alert Pain management: pain level controlled Vital Signs Assessment: post-procedure vital signs reviewed and stable Respiratory status: spontaneous breathing, nonlabored ventilation, respiratory function stable and patient connected to nasal cannula oxygen Cardiovascular status: blood pressure returned to baseline and stable Postop Assessment: no apparent nausea or vomiting Anesthetic complications: no   No notable events documented.   Last Vitals:  Vitals:   12/28/23 1315 12/28/23 1342  BP: 105/64 (!) 113/55  Pulse: 70 68  Resp: 13 15  Temp:  (!) 36.3 C  SpO2: 100% 100%    Last Pain:  Vitals:   12/28/23 1342  TempSrc: Temporal  PainSc: 8                  Portia Brittle Sadi Arave

## 2023-12-28 NOTE — H&P (Signed)
 PRE-OPERATIVE HISTORY AND PHYSICAL EXAM   PCP:  Jacques Mattock, PA-C Subjective:    HPI:   She has the following symptoms: Pelvic pain, dysfunctional bleeding recalcitrant to hormone therapy, uterine fibroids, right ovarian cyst.   Patient initially seen in consultation with Dr. Denman Fischer and they have discussed risks and benefits of surgical options and patient has chosen hysterectomy.    Review of Systems:    Constitutional: Denied constitutional symptoms, night sweats, recent illness, fatigue, fever, insomnia and weight loss.  Eyes: Denied eye symptoms, eye pain, photophobia, vision change and visual disturbance.  Ears/Nose/Throat/Neck: Denied ear, nose, throat or neck symptoms, hearing loss, nasal discharge, sinus congestion and sore throat.  Cardiovascular: Denied cardiovascular symptoms, arrhythmia, chest pain/pressure, edema, exercise intolerance, orthopnea and palpitations.  Respiratory: Denied pulmonary symptoms, asthma, pleuritic pain, productive sputum, cough, dyspnea and wheezing.  Gastrointestinal: Denied, gastro-esophageal reflux, melena, nausea and vomiting.  Genitourinary: See HPI for additional information.  Musculoskeletal: Denied musculoskeletal symptoms, stiffness, swelling, muscle weakness and myalgia.  Dermatologic: Denied dermatology symptoms, rash and scar.  Neurologic: Denied neurology symptoms, dizziness, headache, neck pain and syncope.  Psychiatric: Denied psychiatric symptoms, anxiety and depression.  Endocrine: Denied endocrine symptoms including hot flashes and night sweats.                     OB History  Gravida Para Term Preterm AB Living   4 2 2   2 2    SAB IAB Ectopic Multiple Live Births      1   1 0 2         # Outcome Date GA Lbr Len/2nd Weight Sex Type Anes PTL Lv  4 Term 08/15/16 [redacted]w[redacted]d / 02:31 9 lb 1.3 oz (4.12 kg) M Vag-Spont EPI   LIV  3 Term 2004       F       LIV  2 Ectopic                     1 SAB                            Past Medical History:  Diagnosis Date   ADHD     Anemia     PONV (postoperative nausea and vomiting)     Type AB blood, Rh negative             Past Surgical History:  Procedure Laterality Date   CHOLECYSTECTOMY       GALLBLADDER SURGERY   01/2005   LAPAROSCOPIC BILATERAL SALPINGECTOMY Bilateral 06/13/2021    Procedure: LAPAROSCOPIC BILATERAL SALPINGECTOMY;  Surgeon: Alben Alma, MD;  Location: [facility] ORS;  Service: Gynecology;  Laterality: Bilateral;   SLEEVE GASTROPLASTY           SOCIAL HISTORY:   Social History       Tobacco Use  Smoking Status Never  Smokeless Tobacco Never    Social History        Substance and Sexual Activity  Alcohol Use Yes    Comment: Rarely     Social History       Substance and Sexual Activity  Drug Use  No           Family History  Problem Relation Age of Onset   Suicidality Father          2011   Diabetes Maternal Grandfather     Cancer Mother 57        hyst--? cervical      ALLERGIES:  Latex   MEDS:                    Current Outpatient Medications on File Prior to Visit  Medication Sig Dispense Refill   amphetamine -dextroamphetamine  (ADDERALL) 20 MG tablet Take 1 tablet (20 mg total) by mouth 2 (two) times daily. 60 tablet 0   EPIPEN  2-PAK 0.3 MG/0.3ML SOAJ injection as directed Injection as directed for 30 days       levocetirizine (XYZAL ) 5 MG tablet Take 1 tablet (5 mg total) by mouth every evening. 90 tablet 3   linaclotide  (LINZESS ) 145 MCG CAPS capsule TAKE 1 CAPSULE BY MOUTH DAILY BEFORE BREAKFAST. 90 capsule 1   Olopatadine HCl 0.6 % SOLN SPRAY 2 SPRAYS INTO EACH NOSTRIL TWICE A DAY       omeprazole  (PRILOSEC) 40 MG capsule Take 1 capsule (40 mg total) by mouth daily. 90 capsule 1   [DISCONTINUED] norgestimate -ethinyl estradiol  (ORTHO-CYCLEN) 0.25-35 MG-MCG tablet Take 1 tablet by mouth daily. 1 Package 11    No current facility-administered medications on  file prior to visit.          Meds ordered this encounter  Medications   metroNIDAZOLE  (FLAGYL ) 500 MG tablet      Sig: Take 1 tablet (500 mg total) by mouth 2 (two) times daily. Begin 5 days prior to scheduled surgery as directed.      Dispense:  10 tablet      Refill:  0      Physical examination BP 129/80   Pulse 97   Ht 5' 3 (1.6 m)   Wt 182 lb 9.6 oz (82.8 kg)   LMP  (LMP Unknown)   BMI 32.35 kg/m    General NAD, Conversant  HEENT Atraumatic; Op clear with mmm.  Normo-cephalic.  Anicteric sclerae  Thyroid/Neck Smooth without nodularity or enlargement. Normal ROM.  Neck Supple.  Skin No rashes, lesions or ulceration. Normal palpated skin turgor. No nodularity.  Breasts: No masses or discharge.  Symmetric.  No axillary adenopathy.  Lungs: Clear to auscultation.No rales or wheezes. Normal Respiratory effort, no retractions.  Heart: NSR.  No murmurs or rubs appreciated. No peripheral edema  Abdomen: Soft.  Non-tender.  No masses.  No HSM. No hernia  Extremities: Moves all appropriately.  Normal ROM for age. No lymphadenopathy.  Neuro: Oriented to PPT.  Normal mood. Normal affect.              Pelvic:    Vulva: Normal appearance.  No lesions.   Vagina: No lesions or abnormalities noted.   Support: Normal pelvic support.   Urethra No masses tenderness or scarring.   Meatus Normal size without lesions or prolapse.   Cervix: Normal ectropion.  No lesions.   Anus: Normal exam.  No lesions.   Perineum: Normal exam.  No lesions.         Bimanual    Uterus: 10 weeks non-tender.  Mobile.  AV.    Adnexae: No masses.  Non-tender to palpation.    Cul-de-sac: Negative for abnormality.        See ultrasound results revealing fibroid uterus and ovary enlarged  with a complex cyst likely hemorrhagic cyst   Assessment:    [id number]     Patient Active Problem List    Diagnosis Date Noted   BV (bacterial vaginosis) 05/28/2023   Menorrhagia with regular cycle 05/28/2023   Cough  05/27/2022   Right knee pain 10/28/2021   S/P tubal ligation 06/26/2021   Sterilization consult     Dysplasia of cervix, low grade (CIN 1) 02/27/2021   Cervical high risk human papillomavirus (HPV) DNA test positive 02/15/2020   Abnormal weight gain 01/09/2020   Attention deficit disorder (ADD) in adult 01/09/2020   Primary osteoarthritis of left knee 02/05/2018   Sprain of lateral collateral ligament of left knee 02/05/2018   Tear of medial meniscus of left knee, current 02/05/2018   IUD check up 01/12/2017   Headache 07/23/2016   Lower abdominal pain 07/13/2016      1. Pre-op exam   2. Menorrhagia with regular cycle   3. Intramural leiomyoma of uterus   4. Right ovarian cyst   5. H/O bilateral salpingectomy         Plan:    Orders:     Meds ordered this encounter  Medications   metroNIDAZOLE  (FLAGYL ) 500 MG tablet      Sig: Take 1 tablet (500 mg total) by mouth 2 (two) times daily. Begin 5 days prior to scheduled surgery as directed.      Dispense:  10 tablet      Refill:  0      1.  LAVH RSO

## 2023-12-28 NOTE — Anesthesia Procedure Notes (Signed)
 Procedure Name: Intubation Date/Time: 12/28/2023 9:45 AM  Performed by: Coley Davis, CRNAPre-anesthesia Checklist: Patient identified, Emergency Drugs available, Suction available and Patient being monitored Patient Re-evaluated:Patient Re-evaluated prior to induction Oxygen Delivery Method: Circle system utilized Preoxygenation: Pre-oxygenation with 100% oxygen Induction Type: IV induction Ventilation: Mask ventilation without difficulty Laryngoscope Size: Mac and 4 Grade View: Grade I Tube type: Oral Tube size: 7.0 mm Number of attempts: 1 Airway Equipment and Method: Stylet and Oral airway Placement Confirmation: ETT inserted through vocal cords under direct vision, positive ETCO2 and breath sounds checked- equal and bilateral Secured at: 20 cm Tube secured with: Tape Dental Injury: Teeth and Oropharynx as per pre-operative assessment  Comments: Cords clear; no trauma. CA

## 2023-12-29 ENCOUNTER — Encounter: Payer: Self-pay | Admitting: Obstetrics and Gynecology

## 2023-12-29 ENCOUNTER — Telehealth: Payer: Self-pay

## 2023-12-29 LAB — SURGICAL PATHOLOGY

## 2023-12-29 NOTE — Telephone Encounter (Signed)
 Received call on triage line from patient who is one day post op vaginal hysterectomy and she says she feels like she is having trouble catching her breath.  She says it wasn't a sudden onset, but something she's noticed throughout the day.  She denies any chest pain, palpitations or blue tinged lips/fingers.  She says she can take deep breaths, but it makes her want to cough.  She says it is a wet cough but nothing comes out.  She does not have a fever.  She did vomit once this morning.  Overall she says she's feeling puny but as she expected after surgery.  Advised this sensation is likely caused from being under anesthesia during her surgery.  Counseled on staying hydrated, practicing her deep breaths, and coughing to loosen mucous.  Advised she should report to the ED if she experiences a sudden onset or increase of SOB, any chest pain, heart palpitations, discoloration of her nailbeds or lips, or if she develops a fever.  Encouraged her to call back with any additional concerns.  Liane Redman RN

## 2023-12-29 NOTE — Telephone Encounter (Signed)
 Dr. Luster Salters recommendations passed on verbally to [facility]. Pt to come in tomorrow or go to ED which worsening SOB, anxiety or abnormal leg pain.

## 2023-12-30 ENCOUNTER — Encounter: Payer: Self-pay | Admitting: Obstetrics and Gynecology

## 2023-12-30 ENCOUNTER — Ambulatory Visit

## 2023-12-30 ENCOUNTER — Ambulatory Visit: Admitting: Obstetrics and Gynecology

## 2023-12-30 ENCOUNTER — Ambulatory Visit: Payer: Self-pay

## 2023-12-30 VITALS — BP 106/70 | HR 64 | Ht 63.0 in | Wt 182.5 lb

## 2023-12-30 DIAGNOSIS — R300 Dysuria: Secondary | ICD-10-CM

## 2023-12-30 DIAGNOSIS — Z9889 Other specified postprocedural states: Secondary | ICD-10-CM

## 2023-12-30 DIAGNOSIS — R0602 Shortness of breath: Secondary | ICD-10-CM

## 2023-12-30 DIAGNOSIS — Z4889 Encounter for other specified surgical aftercare: Secondary | ICD-10-CM | POA: Diagnosis not present

## 2023-12-30 DIAGNOSIS — N898 Other specified noninflammatory disorders of vagina: Secondary | ICD-10-CM | POA: Diagnosis not present

## 2023-12-30 LAB — POCT URINALYSIS DIPSTICK
Bilirubin, UA: NEGATIVE
Glucose, UA: NEGATIVE
Ketones, UA: NEGATIVE
Leukocytes, UA: NEGATIVE
Nitrite, UA: NEGATIVE
Protein, UA: NEGATIVE
Spec Grav, UA: 1.015 (ref 1.010–1.025)
Urobilinogen, UA: 0.2 U/dL
pH, UA: 6 (ref 5.0–8.0)

## 2023-12-30 NOTE — Progress Notes (Signed)
 Patient presents for 2 day postop follow-up following LAVH/ RSO. She states no longer bleeding, pain is managed with medication but she states burning with urination. Concerns of shortness of breath yesterday and today reports it feels like something is sitting on her chest, has developed a cough since surgery as well.

## 2023-12-30 NOTE — Progress Notes (Signed)
 HPI:      [name] is a 38 y.o. [id number] who LMP was Patient's last menstrual period was 12/14/2023 (approximate).  Subjective:   She presents today because she called the office yesterday and said that she felt short of breath after her surgery.  She does report that it is significantly better today.  She still feels as if it is hard to take a deep breath. Her pain is controlled, she is eating and voiding without difficulty.    Hx: The following portions of the patient's history were reviewed and updated as appropriate:             She  has a past medical history of ADHD, Anemia, Dysfunctional uterine bleeding (11/2023), PONV (postoperative nausea and vomiting), Right ovarian cyst (11/2023), and Type AB blood, Rh negative. She does not have any pertinent problems on file. She  has a past surgical history that includes Cholecystectomy (01/2005); Sleeve Gastroplasty (10/2020); Laparoscopic bilateral salpingectomy (Bilateral, 06/13/2021); and Laparoscopic vaginal hysterectomy with salpingo oophorectomy (N/A, 12/28/2023). Her family history includes Cancer (age of onset: 81) in her mother; Diabetes in her maternal grandfather; Suicidality in her father. She  reports that she has never smoked. She has never used smokeless tobacco. She reports current alcohol use. She reports that she does not use drugs. She has a current medication list which includes the following prescription(s): amphetamine -dextroamphetamine , biotin, epipen  2-pak, hydrocodone-acetaminophen , levocetirizine, linaclotide , omeprazole , and ondansetron . She is allergic to bee venom and latex.       Review of Systems:  Review of Systems  Constitutional: Denied constitutional symptoms, night sweats, recent illness, fatigue, fever, insomnia and weight loss.  Eyes: Denied eye symptoms, eye pain, photophobia, vision change and visual disturbance.  Ears/Nose/Throat/Neck: Denied ear, nose, throat or neck symptoms, hearing loss, nasal  discharge, sinus congestion and sore throat.  Cardiovascular: Denied cardiovascular symptoms, arrhythmia, chest pain/pressure, edema, exercise intolerance, orthopnea and palpitations.  Respiratory: Denied pulmonary symptoms, asthma, pleuritic pain, productive sputum, cough, dyspnea and wheezing.  Gastrointestinal: Denied, gastro-esophageal reflux, melena, nausea and vomiting.  Genitourinary: Denied genitourinary symptoms including symptomatic vaginal discharge, pelvic relaxation issues, and urinary complaints.  Musculoskeletal: Denied musculoskeletal symptoms, stiffness, swelling, muscle weakness and myalgia.  Dermatologic: Denied dermatology symptoms, rash and scar.  Neurologic: Denied neurology symptoms, dizziness, headache, neck pain and syncope.  Psychiatric: Denied psychiatric symptoms, anxiety and depression.  Endocrine: Denied endocrine symptoms including hot flashes and night sweats.   Meds:   Current Outpatient Medications on File Prior to Visit  Medication Sig Dispense Refill   amphetamine -dextroamphetamine  (ADDERALL) 20 MG tablet Take 1 tablet (20 mg total) by mouth 2 (two) times daily. 60 tablet 0   BIOTIN PO Take 1 capsule by mouth daily.     EPIPEN  2-PAK 0.3 MG/0.3ML SOAJ injection Inject 0.3 mg into the muscle as needed for anaphylaxis.     HYDROcodone-acetaminophen  (NORCO/VICODIN) 5-325 MG tablet Take 1-2 tablets by mouth every 6 (six) hours as needed for moderate pain (pain score 4-6). 25 tablet 0   levocetirizine (XYZAL ) 5 MG tablet Take 1 tablet (5 mg total) by mouth every evening. 90 tablet 3   linaclotide  (LINZESS ) 145 MCG CAPS capsule Take 1 capsule (145 mcg total) by mouth daily before breakfast. 90 capsule 0   omeprazole  (PRILOSEC) 40 MG capsule Take 1 capsule (40 mg total) by mouth daily. 90 capsule 1   ondansetron  (ZOFRAN ) 4 MG tablet Take 1 tablet (4 mg total) by mouth every 8 (eight) hours as needed for nausea  or vomiting. 10 tablet 0   No current  facility-administered medications on file prior to visit.      Objective:     Vitals:   12/30/23 1115  BP: 106/70  Pulse: 64   Filed Weights   12/30/23 1115  Weight: 182 lb 8 oz (82.8 kg)              Lungs are clear in all quadrants          There is no calf tenderness or cords palpated.  Both lower extremities are of the same size.           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 Postoperative state   2. SOB (shortness of breath)   3. Dysuria   4. Vaginal irritation     Strongly doubt PE.  Doubt CHF.  Possibly some effect from intubation.   Plan:            1.  Discussed care with the patient.  She is expected to continue to improve daily.  If she does not or if she becomes worse I have advised her to go immediately to the emergency department. Plan follow-up as previously scheduled for next week. Orders Orders Placed This Encounter  Procedures   POCT Urinalysis Dipstick    No orders of the defined types were placed in this encounter.     F/U  Return for As Scheduled Post-op.  Delice Felt, M.D. 12/30/2023 11:48 AM

## 2023-12-31 DIAGNOSIS — J301 Allergic rhinitis due to pollen: Secondary | ICD-10-CM | POA: Diagnosis not present

## 2024-01-01 ENCOUNTER — Encounter: Payer: Self-pay | Admitting: Physician Assistant

## 2024-01-01 ENCOUNTER — Ambulatory Visit: Admitting: Physician Assistant

## 2024-01-01 VITALS — BP 113/70 | HR 80 | Temp 97.9°F | Resp 16 | Ht 63.0 in | Wt 181.0 lb

## 2024-01-01 DIAGNOSIS — E66811 Obesity, class 1: Secondary | ICD-10-CM

## 2024-01-01 DIAGNOSIS — F988 Other specified behavioral and emotional disorders with onset usually occurring in childhood and adolescence: Secondary | ICD-10-CM

## 2024-01-01 DIAGNOSIS — Z90710 Acquired absence of both cervix and uterus: Secondary | ICD-10-CM | POA: Diagnosis not present

## 2024-01-01 MED ORDER — AMPHETAMINE-DEXTROAMPHETAMINE 20 MG PO TABS: 20.0000 mg | ORAL_TABLET | Freq: Two times a day (BID) | ORAL | 0 refills | Status: DC

## 2024-01-01 MED ORDER — ZEPBOUND 2.5 MG/0.5ML ~~LOC~~ SOAJ: 2.5000 mg | SUBCUTANEOUS | 2 refills | Status: DC

## 2024-01-01 NOTE — Progress Notes (Signed)
 [facility] [address] [place], [place] [address]  Internal MEDICINE  Office Visit Note  Patient Name: Paula Sullivan  [id number]  [id number]  Date of Service: 01/01/2024  Chief Complaint  Patient presents with   Follow-up   Medication Refill    HPI Pt is here for routine follow up -Recently had her hysterectomy and has a follow up on Tuesday -stopped topamax  due to getting headaches -Interested in wt loss injections, tried to ask insurance about coverage but never got an answer. Told it would have to be sent in first -no hx or Fhx of thyroid CA -pt aware she should not start medication until fully recovered from surgery, but given approval time typically can take awhile, will go ahead and send in to start process -doing well with adderall though holding during recovery currently, will need future refill sent  Current Medication: Outpatient Encounter Medications as of 01/01/2024  Medication Sig   BIOTIN PO Take 1 capsule by mouth daily.   EPIPEN  2-PAK 0.3 MG/0.3ML SOAJ injection Inject 0.3 mg into the muscle as needed for anaphylaxis.   HYDROcodone-acetaminophen  (NORCO/VICODIN) 5-325 MG tablet Take 1-2 tablets by mouth every 6 (six) hours as needed for moderate pain (pain score 4-6).   levocetirizine (XYZAL ) 5 MG tablet Take 1 tablet (5 mg total) by mouth every evening.   linaclotide  (LINZESS ) 145 MCG CAPS capsule Take 1 capsule (145 mcg total) by mouth daily before breakfast.   omeprazole  (PRILOSEC) 40 MG capsule Take 1 capsule (40 mg total) by mouth daily.   ondansetron  (ZOFRAN ) 4 MG tablet Take 1 tablet (4 mg total) by mouth every 8 (eight) hours as needed for nausea or vomiting.   tirzepatide (ZEPBOUND) 2.5 MG/0.5ML Pen Inject 2.5 mg into the skin once a week.   [DISCONTINUED] amphetamine -dextroamphetamine  (ADDERALL) 20 MG tablet Take 1 tablet (20 mg total) by mouth 2 (two) times daily.   [START ON 01/14/2024] amphetamine -dextroamphetamine  (ADDERALL) 20 MG tablet  Take 1 tablet (20 mg total) by mouth 2 (two) times daily.   No facility-administered encounter medications on file as of 01/01/2024.    Surgical History: Past Surgical History:  Procedure Laterality Date   CHOLECYSTECTOMY  01/2005   LAPAROSCOPIC BILATERAL SALPINGECTOMY Bilateral 06/13/2021   Procedure: LAPAROSCOPIC BILATERAL SALPINGECTOMY;  Surgeon: Alben Alma, MD;  Location: [facility] ORS;  Service: Gynecology;  Laterality: Bilateral;   LAPAROSCOPIC VAGINAL HYSTERECTOMY WITH SALPINGO OOPHORECTOMY N/A 12/28/2023   Procedure: HYSTERECTOMY, VAGINAL, LAPAROSCOPY-ASSISTED, WITH SALPINGO-OOPHORECTOMY;  Surgeon: Zenobia Hila, MD;  Location: [facility] ORS;  Service: Gynecology;  Laterality: N/A;  LAVH RSO   SLEEVE GASTROPLASTY  10/2020    Medical History: Past Medical History:  Diagnosis Date   ADHD    Anemia    Dysfunctional uterine bleeding 11/2023   PONV (postoperative nausea and vomiting)    Right ovarian cyst 11/2023   Type AB blood, Rh negative     Family History: Family History  Problem Relation Age of Onset   Suicidality Father        2011   Diabetes Maternal Grandfather    Cancer Mother 91       hyst--? cervical    Social History   Socioeconomic History   Marital status: Single    Spouse name: Not on file   Number of children: 1   Years of education: Not on file   Highest education level: Not on file  Occupational History   Not on file  Tobacco Use   Smoking status: Never  Smokeless tobacco: Never  Vaping Use   Vaping status: Never Used  Substance and Sexual Activity   Alcohol use: Yes    Comment: Rarely   Drug use: No   Sexual activity: Not Currently    Birth control/protection: Surgical    Comment: BTL/Hysterectomy 12/28/23  Other Topics Concern   Not on file  Social History Narrative   Not on file   Social Drivers of Health   Financial Resource Strain: Not on file  Food Insecurity: Not on file  Transportation Needs: Not on file  Physical  Activity: Not on file  Stress: Not on file  Social Connections: Not on file  Intimate Partner Violence: Not on file      Review of Systems  Constitutional:  Negative for chills and unexpected weight change.  HENT:  Negative for congestion, rhinorrhea, sneezing and sore throat.   Eyes:  Negative for redness.  Respiratory:  Negative for cough, chest tightness and shortness of breath.   Cardiovascular:  Negative for chest pain and palpitations.  Gastrointestinal:  Negative for constipation, diarrhea, nausea and vomiting.  Genitourinary:  Negative for dysuria and frequency.  Musculoskeletal:  Negative for arthralgias, back pain, joint swelling and neck pain.  Skin:  Negative for rash.  Neurological: Negative.  Negative for tremors and numbness.  Hematological:  Negative for adenopathy. Does not bruise/bleed easily.  Psychiatric/Behavioral:  Negative for behavioral problems (Depression), sleep disturbance and suicidal ideas. The patient is not nervous/anxious.     Vital Signs: BP 113/70   Pulse 80   Temp 97.9 F (36.6 C)   Resp 16   Ht 5' 3 (1.6 m)   Wt 181 lb (82.1 kg)   LMP 12/14/2023 (Approximate)   SpO2 99%   BMI 32.06 kg/m    Physical Exam Vitals reviewed.  Constitutional:      General: She is not in acute distress.    Appearance: Normal appearance. She is not ill-appearing.  HENT:     Head: Normocephalic and atraumatic.   Eyes:     Extraocular Movements: Extraocular movements intact.    Cardiovascular:     Rate and Rhythm: Normal rate and regular rhythm.  Pulmonary:     Effort: Pulmonary effort is normal. No respiratory distress.   Skin:    General: Skin is warm and dry.   Neurological:     Mental Status: She is alert and oriented to person, place, and time.   Psychiatric:        Mood and Affect: Mood normal.        Behavior: Behavior normal.        Assessment/Plan: 1. Attention deficit disorder (ADD) in adult (Primary) Continue adderall as  before - amphetamine -dextroamphetamine  (ADDERALL) 20 MG tablet; Take 1 tablet (20 mg total) by mouth 2 (two) times daily.  Dispense: 60 tablet; Refill: 0  2. S/P hysterectomy Followed by GYN  3. Obesity (BMI 30.0-34.9) Advised not to start zepbound until fully recovered from surgery, but will send now to start approval process - tirzepatide (ZEPBOUND) 2.5 MG/0.5ML Pen; Inject 2.5 mg into the skin once a week.  Dispense: 2 mL; Refill: 2   General Counseling: Tayllor verbalizes understanding of the findings of todays visit and agrees with plan of treatment. I have discussed any further diagnostic evaluation that may be needed or ordered today. We also reviewed her medications today. she has been encouraged to call the office with any questions or concerns that should arise related to todays visit.    No  orders of the defined types were placed in this encounter.   Meds ordered this encounter  Medications   amphetamine -dextroamphetamine  (ADDERALL) 20 MG tablet    Sig: Take 1 tablet (20 mg total) by mouth 2 (two) times daily.    Dispense:  60 tablet    Refill:  0   tirzepatide (ZEPBOUND) 2.5 MG/0.5ML Pen    Sig: Inject 2.5 mg into the skin once a week.    Dispense:  2 mL    Refill:  2    This patient was seen by Taylor Favia, PA-C in collaboration with Dr. Verneta Gone as a part of collaborative care agreement.   Total time spent:30 Minutes Time spent includes review of chart, medications, test results, and follow up plan with the patient.      Dr Fozia M Khan Internal medicine

## 2024-01-04 ENCOUNTER — Telehealth: Payer: Self-pay

## 2024-01-04 NOTE — Telephone Encounter (Signed)
Completed P.A. for patient's Zepbound. 

## 2024-01-05 ENCOUNTER — Telehealth: Payer: Self-pay

## 2024-01-05 ENCOUNTER — Encounter: Payer: Self-pay | Admitting: Obstetrics and Gynecology

## 2024-01-05 ENCOUNTER — Ambulatory Visit: Admitting: Obstetrics and Gynecology

## 2024-01-05 ENCOUNTER — Other Ambulatory Visit: Payer: Self-pay

## 2024-01-05 VITALS — BP 114/75 | HR 78 | Ht 63.0 in | Wt 183.5 lb

## 2024-01-05 DIAGNOSIS — Z9889 Other specified postprocedural states: Secondary | ICD-10-CM

## 2024-01-05 DIAGNOSIS — Z4889 Encounter for other specified surgical aftercare: Secondary | ICD-10-CM

## 2024-01-05 MED ORDER — SEMAGLUTIDE-WEIGHT MANAGEMENT 0.25 MG/0.5ML ~~LOC~~ SOAJ
0.2500 mg | SUBCUTANEOUS | 2 refills | Status: DC
Start: 2024-01-05 — End: 2024-03-03

## 2024-01-05 NOTE — Telephone Encounter (Signed)
 Patient's insurance not willing to cover Zepbound , but willing to cover [facility]. Per Tinnie, ok to send, notified patient.

## 2024-01-05 NOTE — Telephone Encounter (Signed)
 Done

## 2024-01-05 NOTE — Progress Notes (Signed)
 HPI:      [name] is a 37 y.o. [id number] who LMP was Patient's last menstrual period was 12/14/2023 (approximate).  Subjective:   She presents today 1 week from hysterectomy.  She reports she is doing well without issue.  She is no longer using pain medication.  She denies vaginal bleeding.  She is voiding without issue bowel movements without issue.    Hx: The following portions of the patient's history were reviewed and updated as appropriate:             She  has a past medical history of ADHD, Anemia, Dysfunctional uterine bleeding (11/2023), PONV (postoperative nausea and vomiting), Right ovarian cyst (11/2023), and Type AB blood, Rh negative. She does not have any pertinent problems on file. She  has a past surgical history that includes Cholecystectomy (01/2005); Sleeve Gastroplasty (10/2020); Laparoscopic bilateral salpingectomy (Bilateral, 06/13/2021); and Laparoscopic vaginal hysterectomy with salpingo oophorectomy (N/A, 12/28/2023). Her family history includes Cancer (age of onset: 14) in her mother; Diabetes in her maternal grandfather; Suicidality in her father. She  reports that she has never smoked. She has never used smokeless tobacco. She reports current alcohol use. She reports that she does not use drugs. She has a current medication list which includes the following prescription(s): [START ON 01/14/2024] amphetamine -dextroamphetamine , biotin, epipen  2-pak, hydrocodone-acetaminophen , levocetirizine, linaclotide , omeprazole , ondansetron , and semaglutide-weight management. She is allergic to bee venom and latex.       Review of Systems:  Review of Systems  Constitutional: Denied constitutional symptoms, night sweats, recent illness, fatigue, fever, insomnia and weight loss.  Eyes: Denied eye symptoms, eye pain, photophobia, vision change and visual disturbance.  Ears/Nose/Throat/Neck: Denied ear, nose, throat or neck symptoms, hearing loss, nasal discharge, sinus  congestion and sore throat.  Cardiovascular: Denied cardiovascular symptoms, arrhythmia, chest pain/pressure, edema, exercise intolerance, orthopnea and palpitations.  Respiratory: Denied pulmonary symptoms, asthma, pleuritic pain, productive sputum, cough, dyspnea and wheezing.  Gastrointestinal: Denied, gastro-esophageal reflux, melena, nausea and vomiting.  Genitourinary: Denied genitourinary symptoms including symptomatic vaginal discharge, pelvic relaxation issues, and urinary complaints.  Musculoskeletal: Denied musculoskeletal symptoms, stiffness, swelling, muscle weakness and myalgia.  Dermatologic: Denied dermatology symptoms, rash and scar.  Neurologic: Denied neurology symptoms, dizziness, headache, neck pain and syncope.  Psychiatric: Denied psychiatric symptoms, anxiety and depression.  Endocrine: Denied endocrine symptoms including hot flashes and night sweats.   Meds:   Current Outpatient Medications on File Prior to Visit  Medication Sig Dispense Refill   [START ON 01/14/2024] amphetamine -dextroamphetamine  (ADDERALL) 20 MG tablet Take 1 tablet (20 mg total) by mouth 2 (two) times daily. 60 tablet 0   BIOTIN PO Take 1 capsule by mouth daily.     EPIPEN  2-PAK 0.3 MG/0.3ML SOAJ injection Inject 0.3 mg into the muscle as needed for anaphylaxis.     HYDROcodone-acetaminophen  (NORCO/VICODIN) 5-325 MG tablet Take 1-2 tablets by mouth every 6 (six) hours as needed for moderate pain (pain score 4-6). 25 tablet 0   levocetirizine (XYZAL ) 5 MG tablet Take 1 tablet (5 mg total) by mouth every evening. 90 tablet 3   linaclotide  (LINZESS ) 145 MCG CAPS capsule Take 1 capsule (145 mcg total) by mouth daily before breakfast. 90 capsule 0   omeprazole  (PRILOSEC) 40 MG capsule Take 1 capsule (40 mg total) by mouth daily. 90 capsule 1   ondansetron  (ZOFRAN ) 4 MG tablet Take 1 tablet (4 mg total) by mouth every 8 (eight) hours as needed for nausea or vomiting. 10 tablet 0   No  current  facility-administered medications on file prior to visit.      Objective:     Vitals:   01/05/24 1533  BP: 114/75  Pulse: 78   Filed Weights   01/05/24 1533  Weight: 183 lb 8 oz (83.2 kg)               Abdomen: Soft.  Non-tender.  No masses.  No HSM.  Incision/s: Intact.  Healing well.  No erythema.  No drainage.        Post Assessment:    [id number] Patient Active Problem List   Diagnosis Date Noted   BV (bacterial vaginosis) 05/28/2023   Menorrhagia with regular cycle 05/28/2023   Cough 05/27/2022   Right knee pain 10/28/2021   S/P tubal ligation 06/26/2021   Sterilization consult    Dysplasia of cervix, low grade (CIN 1) 02/27/2021   Cervical high risk human papillomavirus (HPV) DNA test positive 02/15/2020   Abnormal weight gain 01/09/2020   Attention deficit disorder (ADD) in adult 01/09/2020   Primary osteoarthritis of left knee 02/05/2018   Sprain of lateral collateral ligament of left knee 02/05/2018   Tear of medial meniscus of left knee, current 02/05/2018   IUD check up 01/12/2017   Headache 07/23/2016   Lower abdominal pain 07/13/2016     1. Postoperative state     Excellent recovery postop   Plan:            1.  Surgery discussed-pathology reviewed including adenomyosis and cervical dysplasia.  2.  Discussed limitations after surgery.  3.  Wound care discussed Orders No orders of the defined types were placed in this encounter.   No orders of the defined types were placed in this encounter.     F/U  Return in about 5 weeks (around 02/09/2024).  Alm DOROTHA Sar, M.D. 01/05/2024 4:09 PM

## 2024-01-05 NOTE — Telephone Encounter (Addendum)
 Completed P.A. for patient's Paula Sullivan. Patient approved.

## 2024-01-05 NOTE — Progress Notes (Signed)
 Patient presents for 1 week postop follow-up following LAVH/RSO. She states feeling better other than a burning sensation around her umbilical incision. No additional concerns.

## 2024-01-07 DIAGNOSIS — J301 Allergic rhinitis due to pollen: Secondary | ICD-10-CM | POA: Diagnosis not present

## 2024-01-08 ENCOUNTER — Telehealth: Payer: Self-pay

## 2024-01-08 DIAGNOSIS — B379 Candidiasis, unspecified: Secondary | ICD-10-CM

## 2024-01-08 MED ORDER — FLUCONAZOLE 150 MG PO TABS: 150.0000 mg | ORAL_TABLET | Freq: Every day | ORAL | 0 refills | Status: DC

## 2024-01-08 NOTE — Telephone Encounter (Signed)
 Patient calling in 1.5 weeks post op from hysterectomy. Reports vaginal itching along with an increased discharge. States similar feelings to a yeast infection she has had before. Due to recent surgery, 1 diflucan  pill sent to pharmacy. Aware to call back if symptoms do not resolve next week.

## 2024-01-12 MED ORDER — FLUCONAZOLE 150 MG PO TABS: 150.0000 mg | ORAL_TABLET | Freq: Every day | ORAL | 1 refills | Status: DC

## 2024-01-12 NOTE — Telephone Encounter (Signed)
 Spoke with patient. She states the symptoms were a little better but she still has lingering symptoms of the itching/irritation. She states she usually has to take a couple Diflucan  to resolve infections she has had in the past. Advised will send refill. If she is not better in a week, will need evaluation.

## 2024-01-12 NOTE — Addendum Note (Signed)
 Addended by: TAFT CAMELIA MATSU on: 01/12/2024 11:33 AM   Modules accepted: Orders

## 2024-01-12 NOTE — Telephone Encounter (Signed)
 TRIAGE VOICEMAIL: Patient states she had a hysterectomy two weeks ago. She had a rx sent in last Friday for yeast infection. She is still having the same issue. It's not really getting any better.

## 2024-01-14 DIAGNOSIS — J301 Allergic rhinitis due to pollen: Secondary | ICD-10-CM | POA: Diagnosis not present

## 2024-01-21 DIAGNOSIS — J301 Allergic rhinitis due to pollen: Secondary | ICD-10-CM | POA: Diagnosis not present

## 2024-01-27 ENCOUNTER — Ambulatory Visit (INDEPENDENT_AMBULATORY_CARE_PROVIDER_SITE_OTHER)

## 2024-01-27 DIAGNOSIS — J301 Allergic rhinitis due to pollen: Secondary | ICD-10-CM | POA: Diagnosis not present

## 2024-01-27 DIAGNOSIS — E538 Deficiency of other specified B group vitamins: Secondary | ICD-10-CM | POA: Diagnosis not present

## 2024-01-27 MED ORDER — CYANOCOBALAMIN 1000 MCG/ML IJ SOLN
1000.0000 ug | Freq: Once | INTRAMUSCULAR | Status: AC
  Administered 2024-01-27: 1000 ug via INTRAMUSCULAR

## 2024-01-28 DIAGNOSIS — J301 Allergic rhinitis due to pollen: Secondary | ICD-10-CM | POA: Diagnosis not present

## 2024-02-04 ENCOUNTER — Encounter: Payer: Self-pay | Admitting: Obstetrics and Gynecology

## 2024-02-04 ENCOUNTER — Ambulatory Visit: Admitting: Obstetrics and Gynecology

## 2024-02-04 VITALS — BP 105/72 | HR 85 | Ht 63.0 in | Wt 176.7 lb

## 2024-02-04 DIAGNOSIS — J301 Allergic rhinitis due to pollen: Secondary | ICD-10-CM | POA: Diagnosis not present

## 2024-02-04 DIAGNOSIS — Z4889 Encounter for other specified surgical aftercare: Secondary | ICD-10-CM

## 2024-02-04 DIAGNOSIS — Z9889 Other specified postprocedural states: Secondary | ICD-10-CM

## 2024-02-04 NOTE — Progress Notes (Signed)
 Patient presents for 6  week postop follow-up following LAVH/RSO. She states doing much better, still experiences spotting occasionally.

## 2024-02-04 NOTE — Progress Notes (Signed)
 HPI:      [name] is a 38 y.o. [id number] who LMP was Patient's last menstrual period was 12/14/2023 (approximate).  Subjective:   She presents today almost 6 weeks postop from LAVH RSO.  She reports she is doing much better.  She feels well.  She resumed most normal activities including mowing the yard.  She occasionally has some vaginal spotting but no other issues.    Hx: The following portions of the patient's history were reviewed and updated as appropriate:             She  has a past medical history of ADHD, Anemia, Dysfunctional uterine bleeding (11/2023), PONV (postoperative nausea and vomiting), Right ovarian cyst (11/2023), and Type AB blood, Rh negative. She does not have any pertinent problems on file. She  has a past surgical history that includes Cholecystectomy (01/2005); Sleeve Gastroplasty (10/2020); Laparoscopic bilateral salpingectomy (Bilateral, 06/13/2021); and Laparoscopic vaginal hysterectomy with salpingo oophorectomy (N/A, 12/28/2023). Her family history includes Cancer (age of onset: 36) in her mother; Diabetes in her maternal grandfather; Suicidality in her father. She  reports that she has never smoked. She has never used smokeless tobacco. She reports current alcohol use. She reports that she does not use drugs. She has a current medication list which includes the following prescription(s): amphetamine -dextroamphetamine , biotin, epipen  2-pak, fluconazole , levocetirizine, linaclotide , omeprazole , ondansetron , and semaglutide -weight management. She is allergic to bee venom and latex.       Review of Systems:  Review of Systems  Constitutional: Denied constitutional symptoms, night sweats, recent illness, fatigue, fever, insomnia and weight loss.  Eyes: Denied eye symptoms, eye pain, photophobia, vision change and visual disturbance.  Ears/Nose/Throat/Neck: Denied ear, nose, throat or neck symptoms, hearing loss, nasal discharge, sinus congestion and sore  throat.  Cardiovascular: Denied cardiovascular symptoms, arrhythmia, chest pain/pressure, edema, exercise intolerance, orthopnea and palpitations.  Respiratory: Denied pulmonary symptoms, asthma, pleuritic pain, productive sputum, cough, dyspnea and wheezing.  Gastrointestinal: Denied, gastro-esophageal reflux, melena, nausea and vomiting.  Genitourinary: Denied genitourinary symptoms including symptomatic vaginal discharge, pelvic relaxation issues, and urinary complaints.  Musculoskeletal: Denied musculoskeletal symptoms, stiffness, swelling, muscle weakness and myalgia.  Dermatologic: Denied dermatology symptoms, rash and scar.  Neurologic: Denied neurology symptoms, dizziness, headache, neck pain and syncope.  Psychiatric: Denied psychiatric symptoms, anxiety and depression.  Endocrine: Denied endocrine symptoms including hot flashes and night sweats.   Meds:   Current Outpatient Medications on File Prior to Visit  Medication Sig Dispense Refill   amphetamine -dextroamphetamine  (ADDERALL) 20 MG tablet Take 1 tablet (20 mg total) by mouth 2 (two) times daily. 60 tablet 0   BIOTIN PO Take 1 capsule by mouth daily.     EPIPEN  2-PAK 0.3 MG/0.3ML SOAJ injection Inject 0.3 mg into the muscle as needed for anaphylaxis.     fluconazole  (DIFLUCAN ) 150 MG tablet Take 1 tablet (150 mg total) by mouth daily. 1 tablet 1   levocetirizine (XYZAL ) 5 MG tablet Take 1 tablet (5 mg total) by mouth every evening. 90 tablet 3   linaclotide  (LINZESS ) 145 MCG CAPS capsule Take 1 capsule (145 mcg total) by mouth daily before breakfast. 90 capsule 0   omeprazole  (PRILOSEC) 40 MG capsule Take 1 capsule (40 mg total) by mouth daily. 90 capsule 1   ondansetron  (ZOFRAN ) 4 MG tablet Take 1 tablet (4 mg total) by mouth every 8 (eight) hours as needed for nausea or vomiting. 10 tablet 0   Semaglutide -Weight Management 0.25 MG/0.5ML SOAJ Inject 0.25 mg into the skin  once a week. 2 mL 2   No current facility-administered  medications on file prior to visit.      Objective:     Vitals:   02/04/24 1002  BP: 105/72  Pulse: 85   Filed Weights   02/04/24 1002  Weight: 176 lb 11.2 oz (80.2 kg)              Physical examination   Pelvic:   Vulva: Normal appearance.  No lesions.  Vagina: No lesions or abnormalities noted.  Sutures are still evident at the vaginal cuff.  Support: Normal pelvic support.  Urethra No masses tenderness or scarring.  Meatus Normal size without lesions or prolapse.  Cervix: Surgically absent  Anus: Normal exam.  No lesions.  Perineum: Normal exam.  No lesions.            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 Postoperative state     Doing very well postop.   Plan:            1.  May resume normal activities with exception of intercourse.  I have advised her to wait 2 more weeks before intercourse.   Orders No orders of the defined types were placed in this encounter.   No orders of the defined types were placed in this encounter.     F/U  Return in about 3 months (around 05/06/2024).  Alm DOROTHA Sar, M.D. 02/04/2024 10:14 AM

## 2024-02-05 ENCOUNTER — Encounter: Payer: Self-pay | Admitting: Obstetrics and Gynecology

## 2024-02-11 DIAGNOSIS — J301 Allergic rhinitis due to pollen: Secondary | ICD-10-CM | POA: Diagnosis not present

## 2024-02-18 ENCOUNTER — Ambulatory Visit (INDEPENDENT_AMBULATORY_CARE_PROVIDER_SITE_OTHER): Admitting: Physician Assistant

## 2024-02-18 ENCOUNTER — Encounter: Payer: Self-pay | Admitting: Physician Assistant

## 2024-02-18 VITALS — BP 111/73 | HR 88 | Temp 97.9°F | Resp 16 | Ht 63.0 in | Wt 175.4 lb

## 2024-02-18 DIAGNOSIS — H9209 Otalgia, unspecified ear: Secondary | ICD-10-CM

## 2024-02-18 DIAGNOSIS — R197 Diarrhea, unspecified: Secondary | ICD-10-CM | POA: Diagnosis not present

## 2024-02-18 DIAGNOSIS — J029 Acute pharyngitis, unspecified: Secondary | ICD-10-CM

## 2024-02-18 LAB — POCT RAPID STREP A (OFFICE): Rapid Strep A Screen: NEGATIVE

## 2024-02-18 MED ORDER — AZITHROMYCIN 250 MG PO TABS: ORAL_TABLET | ORAL | 0 refills | Status: AC

## 2024-02-18 NOTE — Progress Notes (Signed)
 [facility] [address] [place], [place] [address]  Internal MEDICINE  Office Visit Note  Patient Name: Paula Sullivan  [id number]  [id number]  Date of Service: 02/18/2024  Chief Complaint  Patient presents with   Acute Visit   Otalgia   Sore Throat   Nausea     HPI Pt is here for a sick visit. -Ear pain on left side for the last 1.5 weeks, since last Monday. Pain is below ear with swallowing, but doesn't really feel like a sore throat -Some chills, some nausea, diarrhea past few days. No abdominal pain -kids started school 2 weeks ago and did states some classmates were sick -staying hydrated -taking excedrin as needed -No cough or SOB  Current Medication:  Outpatient Encounter Medications as of 02/18/2024  Medication Sig   amphetamine -dextroamphetamine  (ADDERALL) 20 MG tablet Take 1 tablet (20 mg total) by mouth 2 (two) times daily.   azithromycin  (ZITHROMAX ) 250 MG tablet Take 2 tablets on day 1, then 1 tablet daily on days 2 through 5   BIOTIN PO Take 1 capsule by mouth daily.   EPIPEN  2-PAK 0.3 MG/0.3ML SOAJ injection Inject 0.3 mg into the muscle as needed for anaphylaxis.   fluconazole  (DIFLUCAN ) 150 MG tablet Take 1 tablet (150 mg total) by mouth daily.   levocetirizine (XYZAL ) 5 MG tablet Take 1 tablet (5 mg total) by mouth every evening.   linaclotide  (LINZESS ) 145 MCG CAPS capsule Take 1 capsule (145 mcg total) by mouth daily before breakfast.   omeprazole  (PRILOSEC) 40 MG capsule Take 1 capsule (40 mg total) by mouth daily.   ondansetron  (ZOFRAN ) 4 MG tablet Take 1 tablet (4 mg total) by mouth every 8 (eight) hours as needed for nausea or vomiting.   Semaglutide -Weight Management 0.25 MG/0.5ML SOAJ Inject 0.25 mg into the skin once a week.   No facility-administered encounter medications on file as of 02/18/2024.      Medical History: Past Medical History:  Diagnosis Date   ADHD    Anemia    Dysfunctional uterine bleeding 11/2023   PONV  (postoperative nausea and vomiting)    Right ovarian cyst 11/2023   Type AB blood, Rh negative      Vital Signs: BP 111/73   Pulse 88   Temp 97.9 F (36.6 C)   Resp 16   Ht 5' 3 (1.6 m)   Wt 175 lb 6.4 oz (79.6 kg)   LMP 12/14/2023 (Approximate)   SpO2 98%   BMI 31.07 kg/m    Review of Systems  Constitutional:  Positive for chills. Negative for fatigue and fever.  HENT:  Positive for congestion, ear pain, postnasal drip and sore throat. Negative for mouth sores and trouble swallowing.   Respiratory:  Negative for cough.   Cardiovascular:  Negative for chest pain.  Gastrointestinal:  Positive for diarrhea and nausea. Negative for abdominal pain and vomiting.  Genitourinary:  Negative for flank pain.  Psychiatric/Behavioral: Negative.      Physical Exam Vitals and nursing note reviewed.  Constitutional:      General: She is not in acute distress.    Appearance: Normal appearance. She is not ill-appearing.  HENT:     Head: Normocephalic and atraumatic.     Right Ear: Tympanic membrane normal.     Left Ear: Tympanic membrane normal.     Mouth/Throat:     Pharynx: Posterior oropharyngeal erythema present.  Eyes:     Extraocular Movements: Extraocular movements intact.  Cardiovascular:  Rate and Rhythm: Normal rate and regular rhythm.  Pulmonary:     Effort: Pulmonary effort is normal. No respiratory distress.  Abdominal:     Tenderness: There is no abdominal tenderness.  Skin:    General: Skin is warm and dry.  Neurological:     Mental Status: She is alert and oriented to person, place, and time.  Psychiatric:        Mood and Affect: Mood normal.        Behavior: Behavior normal.       Assessment/Plan: 1. Ear ache (Primary) Likely secondary to viral infection and drainage, will go ahead and send zpak for secondary bacterial infection - POCT rapid strep A - azithromycin  (ZITHROMAX ) 250 MG tablet; Take 2 tablets on day 1, then 1 tablet daily on days 2  through 5  Dispense: 6 tablet; Refill: 0  2. Sore throat Stay hydrated and will treat with zpak  3. Diarrhea, unspecified type Stay well hydrated and monitor   General Counseling: Paula Sullivan verbalizes understanding of the findings of todays visit and agrees with plan of treatment. I have discussed any further diagnostic evaluation that may be needed or ordered today. We also reviewed her medications today. she has been encouraged to call the office with any questions or concerns that should arise related to todays visit.    Counseling:    Orders Placed This Encounter  Procedures   POCT rapid strep A    Meds ordered this encounter  Medications   azithromycin  (ZITHROMAX ) 250 MG tablet    Sig: Take 2 tablets on day 1, then 1 tablet daily on days 2 through 5    Dispense:  6 tablet    Refill:  0    Time spent:30 Minutes

## 2024-02-22 ENCOUNTER — Encounter: Payer: Self-pay | Admitting: Physician Assistant

## 2024-02-23 ENCOUNTER — Other Ambulatory Visit: Payer: Self-pay | Admitting: Physician Assistant

## 2024-02-23 DIAGNOSIS — F988 Other specified behavioral and emotional disorders with onset usually occurring in childhood and adolescence: Secondary | ICD-10-CM

## 2024-02-23 MED ORDER — AMPHETAMINE-DEXTROAMPHETAMINE 20 MG PO TABS
20.0000 mg | ORAL_TABLET | Freq: Two times a day (BID) | ORAL | 0 refills | Status: DC
Start: 2024-02-23 — End: 2024-04-21

## 2024-02-23 MED ORDER — AMPHETAMINE-DEXTROAMPHETAMINE 20 MG PO TABS: 20.0000 mg | ORAL_TABLET | Freq: Two times a day (BID) | ORAL | 0 refills | Status: DC

## 2024-02-25 DIAGNOSIS — J301 Allergic rhinitis due to pollen: Secondary | ICD-10-CM | POA: Diagnosis not present

## 2024-03-02 ENCOUNTER — Ambulatory Visit

## 2024-03-03 ENCOUNTER — Encounter: Payer: Self-pay | Admitting: Physician Assistant

## 2024-03-03 ENCOUNTER — Ambulatory Visit (INDEPENDENT_AMBULATORY_CARE_PROVIDER_SITE_OTHER): Admitting: Physician Assistant

## 2024-03-03 VITALS — BP 130/70 | HR 77 | Temp 98.7°F | Resp 16 | Ht 63.0 in | Wt 176.3 lb

## 2024-03-03 DIAGNOSIS — E538 Deficiency of other specified B group vitamins: Secondary | ICD-10-CM | POA: Diagnosis not present

## 2024-03-03 DIAGNOSIS — E66811 Obesity, class 1: Secondary | ICD-10-CM | POA: Diagnosis not present

## 2024-03-03 DIAGNOSIS — J301 Allergic rhinitis due to pollen: Secondary | ICD-10-CM | POA: Diagnosis not present

## 2024-03-03 DIAGNOSIS — F988 Other specified behavioral and emotional disorders with onset usually occurring in childhood and adolescence: Secondary | ICD-10-CM | POA: Diagnosis not present

## 2024-03-03 MED ORDER — CYANOCOBALAMIN 1000 MCG/ML IJ SOLN
1000.0000 ug | Freq: Once | INTRAMUSCULAR | Status: AC
  Administered 2024-03-03: 1000 ug via INTRAMUSCULAR

## 2024-03-03 MED ORDER — SEMAGLUTIDE-WEIGHT MANAGEMENT 0.5 MG/0.5ML ~~LOC~~ SOAJ: 0.5000 mg | SUBCUTANEOUS | 2 refills | Status: DC

## 2024-03-03 NOTE — Progress Notes (Signed)
 [facility] [address] [place], [place] [address]  Internal MEDICINE  Office Visit Note  Patient Name: Paula Sullivan  [id number]  [id number]  Date of Service: 03/03/2024  Chief Complaint  Patient presents with   Follow-up   Weight Loss    HPI Pt is here for routine follow up for weight management -Doing well with wegovy , a little nausea first night but then it resolves. Otherwise no S/E -Has done 2 months worth and lost 5 lbs since starting -interested in going up on the dose now and she noticed the appetite suppression isnt as noticeable now -doing well with adderall, refills already sent  Current Medication: Outpatient Encounter Medications as of 03/03/2024  Medication Sig   amphetamine -dextroamphetamine  (ADDERALL) 20 MG tablet Take 1 tablet (20 mg total) by mouth 2 (two) times daily.   [START ON 03/24/2024] amphetamine -dextroamphetamine  (ADDERALL) 20 MG tablet Take 1 tablet (20 mg total) by mouth 2 (two) times daily.   BIOTIN PO Take 1 capsule by mouth daily.   EPIPEN  2-PAK 0.3 MG/0.3ML SOAJ injection Inject 0.3 mg into the muscle as needed for anaphylaxis.   fluconazole  (DIFLUCAN ) 150 MG tablet Take 1 tablet (150 mg total) by mouth daily.   levocetirizine (XYZAL ) 5 MG tablet Take 1 tablet (5 mg total) by mouth every evening.   linaclotide  (LINZESS ) 145 MCG CAPS capsule Take 1 capsule (145 mcg total) by mouth daily before breakfast.   omeprazole  (PRILOSEC) 40 MG capsule Take 1 capsule (40 mg total) by mouth daily.   ondansetron  (ZOFRAN ) 4 MG tablet Take 1 tablet (4 mg total) by mouth every 8 (eight) hours as needed for nausea or vomiting.   Semaglutide -Weight Management 0.25 MG/0.5ML SOAJ Inject 0.25 mg into the skin once a week.   [EXPIRED] cyanocobalamin  (VITAMIN B12) injection 1,000 mcg    No facility-administered encounter medications on file as of 03/03/2024.    Surgical History: Past Surgical History:  Procedure Laterality Date   CHOLECYSTECTOMY   01/2005   LAPAROSCOPIC BILATERAL SALPINGECTOMY Bilateral 06/13/2021   Procedure: LAPAROSCOPIC BILATERAL SALPINGECTOMY;  Surgeon: Arloa Lamar SQUIBB, MD;  Location: [facility] OR[facility];  Service: Gynecology;  Laterality: Bilateral;   LAPAROSCOPIC VAGINAL HYSTERECTOMY WITH SALPINGO OOPHORECTOMY N/A 12/28/2023   Procedure: HYSTERECTOMY, VAGINAL, LAPAROSCOPY-ASSISTED, WITH SALPINGO-OOPHORECTOMY;  Surgeon: Janit Alm Agent, MD;  Location: [facility];  Service: Gynecology;  Laterality: N/A;  LAVH RSO   SLEEVE GASTROPLASTY  10/2020    Medical History: Past Medical History:  Diagnosis Date   ADHD    Anemia    Dysfunctional uterine bleeding 11/2023   PONV (postoperative nausea and vomiting)    Right ovarian cyst 11/2023   Type AB blood, Rh negative     Family History: Family History  Problem Relation Age of Onset   Suicidality Father        2011   Diabetes Maternal Grandfather    Cancer Mother 105       hyst--? cervical    Social History   Socioeconomic History   Marital status: Single    Spouse name: Not on file   Number of children: 1   Years of education: Not on file   Highest education level: Not on file  Occupational History   Not on file  Tobacco Use   Smoking status: Never   Smokeless tobacco: Never  Vaping Use   Vaping status: Never Used  Substance and Sexual Activity   Alcohol use: Yes    Comment: Rarely   Drug use: No  Sexual activity: Not Currently    Birth control/protection: Surgical    Comment: BTL/Hysterectomy 12/28/23  Other Topics Concern   Not on file  Social History Narrative   Not on file   Social Drivers of Health   Financial Resource Strain: Not on file  Food Insecurity: Not on file  Transportation Needs: Not on file  Physical Activity: Not on file  Stress: Not on file  Social Connections: Not on file  Intimate Partner Violence: Not on file      Review of Systems  Constitutional:  Negative for chills and unexpected weight change.  HENT:  Negative  for congestion, rhinorrhea, sneezing and sore throat.   Eyes:  Negative for redness.  Respiratory:  Negative for cough, chest tightness and shortness of breath.   Cardiovascular:  Negative for chest pain and palpitations.  Gastrointestinal:  Negative for constipation, diarrhea, nausea and vomiting.  Genitourinary:  Negative for dysuria and frequency.  Musculoskeletal:  Negative for arthralgias, back pain, joint swelling and neck pain.  Skin:  Negative for rash.  Neurological: Negative.  Negative for tremors and numbness.  Hematological:  Negative for adenopathy. Does not bruise/bleed easily.  Psychiatric/Behavioral:  Negative for behavioral problems (Depression), sleep disturbance and suicidal ideas. The patient is not nervous/anxious.     Vital Signs: BP 130/70   Pulse 77   Temp 98.7 F (37.1 C)   Resp 16   Ht 5' 3 (1.6 m)   Wt 176 lb 4.8 oz (80 kg)   LMP 12/14/2023 (Approximate)   SpO2 100%   BMI 31.23 kg/m    Physical Exam Vitals reviewed.  Constitutional:      General: She is not in acute distress.    Appearance: Normal appearance. She is not ill-appearing.  HENT:     Head: Normocephalic and atraumatic.  Eyes:     Extraocular Movements: Extraocular movements intact.  Cardiovascular:     Rate and Rhythm: Normal rate and regular rhythm.  Pulmonary:     Effort: Pulmonary effort is normal. No respiratory distress.  Skin:    General: Skin is warm and dry.  Neurological:     Mental Status: She is alert and oriented to person, place, and time.  Psychiatric:        Mood and Affect: Mood normal.        Behavior: Behavior normal.        Assessment/Plan: 1. Attention deficit disorder (ADD) in adult (Primary) Doing well with adderall and may continue, refills already available  2. Obesity (BMI 30.0-34.9) Down 5lbs since starting 2 months ago, ready to move up in dose and will send 5mg  dose now. Continue to work on diet and exercise - semaglutide -weight management  (WEGOVY ) 0.5 MG/0.5ML SOAJ SQ injection; Inject 0.5 mg into the skin once a week.  Dispense: 2 mL; Refill: 2  3. B12 deficiency - cyanocobalamin  (VITAMIN B12) injection 1,000 mcg   General Counseling: Kalaysia verbalizes understanding of the findings of todays visit and agrees with plan of treatment. I have discussed any further diagnostic evaluation that may be needed or ordered today. We also reviewed her medications today. she has been encouraged to call the office with any questions or concerns that should arise related to todays visit.    No orders of the defined types were placed in this encounter.   Meds ordered this encounter  Medications   cyanocobalamin  (VITAMIN B12) injection 1,000 mcg    This patient was seen by Tinnie Pro, PA-C in collaboration with Dr.  Sigrid Bathe as a part of collaborative care agreement.   Total time spent:30 Minutes Time spent includes review of chart, medications, test results, and follow up plan with the patient.      Dr Fozia M Khan Internal medicine

## 2024-03-10 DIAGNOSIS — J301 Allergic rhinitis due to pollen: Secondary | ICD-10-CM | POA: Diagnosis not present

## 2024-03-18 ENCOUNTER — Ambulatory Visit (INDEPENDENT_AMBULATORY_CARE_PROVIDER_SITE_OTHER): Admitting: Physician Assistant

## 2024-03-18 ENCOUNTER — Encounter: Payer: Self-pay | Admitting: Physician Assistant

## 2024-03-18 VITALS — BP 128/79 | HR 89 | Temp 97.8°F | Resp 16 | Ht 63.0 in | Wt 171.0 lb

## 2024-03-18 DIAGNOSIS — B029 Zoster without complications: Secondary | ICD-10-CM

## 2024-03-18 MED ORDER — VALACYCLOVIR HCL 1 G PO TABS
1000.0000 mg | ORAL_TABLET | Freq: Three times a day (TID) | ORAL | 0 refills | Status: AC
Start: 2024-03-18 — End: 2024-03-25

## 2024-03-18 MED ORDER — GABAPENTIN 100 MG PO CAPS
100.0000 mg | ORAL_CAPSULE | Freq: Three times a day (TID) | ORAL | 3 refills | Status: AC
Start: 2024-03-18 — End: ?

## 2024-03-18 NOTE — Progress Notes (Signed)
 [facility] [address] [place], [place] [address]  Internal MEDICINE  Office Visit Note  Patient Name: Paula Sullivan  [id number]  [id number]  Date of Service: 03/18/2024  Chief Complaint  Patient presents with   Acute Visit    Possible shingles      HPI Pt is here for a sick visit. -Wed night had nausea and felt clammy and then had scratch like spots on right arm. Then Thursday became very painful, burning stabbing pain down through right arm and more of a rash along this arm as well -tried mupirocin and benadryl  and scrubs and didn't help -initially thought poison ivy however looked different than in the past and the burning pain was inconsistent -she asked a friend who is a dermatologist and was suspicious of shingles and recommended she seek treatment  Current Medication:  Outpatient Encounter Medications as of 03/18/2024  Medication Sig   amphetamine -dextroamphetamine  (ADDERALL) 20 MG tablet Take 1 tablet (20 mg total) by mouth 2 (two) times daily.   [START ON 03/24/2024] amphetamine -dextroamphetamine  (ADDERALL) 20 MG tablet Take 1 tablet (20 mg total) by mouth 2 (two) times daily.   BIOTIN PO Take 1 capsule by mouth daily.   EPIPEN  2-PAK 0.3 MG/0.3ML SOAJ injection Inject 0.3 mg into the muscle as needed for anaphylaxis.   fluconazole  (DIFLUCAN ) 150 MG tablet Take 1 tablet (150 mg total) by mouth daily.   gabapentin  (NEURONTIN ) 100 MG capsule Take 1 capsule (100 mg total) by mouth 3 (three) times daily.   levocetirizine (XYZAL ) 5 MG tablet Take 1 tablet (5 mg total) by mouth every evening.   linaclotide  (LINZESS ) 145 MCG CAPS capsule Take 1 capsule (145 mcg total) by mouth daily before breakfast.   omeprazole  (PRILOSEC) 40 MG capsule Take 1 capsule (40 mg total) by mouth daily.   ondansetron  (ZOFRAN ) 4 MG tablet Take 1 tablet (4 mg total) by mouth every 8 (eight) hours as needed for nausea or vomiting.   semaglutide -weight management (WEGOVY ) 0.5 MG/0.5ML SOAJ  SQ injection Inject 0.5 mg into the skin once a week.   valACYclovir  (VALTREX ) 1000 MG tablet Take 1 tablet (1,000 mg total) by mouth 3 (three) times daily for 7 days.   No facility-administered encounter medications on file as of 03/18/2024.      Medical History: Past Medical History:  Diagnosis Date   ADHD    Anemia    Dysfunctional uterine bleeding 11/2023   PONV (postoperative nausea and vomiting)    Right ovarian cyst 11/2023   Type AB blood, Rh negative      Vital Signs: BP 128/79   Pulse 89   Temp 97.8 F (36.6 C)   Resp 16   Ht 5' 3 (1.6 m)   Wt 171 lb (77.6 kg)   LMP 12/14/2023 (Approximate)   SpO2 97%   BMI 30.29 kg/m    Review of Systems  Constitutional:  Negative for fatigue and fever.  HENT:  Negative for congestion, mouth sores and postnasal drip.   Respiratory:  Negative for cough.   Cardiovascular:  Negative for chest pain.  Genitourinary:  Negative for flank pain.  Skin:  Positive for rash.       Burning/stabbing pain with rash along right arm  Psychiatric/Behavioral: Negative.      Physical Exam Vitals reviewed.  Constitutional:      General: She is not in acute distress.    Appearance: Normal appearance. She is not ill-appearing.  HENT:     Head: Normocephalic  and atraumatic.  Eyes:     Extraocular Movements: Extraocular movements intact.  Cardiovascular:     Rate and Rhythm: Normal rate and regular rhythm.  Pulmonary:     Effort: Pulmonary effort is normal. No respiratory distress.  Skin:    General: Skin is warm and dry.     Findings: Rash present.     Comments: Rash along right arm  Neurological:     Mental Status: She is alert and oriented to person, place, and time.  Psychiatric:        Mood and Affect: Mood normal.        Behavior: Behavior normal.       Assessment/Plan: 1. Herpes zoster without complication (Primary) Will treat with antiviral and may use gabapentin  for pain as needed. Discussed this may cause  grogginess and to use caution, may need to increase dose if not helping pain and should call if any concerns - gabapentin  (NEURONTIN ) 100 MG capsule; Take 1 capsule (100 mg total) by mouth 3 (three) times daily.  Dispense: 90 capsule; Refill: 3 - valACYclovir  (VALTREX ) 1000 MG tablet; Take 1 tablet (1,000 mg total) by mouth 3 (three) times daily for 7 days.  Dispense: 21 tablet; Refill: 0   General Counseling: Atianna verbalizes understanding of the findings of todays visit and agrees with plan of treatment. I have discussed any further diagnostic evaluation that may be needed or ordered today. We also reviewed her medications today. she has been encouraged to call the office with any questions or concerns that should arise related to todays visit.    Counseling:    No orders of the defined types were placed in this encounter.   Meds ordered this encounter  Medications   gabapentin  (NEURONTIN ) 100 MG capsule    Sig: Take 1 capsule (100 mg total) by mouth 3 (three) times daily.    Dispense:  90 capsule    Refill:  3   valACYclovir  (VALTREX ) 1000 MG tablet    Sig: Take 1 tablet (1,000 mg total) by mouth 3 (three) times daily for 7 days.    Dispense:  21 tablet    Refill:  0    Time spent:25 Minutes

## 2024-03-21 ENCOUNTER — Encounter: Payer: Self-pay | Admitting: Nurse Practitioner

## 2024-03-21 ENCOUNTER — Ambulatory Visit (INDEPENDENT_AMBULATORY_CARE_PROVIDER_SITE_OTHER): Admitting: Nurse Practitioner

## 2024-03-21 VITALS — BP 128/84 | HR 93 | Temp 98.3°F | Resp 16 | Ht 63.0 in | Wt 173.6 lb

## 2024-03-21 DIAGNOSIS — L2989 Other pruritus: Secondary | ICD-10-CM

## 2024-03-21 DIAGNOSIS — B86 Scabies: Secondary | ICD-10-CM | POA: Diagnosis not present

## 2024-03-21 MED ORDER — PERMETHRIN 5 % EX CREA: 1.0000 | TOPICAL_CREAM | Freq: Once | CUTANEOUS | 0 refills | Status: AC

## 2024-03-21 MED ORDER — IVERMECTIN 3 MG PO TABS: 200.0000 ug/kg | ORAL_TABLET | Freq: Every day | ORAL | 0 refills | Status: DC

## 2024-03-21 NOTE — Progress Notes (Signed)
 [facility] [address] [place], [place] [address]  Internal MEDICINE  Office Visit Note  Patient Name: Paula Sullivan  [id number]  [id number]  Date of Service: 03/21/2024  Chief Complaint  Patient presents with   Acute Visit    Rash spreading over body.      HPI Paula Sullivan presents for an acute sick visit for red itchy rash. --onset of rash was last week, she was seen on 9/5 by her PCP and it was thought that the initial rash may be shingles due to the itching and burning she was experiencing.  Now the rash has spread across her body to the other arm, on her chest as well.      Current Medication:  Outpatient Encounter Medications as of 03/21/2024  Medication Sig   ivermectin  (STROMECTOL ) 3 MG TABS tablet Take 5 tablets (15,000 mcg total) by mouth daily. On days 1, 2, 8, 9, 15, 22 and 29, then stop.   permethrin  (ELIMITE ) 5 % cream Apply 1 Application topically once for 1 dose. Massage 5% topical cream into the skin from the head to the soles of the feet. Wash off after 8 to 14 hours.   amphetamine -dextroamphetamine  (ADDERALL) 20 MG tablet Take 1 tablet (20 mg total) by mouth 2 (two) times daily.   [START ON 03/24/2024] amphetamine -dextroamphetamine  (ADDERALL) 20 MG tablet Take 1 tablet (20 mg total) by mouth 2 (two) times daily.   BIOTIN PO Take 1 capsule by mouth daily.   EPIPEN  2-PAK 0.3 MG/0.3ML SOAJ injection Inject 0.3 mg into the muscle as needed for anaphylaxis.   fluconazole  (DIFLUCAN ) 150 MG tablet Take 1 tablet (150 mg total) by mouth daily.   gabapentin  (NEURONTIN ) 100 MG capsule Take 1 capsule (100 mg total) by mouth 3 (three) times daily.   levocetirizine (XYZAL ) 5 MG tablet Take 1 tablet (5 mg total) by mouth every evening.   linaclotide  (LINZESS ) 145 MCG CAPS capsule Take 1 capsule (145 mcg total) by mouth daily before breakfast.   omeprazole  (PRILOSEC) 40 MG capsule Take 1 capsule (40 mg total) by mouth daily.   ondansetron  (ZOFRAN ) 4 MG tablet Take 1  tablet (4 mg total) by mouth every 8 (eight) hours as needed for nausea or vomiting.   semaglutide -weight management (WEGOVY ) 0.5 MG/0.5ML SOAJ SQ injection Inject 0.5 mg into the skin once a week.   valACYclovir  (VALTREX ) 1000 MG tablet Take 1 tablet (1,000 mg total) by mouth 3 (three) times daily for 7 days.   No facility-administered encounter medications on file as of 03/21/2024.      Medical History: Past Medical History:  Diagnosis Date   ADHD    Anemia    Dysfunctional uterine bleeding 11/2023   PONV (postoperative nausea and vomiting)    Right ovarian cyst 11/2023   Type AB blood, Rh negative      Vital Signs: BP 128/84   Pulse 93   Temp 98.3 F (36.8 C)   Resp 16   Ht 5' 3 (1.6 m)   Wt 173 lb 9.6 oz (78.7 kg)   LMP 12/14/2023 (Approximate)   SpO2 92%   BMI 30.75 kg/m    Review of Systems  Constitutional:  Negative for fatigue and fever.  HENT:  Negative for congestion, mouth sores and postnasal drip.   Respiratory:  Negative for cough.   Cardiovascular:  Negative for chest pain.  Genitourinary:  Negative for flank pain.  Skin:  Positive for rash (rash has spread down the right arm, right  chest, and left arm.).       Burning/stabbing pain with rash along right arm, also very itchy  Psychiatric/Behavioral: Negative.      Physical Exam Vitals reviewed.  Constitutional:      General: She is not in acute distress.    Appearance: Normal appearance. She is obese. She is not ill-appearing.  HENT:     Head: Normocephalic and atraumatic.  Eyes:     Pupils: Pupils are equal, round, and reactive to light.  Cardiovascular:     Rate and Rhythm: Normal rate and regular rhythm.  Pulmonary:     Effort: Pulmonary effort is normal. No respiratory distress.  Skin:    Findings: Rash present. Rash is macular and papular.     Comments: Linear maculopapular rash with scabs that look like entry points and tracks -- concern for scabies.  Neurological:     Mental Status:  She is alert and oriented to person, place, and time.  Psychiatric:        Mood and Affect: Mood normal.        Behavior: Behavior normal.       Assessment/Plan: 1. Scabies Take medication as prescribed to treat scabies rash.  - ivermectin  (STROMECTOL ) 3 MG TABS tablet; Take 5 tablets (15,000 mcg total) by mouth daily. On days 1, 2, 8, 9, 15, 22 and 29, then stop.  Dispense: 35 tablet; Refill: 0 - permethrin  (ELIMITE ) 5 % cream; Apply 1 Application topically once for 1 dose. Massage 5% topical cream into the skin from the head to the soles of the feet. Wash off after 8 to 14 hours.  Dispense: 60 g; Refill: 0  2. Pruritic erythematous rash (Primary) Take medication as prescribed.  - ivermectin  (STROMECTOL ) 3 MG TABS tablet; Take 5 tablets (15,000 mcg total) by mouth daily. On days 1, 2, 8, 9, 15, 22 and 29, then stop.  Dispense: 35 tablet; Refill: 0 - permethrin  (ELIMITE ) 5 % cream; Apply 1 Application topically once for 1 dose. Massage 5% topical cream into the skin from the head to the soles of the feet. Wash off after 8 to 14 hours.  Dispense: 60 g; Refill: 0   General Counseling: Paula Sullivan verbalizes understanding of the findings of todays visit and agrees with plan of treatment. I have discussed any further diagnostic evaluation that may be needed or ordered today. We also reviewed her medications today. she has been encouraged to call the office with any questions or concerns that should arise related to todays visit.    Counseling:    No orders of the defined types were placed in this encounter.   Meds ordered this encounter  Medications   ivermectin  (STROMECTOL ) 3 MG TABS tablet    Sig: Take 5 tablets (15,000 mcg total) by mouth daily. On days 1, 2, 8, 9, 15, 22 and 29, then stop.    Dispense:  35 tablet    Refill:  0    Fill new script today asap.   permethrin  (ELIMITE ) 5 % cream    Sig: Apply 1 Application topically once for 1 dose. Massage 5% topical cream into the skin  from the head to the soles of the feet. Wash off after 8 to 14 hours.    Dispense:  60 g    Refill:  0    Fill new script today asap    Return if symptoms worsen or fail to improve.  [place] Controlled Substance Database was reviewed by me for overdose risk score (ORS)  Time spent:20 Minutes Time spent with patient included reviewing progress notes, labs, imaging studies, and discussing plan for follow up.   This patient was seen by Mardy Maxin, FNP-C in collaboration with Dr. Sigrid Bathe as a part of collaborative care agreement.  Macaulay Reicher R. Maxin, MSN, FNP-C Internal Medicine

## 2024-03-30 ENCOUNTER — Ambulatory Visit

## 2024-03-31 ENCOUNTER — Encounter: Payer: Self-pay | Admitting: Physician Assistant

## 2024-03-31 ENCOUNTER — Ambulatory Visit (INDEPENDENT_AMBULATORY_CARE_PROVIDER_SITE_OTHER): Admitting: Physician Assistant

## 2024-03-31 VITALS — BP 112/80 | HR 82 | Temp 98.5°F | Resp 16 | Ht 63.0 in | Wt 165.8 lb

## 2024-03-31 DIAGNOSIS — Z23 Encounter for immunization: Secondary | ICD-10-CM

## 2024-03-31 DIAGNOSIS — E538 Deficiency of other specified B group vitamins: Secondary | ICD-10-CM

## 2024-03-31 DIAGNOSIS — E663 Overweight: Secondary | ICD-10-CM

## 2024-03-31 DIAGNOSIS — L2989 Other pruritus: Secondary | ICD-10-CM

## 2024-03-31 MED ORDER — CYANOCOBALAMIN 1000 MCG/ML IJ SOLN
1000.0000 ug | Freq: Once | INTRAMUSCULAR | Status: AC
  Administered 2024-03-31: 1000 ug via INTRAMUSCULAR

## 2024-03-31 NOTE — Progress Notes (Signed)
 [facility] [address] [place], [place] [address]  Internal MEDICINE  Office Visit Note  Patient Name: Paula Sullivan  [id number]  [id number]  Date of Service: 03/31/2024  Chief Complaint  Patient presents with   Follow-up   Weight Loss    HPI Pt is here for routine follow up -rash is better now, some residual marks still and slightly itchy but improving -states she doesn't think son had the same thing. He was still evaluated and treated -Down 11 lbs in 1 month -tolerating wegovy , not as nauseous now. Would like to continue same does -states goal wt of 145-150lbs  Current Medication: Outpatient Encounter Medications as of 03/31/2024  Medication Sig   amphetamine -dextroamphetamine  (ADDERALL) 20 MG tablet Take 1 tablet (20 mg total) by mouth 2 (two) times daily.   amphetamine -dextroamphetamine  (ADDERALL) 20 MG tablet Take 1 tablet (20 mg total) by mouth 2 (two) times daily.   BIOTIN PO Take 1 capsule by mouth daily.   EPIPEN  2-PAK 0.3 MG/0.3ML SOAJ injection Inject 0.3 mg into the muscle as needed for anaphylaxis.   fluconazole  (DIFLUCAN ) 150 MG tablet Take 1 tablet (150 mg total) by mouth daily.   gabapentin  (NEURONTIN ) 100 MG capsule Take 1 capsule (100 mg total) by mouth 3 (three) times daily.   ivermectin  (STROMECTOL ) 3 MG TABS tablet Take 5 tablets (15,000 mcg total) by mouth daily. On days 1, 2, 8, 9, 15, 22 and 29, then stop.   levocetirizine (XYZAL ) 5 MG tablet Take 1 tablet (5 mg total) by mouth every evening.   linaclotide  (LINZESS ) 145 MCG CAPS capsule Take 1 capsule (145 mcg total) by mouth daily before breakfast.   omeprazole  (PRILOSEC) 40 MG capsule Take 1 capsule (40 mg total) by mouth daily.   ondansetron  (ZOFRAN ) 4 MG tablet Take 1 tablet (4 mg total) by mouth every 8 (eight) hours as needed for nausea or vomiting.   semaglutide -weight management (WEGOVY ) 0.5 MG/0.5ML SOAJ SQ injection Inject 0.5 mg into the skin once a week.   [EXPIRED]  cyanocobalamin  (VITAMIN B12) injection 1,000 mcg    No facility-administered encounter medications on file as of 03/31/2024.    Surgical History: Past Surgical History:  Procedure Laterality Date   CHOLECYSTECTOMY  01/2005   LAPAROSCOPIC BILATERAL SALPINGECTOMY Bilateral 06/13/2021   Procedure: LAPAROSCOPIC BILATERAL SALPINGECTOMY;  Surgeon: Arloa Lamar SQUIBB, MD;  Location: [facility];  Service: Gynecology;  Laterality: Bilateral;   LAPAROSCOPIC VAGINAL HYSTERECTOMY WITH SALPINGO OOPHORECTOMY N/A 12/28/2023   Procedure: HYSTERECTOMY, VAGINAL, LAPAROSCOPY-ASSISTED, WITH SALPINGO-OOPHORECTOMY;  Surgeon: Janit Alm Agent, MD;  Location: [facility];  Service: Gynecology;  Laterality: N/A;  LAVH RSO   SLEEVE GASTROPLASTY  10/2020    Medical History: Past Medical History:  Diagnosis Date   ADHD    Anemia    Dysfunctional uterine bleeding 11/2023   PONV (postoperative nausea and vomiting)    Right ovarian cyst 11/2023   Type AB blood, Rh negative     Family History: Family History  Problem Relation Age of Onset   Suicidality Father        2011   Diabetes Maternal Grandfather    Cancer Mother 39       hyst--? cervical    Social History   Socioeconomic History   Marital status: Single    Spouse name: Not on file   Number of children: 1   Years of education: Not on file   Highest education level: Not on file  Occupational History   Not on file  Tobacco Use   Smoking status: Never   Smokeless tobacco: Never  Vaping Use   Vaping status: Never Used  Substance and Sexual Activity   Alcohol use: Yes    Comment: Rarely   Drug use: No   Sexual activity: Not Currently    Birth control/protection: Surgical    Comment: BTL/Hysterectomy 12/28/23  Other Topics Concern   Not on file  Social History Narrative   Not on file   Social Drivers of Health   Financial Resource Strain: Not on file  Food Insecurity: Not on file  Transportation Needs: Not on file  Physical Activity: Not  on file  Stress: Not on file  Social Connections: Not on file  Intimate Partner Violence: Not on file      Review of Systems  Constitutional:  Negative for chills and unexpected weight change.  HENT:  Negative for congestion, rhinorrhea, sneezing and sore throat.   Eyes:  Negative for redness.  Respiratory:  Negative for cough, chest tightness and shortness of breath.   Cardiovascular:  Negative for chest pain and palpitations.  Gastrointestinal:  Negative for constipation, diarrhea, nausea and vomiting.  Genitourinary:  Negative for dysuria and frequency.  Musculoskeletal:  Negative for arthralgias, back pain, joint swelling and neck pain.  Skin:  Negative for rash.  Neurological: Negative.  Negative for tremors and numbness.  Hematological:  Negative for adenopathy. Does not bruise/bleed easily.  Psychiatric/Behavioral:  Negative for behavioral problems (Depression), sleep disturbance and suicidal ideas. The patient is not nervous/anxious.     Vital Signs: BP 112/80   Pulse 82   Temp 98.5 F (36.9 C)   Resp 16   Ht 5' 3 (1.6 m)   Wt 165 lb 12.8 oz (75.2 kg)   LMP 12/14/2023 (Approximate)   SpO2 99%   BMI 29.37 kg/m    Physical Exam Vitals reviewed.  Constitutional:      General: She is not in acute distress.    Appearance: Normal appearance. She is not ill-appearing.  HENT:     Head: Normocephalic and atraumatic.  Eyes:     Extraocular Movements: Extraocular movements intact.  Cardiovascular:     Rate and Rhythm: Normal rate and regular rhythm.  Pulmonary:     Effort: Pulmonary effort is normal. No respiratory distress.  Skin:    General: Skin is warm and dry.     Comments: Rash improving, some marks fading on right arm still  Neurological:     Mental Status: She is alert and oriented to person, place, and time.  Psychiatric:        Mood and Affect: Mood normal.        Behavior: Behavior normal.        Assessment/Plan: 1. Pruritic erythematous rash  (Primary) Improving   2. Overweight (BMI 25.0-29.9) Down 11lbs and may continue wegovy  as before  3. B12 deficiency - cyanocobalamin  (VITAMIN B12) injection 1,000 mcg  4. Flu vaccine need - Influenza, MDCK, trivalent, PF(Flucelvax egg-free)    General Counseling: Breanah verbalizes understanding of the findings of todays visit and agrees with plan of treatment. I have discussed any further diagnostic evaluation that may be needed or ordered today. We also reviewed her medications today. she has been encouraged to call the office with any questions or concerns that should arise related to todays visit.    Orders Placed This Encounter  Procedures   Influenza, MDCK, trivalent, PF(Flucelvax egg-free)    Meds ordered this encounter  Medications   cyanocobalamin  (VITAMIN  B12) injection 1,000 mcg    This patient was seen by Tinnie Pro, PA-C in collaboration with Dr. Sigrid Bathe as a part of collaborative care agreement.   Total time spent:30 Minutes Time spent includes review of chart, medications, test results, and follow up plan with the patient.      Dr Fozia M Khan Internal medicine

## 2024-04-07 DIAGNOSIS — J301 Allergic rhinitis due to pollen: Secondary | ICD-10-CM | POA: Diagnosis not present

## 2024-04-14 DIAGNOSIS — J301 Allergic rhinitis due to pollen: Secondary | ICD-10-CM | POA: Diagnosis not present

## 2024-04-21 ENCOUNTER — Encounter: Payer: Self-pay | Admitting: Physician Assistant

## 2024-04-21 ENCOUNTER — Other Ambulatory Visit: Payer: Self-pay | Admitting: Physician Assistant

## 2024-04-21 DIAGNOSIS — E66811 Obesity, class 1: Secondary | ICD-10-CM

## 2024-04-21 DIAGNOSIS — J301 Allergic rhinitis due to pollen: Secondary | ICD-10-CM | POA: Diagnosis not present

## 2024-04-21 DIAGNOSIS — F988 Other specified behavioral and emotional disorders with onset usually occurring in childhood and adolescence: Secondary | ICD-10-CM

## 2024-04-21 MED ORDER — AMPHETAMINE-DEXTROAMPHETAMINE 20 MG PO TABS: 20.0000 mg | ORAL_TABLET | Freq: Two times a day (BID) | ORAL | 0 refills | Status: DC

## 2024-04-21 MED ORDER — SEMAGLUTIDE-WEIGHT MANAGEMENT 0.5 MG/0.5ML ~~LOC~~ SOAJ: 0.5000 mg | SUBCUTANEOUS | 2 refills | Status: DC

## 2024-04-24 ENCOUNTER — Other Ambulatory Visit: Payer: Self-pay | Admitting: Medical Genetics

## 2024-04-24 DIAGNOSIS — Z006 Encounter for examination for normal comparison and control in clinical research program: Secondary | ICD-10-CM

## 2024-04-28 ENCOUNTER — Ambulatory Visit: Admitting: Physician Assistant

## 2024-04-28 DIAGNOSIS — J301 Allergic rhinitis due to pollen: Secondary | ICD-10-CM | POA: Diagnosis not present

## 2024-05-02 ENCOUNTER — Encounter: Payer: Self-pay | Admitting: Physician Assistant

## 2024-05-04 ENCOUNTER — Telehealth: Payer: Self-pay | Admitting: Physician Assistant

## 2024-05-04 ENCOUNTER — Ambulatory Visit: Admitting: Obstetrics and Gynecology

## 2024-05-04 ENCOUNTER — Ambulatory Visit: Admitting: Obstetrics & Gynecology

## 2024-05-04 NOTE — Telephone Encounter (Signed)
 Per patient's request, proof of flu shot emailed to her-[name]

## 2024-05-09 ENCOUNTER — Encounter: Payer: Self-pay | Admitting: Physician Assistant

## 2024-05-10 ENCOUNTER — Telehealth: Payer: Self-pay

## 2024-05-10 NOTE — Telephone Encounter (Signed)
Completed P.A. for patient's ZOXWRU.

## 2024-05-11 NOTE — Telephone Encounter (Signed)
 Done

## 2024-05-12 DIAGNOSIS — J301 Allergic rhinitis due to pollen: Secondary | ICD-10-CM | POA: Diagnosis not present

## 2024-05-18 DIAGNOSIS — L299 Pruritus, unspecified: Secondary | ICD-10-CM | POA: Diagnosis not present

## 2024-05-18 DIAGNOSIS — J301 Allergic rhinitis due to pollen: Secondary | ICD-10-CM | POA: Diagnosis not present

## 2024-05-19 DIAGNOSIS — K5904 Chronic idiopathic constipation: Secondary | ICD-10-CM | POA: Diagnosis not present

## 2024-05-19 DIAGNOSIS — K649 Unspecified hemorrhoids: Secondary | ICD-10-CM | POA: Diagnosis not present

## 2024-05-24 ENCOUNTER — Other Ambulatory Visit: Payer: Self-pay | Admitting: Physician Assistant

## 2024-05-24 DIAGNOSIS — K219 Gastro-esophageal reflux disease without esophagitis: Secondary | ICD-10-CM

## 2024-05-25 DIAGNOSIS — J301 Allergic rhinitis due to pollen: Secondary | ICD-10-CM | POA: Diagnosis not present

## 2024-05-26 DIAGNOSIS — J301 Allergic rhinitis due to pollen: Secondary | ICD-10-CM | POA: Diagnosis not present

## 2024-06-16 ENCOUNTER — Other Ambulatory Visit: Payer: Self-pay | Admitting: Physician Assistant

## 2024-06-16 ENCOUNTER — Encounter: Payer: Self-pay | Admitting: Physician Assistant

## 2024-06-16 DIAGNOSIS — J301 Allergic rhinitis due to pollen: Secondary | ICD-10-CM | POA: Diagnosis not present

## 2024-06-16 DIAGNOSIS — F988 Other specified behavioral and emotional disorders with onset usually occurring in childhood and adolescence: Secondary | ICD-10-CM

## 2024-06-16 MED ORDER — AMPHETAMINE-DEXTROAMPHETAMINE 20 MG PO TABS: 20.0000 mg | ORAL_TABLET | Freq: Two times a day (BID) | ORAL | 0 refills | Status: DC

## 2024-06-23 ENCOUNTER — Telehealth: Payer: Self-pay

## 2024-06-23 DIAGNOSIS — J301 Allergic rhinitis due to pollen: Secondary | ICD-10-CM | POA: Diagnosis not present

## 2024-06-23 NOTE — Telephone Encounter (Signed)
 Faxed patient's Wegovy  appeal.

## 2024-06-30 ENCOUNTER — Ambulatory Visit: Admitting: Nurse Practitioner

## 2024-07-18 ENCOUNTER — Encounter: Payer: Self-pay | Admitting: Physician Assistant

## 2024-07-18 ENCOUNTER — Ambulatory Visit: Admitting: Physician Assistant

## 2024-07-18 VITALS — BP 105/70 | HR 84 | Temp 98.0°F | Resp 16 | Ht 63.0 in | Wt 165.8 lb

## 2024-07-18 DIAGNOSIS — F988 Other specified behavioral and emotional disorders with onset usually occurring in childhood and adolescence: Secondary | ICD-10-CM

## 2024-07-18 DIAGNOSIS — E663 Overweight: Secondary | ICD-10-CM

## 2024-07-18 DIAGNOSIS — Z79899 Other long term (current) drug therapy: Secondary | ICD-10-CM

## 2024-07-18 LAB — POCT URINE DRUG SCREEN
Methylenedioxyamphetamine: NOT DETECTED
POC Amphetamine UR: POSITIVE — AB
POC BENZODIAZEPINES UR: NOT DETECTED
POC Barbiturate UR: NOT DETECTED
POC Cocaine UR: NOT DETECTED
POC Ecstasy UR: NOT DETECTED
POC Marijuana UR: NOT DETECTED
POC Methadone UR: NOT DETECTED
POC Methamphetamine UR: NOT DETECTED
POC Opiate Ur: NOT DETECTED
POC Oxycodone UR: NOT DETECTED
POC PHENCYCLIDINE UR: NOT DETECTED
POC TRICYCLICS UR: NOT DETECTED

## 2024-07-18 MED ORDER — AMPHETAMINE-DEXTROAMPHETAMINE 20 MG PO TABS: 20.0000 mg | ORAL_TABLET | Freq: Two times a day (BID) | ORAL | 0 refills | Status: AC

## 2024-07-18 MED ORDER — AMPHETAMINE-DEXTROAMPHETAMINE 20 MG PO TABS: 20.0000 mg | ORAL_TABLET | Freq: Two times a day (BID) | ORAL | 0 refills | Status: DC

## 2024-07-18 NOTE — Progress Notes (Signed)
 [facility] [address] [place], [place] [address]  Internal MEDICINE  Office Visit Note  Patient Name: Paula Sullivan  [id number]  [id number]  Date of Service: 07/18/2024  Chief Complaint  Patient presents with   Follow-up   Medication Refill   Quality Metric Gaps    Mammogram    HPI Pt is here for routine follow up -insurance stopped covering wegovy , but has maintained weight loss since last visit -was sick recently, whole family got sick, but better now -CPE next visit -thinks rash was from poison [place] after all despite abnormal appearance, because she was outside and got it again recently on arm/eye and had to go to urgent care for prednisone . States it just looked different without leaves present and didn't recognize it. -doing well with adderall and needs refills  Current Medication: Outpatient Encounter Medications as of 07/18/2024  Medication Sig   amphetamine -dextroamphetamine  (ADDERALL) 20 MG tablet Take 1 tablet (20 mg total) by mouth 2 (two) times daily.   amphetamine -dextroamphetamine  (ADDERALL) 20 MG tablet Take 1 tablet (20 mg total) by mouth 2 (two) times daily.   BIOTIN PO Take 1 capsule by mouth daily.   EPIPEN  2-PAK 0.3 MG/0.3ML SOAJ injection Inject 0.3 mg into the muscle as needed for anaphylaxis.   gabapentin  (NEURONTIN ) 100 MG capsule Take 1 capsule (100 mg total) by mouth 3 (three) times daily.   levocetirizine (XYZAL ) 5 MG tablet Take 1 tablet (5 mg total) by mouth every evening.   linaclotide  (LINZESS ) 145 MCG CAPS capsule Take 1 capsule (145 mcg total) by mouth daily before breakfast.   omeprazole  (PRILOSEC) 40 MG capsule TAKE 1 CAPSULE (40 MG TOTAL) BY MOUTH DAILY.   [DISCONTINUED] fluconazole  (DIFLUCAN ) 150 MG tablet Take 1 tablet (150 mg total) by mouth daily.   [DISCONTINUED] ivermectin  (STROMECTOL ) 3 MG TABS tablet Take 5 tablets (15,000 mcg total) by mouth daily. On days 1, 2, 8, 9, 15, 22 and 29, then stop.   [DISCONTINUED]  ondansetron  (ZOFRAN ) 4 MG tablet Take 1 tablet (4 mg total) by mouth every 8 (eight) hours as needed for nausea or vomiting.   [DISCONTINUED] semaglutide -weight management (WEGOVY ) 0.5 MG/0.5ML SOAJ SQ injection Inject 0.5 mg into the skin once a week.   No facility-administered encounter medications on file as of 07/18/2024.    Surgical History: Past Surgical History:  Procedure Laterality Date   CHOLECYSTECTOMY  01/2005   LAPAROSCOPIC BILATERAL SALPINGECTOMY Bilateral 06/13/2021   Procedure: LAPAROSCOPIC BILATERAL SALPINGECTOMY;  Surgeon: Arloa Lamar SQUIBB, MD;  Location: [facility];  Service: Gynecology;  Laterality: Bilateral;   LAPAROSCOPIC VAGINAL HYSTERECTOMY WITH SALPINGO OOPHORECTOMY N/A 12/28/2023   Procedure: HYSTERECTOMY, VAGINAL, LAPAROSCOPY-ASSISTED, WITH SALPINGO-OOPHORECTOMY;  Surgeon: Janit Alm Agent, MD;  Location: [facility] OR[facility];  Service: Gynecology;  Laterality: N/A;  LAVH RSO   SLEEVE GASTROPLASTY  10/2020    Medical History: Past Medical History:  Diagnosis Date   ADHD    Anemia    Dysfunctional uterine bleeding 11/2023   PONV (postoperative nausea and vomiting)    Right ovarian cyst 11/2023   Type AB blood, Rh negative     Family History: Family History  Problem Relation Age of Onset   Suicidality Father        2011   Diabetes Maternal Grandfather    Cancer Mother 49       hyst--? cervical    Social History   Socioeconomic History   Marital status: Single    Spouse name: Not on file   Number  of children: 1   Years of education: Not on file   Highest education level: Not on file  Occupational History   Not on file  Tobacco Use   Smoking status: Never   Smokeless tobacco: Never  Vaping Use   Vaping status: Never Used  Substance and Sexual Activity   Alcohol use: Yes    Comment: Rarely   Drug use: No   Sexual activity: Not Currently    Birth control/protection: Surgical    Comment: BTL/Hysterectomy 12/28/23  Other Topics Concern   Not on file   Social History Narrative   Not on file   Social Drivers of Health   Tobacco Use: Low Risk (07/18/2024)   Patient History    Smoking Tobacco Use: Never    Smokeless Tobacco Use: Never    Passive Exposure: Not on file  Financial Resource Strain: Low Risk  (05/19/2024)   Received from [facility]   Overall Financial Resource Strain (CARDIA)    Difficulty of Paying Living Expenses: Not hard at all  Food Insecurity: No Food Insecurity (05/19/2024)   Received from [facility]   Epic    Within the past 12 months, you worried that your food would run out before you got the money to buy more.: Never true    Within the past 12 months, the food you bought just didn't last and you didn't have money to get more.: Never true  Transportation Needs: No Transportation Needs (05/19/2024)   Received from [facility] - Transportation    In the past 12 months, has lack of transportation kept you from medical appointments or from getting medications?: No    Lack of Transportation (Non-Medical): No  Physical Activity: Not on file  Stress: Not on file  Social Connections: Not on file  Intimate Partner Violence: Not on file  Depression (PHQ2-9): Low Risk (07/18/2024)   Depression (PHQ2-9)    PHQ-2 Score: 0  Alcohol Screen: Low Risk (03/03/2024)   Alcohol Screen    Last Alcohol Screening Score (AUDIT): 2  Housing: Low Risk  (05/19/2024)   Received from [facility]   Epic    In the last 12 months, was there a time when you were not able to pay the mortgage or rent on time?: No    In the past 12 months, how many times have you moved where you were living?: 0    At any time in the past 12 months, were you homeless or living in a shelter (including now)?: No  Utilities: Not At Risk (05/19/2024)   Received from [facility]   Epic    In the past 12 months has the electric, gas, oil, or water company threatened to  shut off services in your home?: No  Health Literacy: Not on file      Review of Systems  Constitutional:  Negative for chills and unexpected weight change.  HENT:  Negative for congestion, rhinorrhea, sneezing and sore throat.   Eyes:  Negative for redness.  Respiratory:  Negative for cough, chest tightness and shortness of breath.   Cardiovascular:  Negative for chest pain and palpitations.  Gastrointestinal:  Negative for constipation, diarrhea, nausea and vomiting.  Genitourinary:  Negative for dysuria and frequency.  Musculoskeletal:  Negative for arthralgias, back pain, joint swelling and neck pain.  Skin:  Negative for rash.  Neurological: Negative.  Negative for tremors and numbness.  Hematological:  Negative for  adenopathy. Does not bruise/bleed easily.  Psychiatric/Behavioral:  Negative for behavioral problems (Depression), sleep disturbance and suicidal ideas. The patient is not nervous/anxious.     Vital Signs: BP 105/70   Pulse 84   Temp 98 F (36.7 C)   Resp 16   Ht 5' 3 (1.6 m)   Wt 165 lb 12.8 oz (75.2 kg)   LMP 12/14/2023   SpO2 98%   BMI 29.37 kg/m    Physical Exam Vitals reviewed.  Constitutional:      General: She is not in acute distress.    Appearance: Normal appearance. She is not ill-appearing.  HENT:     Head: Normocephalic and atraumatic.  Eyes:     Extraocular Movements: Extraocular movements intact.  Cardiovascular:     Rate and Rhythm: Normal rate and regular rhythm.  Pulmonary:     Effort: Pulmonary effort is normal. No respiratory distress.  Skin:    General: Skin is warm and dry.  Neurological:     Mental Status: She is alert and oriented to person, place, and time.  Psychiatric:        Mood and Affect: Mood normal.        Behavior: Behavior normal.        Assessment/Plan: 1. Attention deficit disorder (ADD) in adult (Primary) May continue adderall as before - amphetamine -dextroamphetamine  (ADDERALL) 20 MG tablet; Take 1  tablet (20 mg total) by mouth 2 (two) times daily.  Dispense: 60 tablet; Refill: 0 - amphetamine -dextroamphetamine  (ADDERALL) 20 MG tablet; Take 1 tablet (20 mg total) by mouth 2 (two) times daily.  Dispense: 60 tablet; Refill: 0 - amphetamine -dextroamphetamine  (ADDERALL) 20 MG tablet; Take 1 tablet (20 mg total) by mouth 2 (two) times daily.  Dispense: 60 tablet; Refill: 0 [place] Controlled Substance Database was reviewed by me for overdose risk score (ORS) Refilled Controlled medications today. Reviewed risks and possible side effects associated with taking Stimulants. Combination of these drugs with other psychotropic medications could cause dizziness and drowsiness. Pt needs to Monitor symptoms and exercise caution in driving and operating heavy machinery to avoid damages to oneself, to others and to the surroundings. Patient verbalized understanding in this matter. Dependence and abuse for these drugs will be monitored closely. A Controlled substance policy and procedure is on file which allows [facility] to order a urine drug screen test at any visit. Patient understands and agrees with the plan..  2. Encounter for long-term (current) use of medications - POCT Urine Drug Screen  3. Overweight (BMI 25.0-29.9) Continue to work on diet and exercise   General Counseling: Jestina verbalizes understanding of the findings of todays visit and agrees with plan of treatment. I have discussed any further diagnostic evaluation that may be needed or ordered today. We also reviewed her medications today. she has been encouraged to call the office with any questions or concerns that should arise related to todays visit.    Orders Placed This Encounter  Procedures   POCT Urine Drug Screen    No orders of the defined types were placed in this encounter.   This patient was seen by Tinnie Pro, PA-C in collaboration with Dr. Sigrid Bathe as a part of collaborative care agreement.   Total time  spent: 30 Minutes Time spent includes review of chart, medications, test results, and follow up plan with the patient.      Dr Fozia M Khan Internal medicine

## 2024-07-21 ENCOUNTER — Telehealth: Payer: Self-pay

## 2024-07-21 NOTE — Telephone Encounter (Signed)
 Completed new P.A. for patient's Wegovy .

## 2024-08-10 ENCOUNTER — Telehealth: Payer: Self-pay

## 2024-08-11 ENCOUNTER — Other Ambulatory Visit: Payer: Self-pay

## 2024-08-11 MED ORDER — WEGOVY 0.5 MG/0.5ML ~~LOC~~ SOAJ: 0.5000 mg | SUBCUTANEOUS | 1 refills | Status: AC

## 2024-08-12 NOTE — Telephone Encounter (Signed)
 done

## 2024-09-29 ENCOUNTER — Encounter: Admitting: Physician Assistant
